# Patient Record
Sex: Female | Born: 1955 | Race: White | Hispanic: No | Smoking: Former smoker
Health system: Southern US, Community
[De-identification: ages and names within clinical notes are randomized; demographics above are authoritative.]

## PROBLEM LIST (undated history)

## (undated) ENCOUNTER — Emergency Department: Disposition: A | Discharge status: Home / Self Care | Payer: Self-pay

## (undated) DIAGNOSIS — I351 Nonrheumatic aortic (valve) insufficiency: Secondary | ICD-10-CM

## (undated) DIAGNOSIS — M1990 Unspecified osteoarthritis, unspecified site: Secondary | ICD-10-CM

## (undated) DIAGNOSIS — I517 Cardiomegaly: Secondary | ICD-10-CM

## (undated) DIAGNOSIS — I10 Essential (primary) hypertension: Secondary | ICD-10-CM

## (undated) DIAGNOSIS — M545 Low back pain, unspecified: Secondary | ICD-10-CM

## (undated) DIAGNOSIS — M48061 Spinal stenosis, lumbar region without neurogenic claudication: Secondary | ICD-10-CM

## (undated) DIAGNOSIS — Z720 Tobacco use: Secondary | ICD-10-CM

## (undated) DIAGNOSIS — M797 Fibromyalgia: Secondary | ICD-10-CM

## (undated) DIAGNOSIS — E785 Hyperlipidemia, unspecified: Secondary | ICD-10-CM

## (undated) DIAGNOSIS — I340 Nonrheumatic mitral (valve) insufficiency: Secondary | ICD-10-CM

## (undated) DIAGNOSIS — F419 Anxiety disorder, unspecified: Secondary | ICD-10-CM

## (undated) DIAGNOSIS — J439 Emphysema, unspecified: Secondary | ICD-10-CM

## (undated) DIAGNOSIS — E559 Vitamin D deficiency, unspecified: Secondary | ICD-10-CM

## (undated) DIAGNOSIS — F329 Major depressive disorder, single episode, unspecified: Secondary | ICD-10-CM

## (undated) DIAGNOSIS — K589 Irritable bowel syndrome without diarrhea: Secondary | ICD-10-CM

## (undated) DIAGNOSIS — I519 Heart disease, unspecified: Secondary | ICD-10-CM

## (undated) DIAGNOSIS — F32A Depression, unspecified: Secondary | ICD-10-CM

## (undated) HISTORY — DX: Spinal stenosis, lumbar region without neurogenic claudication: M48.061

## (undated) HISTORY — DX: Nonrheumatic mitral (valve) insufficiency: I34.0

## (undated) HISTORY — DX: Tobacco use: Z72.0

## (undated) HISTORY — DX: Nonrheumatic aortic (valve) insufficiency: I35.1

## (undated) HISTORY — DX: Depression, unspecified: F32.A

## (undated) HISTORY — DX: Low back pain: M54.5

## (undated) HISTORY — DX: Emphysema, unspecified: J43.9

## (undated) HISTORY — DX: Anxiety disorder, unspecified: F41.9

## (undated) HISTORY — DX: Unspecified osteoarthritis, unspecified site: M19.90

## (undated) HISTORY — DX: Vitamin D deficiency, unspecified: E55.9

## (undated) HISTORY — DX: Cardiomegaly: I51.7

## (undated) HISTORY — DX: Major depressive disorder, single episode, unspecified: F32.9

## (undated) HISTORY — DX: Low back pain, unspecified: M54.50

## (undated) HISTORY — DX: Fibromyalgia: M79.7

## (undated) HISTORY — DX: Heart disease, unspecified: I51.9

## (undated) HISTORY — DX: Hyperlipidemia, unspecified: E78.5

---
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
## 1997-08-21 HISTORY — PX: LAPAROSCOPIC ABDOMINAL EXPLORATION: SHX6249

## 1998-08-21 HISTORY — PX: ABDOMINAL HYSTERECTOMY: SHX81

## 2007-10-01 ENCOUNTER — Emergency Department: Payer: Self-pay | Admitting: Emergency Medicine

## 2012-10-09 DIAGNOSIS — F419 Anxiety disorder, unspecified: Secondary | ICD-10-CM

## 2012-10-09 HISTORY — DX: Anxiety disorder, unspecified: F41.9

## 2013-10-20 ENCOUNTER — Emergency Department: Payer: Self-pay | Admitting: Emergency Medicine

## 2013-10-20 LAB — CBC
HCT: 42.5 % (ref 35.0–47.0)
HGB: 14 g/dL (ref 12.0–16.0)
MCH: 30.4 pg (ref 26.0–34.0)
MCHC: 32.9 g/dL (ref 32.0–36.0)
MCV: 92 fL (ref 80–100)
PLATELETS: 346 10*3/uL (ref 150–440)
RBC: 4.6 10*6/uL (ref 3.80–5.20)
RDW: 13.5 % (ref 11.5–14.5)
WBC: 10.9 10*3/uL (ref 3.6–11.0)

## 2013-10-20 LAB — BASIC METABOLIC PANEL
Anion Gap: 8 (ref 7–16)
BUN: 18 mg/dL (ref 7–18)
CALCIUM: 8.7 mg/dL (ref 8.5–10.1)
CREATININE: 0.63 mg/dL (ref 0.60–1.30)
Chloride: 109 mmol/L — ABNORMAL HIGH (ref 98–107)
Co2: 21 mmol/L (ref 21–32)
EGFR (Non-African Amer.): >60
Glucose: 86 mg/dL (ref 65–99)
Osmolality: 277 (ref 275–301)
Potassium: 4.7 mmol/L (ref 3.5–5.1)
Sodium: 138 mmol/L (ref 136–145)

## 2013-10-24 ENCOUNTER — Emergency Department: Payer: Self-pay | Admitting: Emergency Medicine

## 2013-10-24 LAB — BASIC METABOLIC PANEL
ANION GAP: 6 — AB (ref 7–16)
BUN: 11 mg/dL (ref 7–18)
CO2: 23 mmol/L (ref 21–32)
Calcium, Total: 8.1 mg/dL — ABNORMAL LOW (ref 8.5–10.1)
Chloride: 108 mmol/L — ABNORMAL HIGH (ref 98–107)
Creatinine: 0.89 mg/dL (ref 0.60–1.30)
EGFR (African American): >60
GLUCOSE: 87 mg/dL (ref 65–99)
Osmolality: 273 (ref 275–301)
POTASSIUM: 4 mmol/L (ref 3.5–5.1)
SODIUM: 137 mmol/L (ref 136–145)

## 2013-10-24 LAB — CBC WITH DIFFERENTIAL/PLATELET
Basophil #: 0.1 10*3/uL (ref 0.0–0.1)
Basophil %: 0.8 %
Eosinophil #: 0 10*3/uL (ref 0.0–0.7)
Eosinophil %: 0.4 %
HCT: 41 % (ref 35.0–47.0)
HGB: 14 g/dL (ref 12.0–16.0)
LYMPHS ABS: 0.4 10*3/uL — AB (ref 1.0–3.6)
LYMPHS PCT: 5.8 %
MCH: 31.9 pg (ref 26.0–34.0)
MCHC: 34.3 g/dL (ref 32.0–36.0)
MCV: 93 fL (ref 80–100)
MONO ABS: 0.6 x10 3/mm (ref 0.2–0.9)
Monocyte %: 8.3 %
Neutrophil #: 6 10*3/uL (ref 1.4–6.5)
Neutrophil %: 84.7 %
Platelet: 266 10*3/uL (ref 150–440)
RBC: 4.41 10*6/uL (ref 3.80–5.20)
RDW: 13.4 % (ref 11.5–14.5)
WBC: 7.1 10*3/uL (ref 3.6–11.0)

## 2013-10-24 LAB — TROPONIN I

## 2014-02-26 ENCOUNTER — Ambulatory Visit: Payer: Self-pay | Admitting: Family Medicine

## 2014-04-17 ENCOUNTER — Emergency Department: Payer: Self-pay | Admitting: Student

## 2014-04-17 LAB — COMPREHENSIVE METABOLIC PANEL
ALT: 24 U/L
ANION GAP: 5 — AB (ref 7–16)
Albumin: 3.7 g/dL (ref 3.4–5.0)
Alkaline Phosphatase: 84 U/L
BUN: 19 mg/dL — AB (ref 7–18)
Bilirubin,Total: 0.4 mg/dL (ref 0.2–1.0)
CHLORIDE: 110 mmol/L — AB (ref 98–107)
CO2: 26 mmol/L (ref 21–32)
CREATININE: 1.01 mg/dL (ref 0.60–1.30)
Calcium, Total: 9.1 mg/dL (ref 8.5–10.1)
EGFR (African American): >60
Glucose: 114 mg/dL — ABNORMAL HIGH (ref 65–99)
Osmolality: 284 (ref 275–301)
POTASSIUM: 4.2 mmol/L (ref 3.5–5.1)
SGOT(AST): 25 U/L (ref 15–37)
SODIUM: 141 mmol/L (ref 136–145)
Total Protein: 7.4 g/dL (ref 6.4–8.2)

## 2014-04-17 LAB — CBC
HCT: 40 % (ref 35.0–47.0)
HGB: 13.1 g/dL (ref 12.0–16.0)
MCH: 30.3 pg (ref 26.0–34.0)
MCHC: 32.8 g/dL (ref 32.0–36.0)
MCV: 93 fL (ref 80–100)
Platelet: 319 10*3/uL (ref 150–440)
RBC: 4.33 10*6/uL (ref 3.80–5.20)
RDW: 13 % (ref 11.5–14.5)
WBC: 7.9 10*3/uL (ref 3.6–11.0)

## 2014-04-17 LAB — SEDIMENTATION RATE: Erythrocyte Sed Rate: 21 mm/hr (ref 0–30)

## 2014-05-15 ENCOUNTER — Ambulatory Visit: Payer: Self-pay | Admitting: Neurology

## 2015-01-01 ENCOUNTER — Other Ambulatory Visit: Payer: Self-pay | Admitting: Physician Assistant

## 2015-01-01 DIAGNOSIS — M545 Low back pain, unspecified: Secondary | ICD-10-CM

## 2015-01-11 ENCOUNTER — Ambulatory Visit
Admission: RE | Admit: 2015-01-11 | Discharge: 2015-01-11 | Disposition: A | Discharge status: Home / Self Care | Payer: Medicare Other | Source: Ambulatory Visit | Attending: Physician Assistant | Admitting: Physician Assistant

## 2015-02-26 DIAGNOSIS — E785 Hyperlipidemia, unspecified: Secondary | ICD-10-CM | POA: Insufficient documentation

## 2015-02-26 DIAGNOSIS — E559 Vitamin D deficiency, unspecified: Secondary | ICD-10-CM | POA: Insufficient documentation

## 2015-02-26 DIAGNOSIS — M545 Low back pain, unspecified: Secondary | ICD-10-CM | POA: Insufficient documentation

## 2015-02-26 DIAGNOSIS — M797 Fibromyalgia: Secondary | ICD-10-CM | POA: Insufficient documentation

## 2015-03-03 ENCOUNTER — Encounter: Payer: Self-pay | Admitting: Family Medicine

## 2015-03-03 ENCOUNTER — Ambulatory Visit (INDEPENDENT_AMBULATORY_CARE_PROVIDER_SITE_OTHER): Payer: Medicare Other | Admitting: Family Medicine

## 2015-03-03 VITALS — BP 121/83 | HR 87 | Temp 98.3°F | Ht 65.0 in | Wt 205.0 lb

## 2015-03-03 DIAGNOSIS — M545 Low back pain, unspecified: Secondary | ICD-10-CM

## 2015-03-03 DIAGNOSIS — Z5181 Encounter for therapeutic drug level monitoring: Secondary | ICD-10-CM | POA: Diagnosis not present

## 2015-03-03 DIAGNOSIS — R946 Abnormal results of thyroid function studies: Secondary | ICD-10-CM | POA: Diagnosis not present

## 2015-03-03 DIAGNOSIS — E785 Hyperlipidemia, unspecified: Secondary | ICD-10-CM

## 2015-03-03 DIAGNOSIS — E669 Obesity, unspecified: Secondary | ICD-10-CM | POA: Diagnosis not present

## 2015-03-03 DIAGNOSIS — Z87891 Personal history of nicotine dependence: Secondary | ICD-10-CM | POA: Insufficient documentation

## 2015-03-03 DIAGNOSIS — M797 Fibromyalgia: Secondary | ICD-10-CM | POA: Diagnosis not present

## 2015-03-03 DIAGNOSIS — R7989 Other specified abnormal findings of blood chemistry: Secondary | ICD-10-CM | POA: Insufficient documentation

## 2015-03-03 DIAGNOSIS — Z720 Tobacco use: Secondary | ICD-10-CM

## 2015-03-03 DIAGNOSIS — E559 Vitamin D deficiency, unspecified: Secondary | ICD-10-CM

## 2015-03-03 NOTE — Assessment & Plan Note (Signed)
Check lipids today; continue modest weight loss; increase activity as tolerated;

## 2015-03-03 NOTE — Progress Notes (Signed)
BP 121/83 mmHg  Pulse 87  Temp(Src) 98.3 F (36.8 C)  Ht 5' 5"  (1.651 m)  Wt 205 lb (92.987 kg)  BMI 34.11 kg/m2  SpO2 95%   Subjective:    Patient ID: Jenna Meyers, female    DOB: 1956/06/21, 59 y.o.   MRN: [id number]  HPI: Jenna Meyers is a 59 y.o. female  Chief Complaint  Patient presents with  . Hyperlipidemia  . Hypertension  . OTHER    Check Vitamin D level   High cholesterol Cut out fast food Dietary intake: Do you eat more than 3 eggs per week  no Do you try to limit saturated fats in your diet?  NO; tries to stick with [place] sandwich; occasional hot dogs, [place] dogs, not often Exercise: Exercise/activity level:  sedentary (essential NO activity); does walk someWeight:  If overweight or obese, are you trying to lose weight?  yes  Hypertension Just had it for a year, well-controlled otherwise Checking blood pressure away from here?  no; used to, but not recently; they check it at pain doctor, stable Sudafed (decongestants): Do you use any OTC decongestant products like Allegra-D, Claritin-D, Zyrtec-D, Tylenol Cold and Sinus, etc.?  no  Chronic back pain; goes to pain clinic; getting mri soon; on medicine; might be able to have a procedure that deaden nerves; not able to exercise  Relevant past medical, surgical, family and social history reviewed and updated as indicated. Interim medical history since our last visit reviewed. Allergies and medications reviewed and updated.  Review of Systems Still sweating, did not go to endocrinologist Per HPI unless specifically indicated above     Objective:    BP 121/83 mmHg  Pulse 87  Temp(Src) 98.3 F (36.8 C)  Ht 5' 5"  (1.651 m)  Wt 205 lb (92.987 kg)  BMI 34.11 kg/m2  SpO2 95%  Wt Readings from Last 3 Encounters:  03/03/15 205 lb (92.987 kg)  05/05/14 214 lb (97.07 kg)    Physical Exam  Constitutional: She appears well-developed and well-nourished. No distress.  HENT:  Head: Normocephalic and  atraumatic.  Eyes: EOM are normal. No scleral icterus.  Neck: No thyromegaly present.  Cardiovascular: Normal rate, regular rhythm and normal heart sounds.   No murmur heard. Pulmonary/Chest: Effort normal and breath sounds normal. No respiratory distress. She has no wheezes.  Abdominal: Soft. Bowel sounds are normal. She exhibits no distension.  Musculoskeletal: Normal range of motion. She exhibits no edema.  Neurological: She is alert. She exhibits normal muscle tone.  Skin: Skin is warm and dry. She is not diaphoretic. No pallor.  Psychiatric: She has a normal mood and affect. Her behavior is normal. Judgment and thought content normal.    Results for orders placed or performed in visit on 04/17/14  CBC  Result Value Ref Range   WBC 7.9 3.6-11.0 x10 3/mm 3   RBC 4.33 3.80-5.20 X10 6/mm 3   HGB 13.1 12.0-16.0 g/dL   HCT 40.0 35.0-47.0 %   MCV 93 80-100 fL   MCH 30.3 26.0-34.0 pg   MCHC 32.8 32.0-36.0 g/dL   RDW 13.0 11.5-14.5 %   Platelet 319 150-440 x10 3/mm 3  Comprehensive metabolic panel  Result Value Ref Range   Glucose 114 (H) 65-99 mg/dL   BUN 19 (H) 7-18 mg/dL   Creatinine 1.01 0.60-1.30 mg/dL   Sodium 141 136-145 mmol/L   Potassium 4.2 3.5-5.1 mmol/L   Chloride 110 (H) 98-107 mmol/L   Co2 26 21-32 mmol/L   Calcium, Total  9.1 8.5-10.1 mg/dL   SGOT(AST) 25 15-37 Unit/L   SGPT (ALT) 24 U/L   Alkaline Phosphatase 84 Unit/L   Albumin 3.7 3.4-5.0 g/dL   Total Protein 7.4 6.4-8.2 g/dL   Bilirubin,Total 0.4 0.2-1.0 mg/dL   Osmolality 284 275-301   Anion Gap 5 (L) 7-16   EGFR (African American) >60    EGFR (Non-African Amer.) >60   Sedimentation rate  Result Value Ref Range   Erythrocyte Sed Rate 21 0-30 mm/hr      Assessment & Plan:   Problem List Items Addressed This Visit      Musculoskeletal and Integument   Fibromyalgia    Sees pain clinic; on cymbalta        Other   Hyperlipidemia - Primary    Check lipids today; continue modest weight loss;  increase activity as tolerated;       Relevant Orders   Lipid Panel w/o Chol/HDL Ratio   Lumbago    Sees pain clinic      Vitamin D deficiency disease    Check level today and supplement if needed      Relevant Orders   Vit D  25 hydroxy (rtn osteoporosis monitoring)   Obesity    Drink 64 ounces of caffeine-free beverages daily; check TSH      Abnormal thyroid blood test    Check TSH today      Relevant Orders   TSH   Tobacco abuse    Encouraged patient to quit smoking; number to smoking cessation classes given, QUIT-NOW number given, and tips provided       Other Visit Diagnoses    Medication monitoring encounter        Relevant Orders    Comprehensive metabolic panel       An After Visit Summary was printed and given to the patient.  Orders Placed This Encounter  Procedures  . TSH  . Vit D  25 hydroxy (rtn osteoporosis monitoring)  . Comprehensive metabolic panel    Order Specific Question:  Has the patient fasted?    Answer:  Yes  . Lipid Panel w/o Chol/HDL Ratio    Order Specific Question:  Has the patient fasted?    Answer:  Yes    Follow up plan: Return in about 6 months (around 09/03/2015) for follow-up and physical when due.

## 2015-03-03 NOTE — Assessment & Plan Note (Signed)
Drink 64 ounces of caffeine-free beverages daily; check TSH

## 2015-03-03 NOTE — Assessment & Plan Note (Signed)
Encouraged patient to quit smoking; number to smoking cessation classes given, QUIT-NOW number given, and tips provided

## 2015-03-03 NOTE — Patient Instructions (Addendum)
Check out the information at familydoctor.org entitled "What It Takes to Lose Weight" Try to lose between 1-2 pounds per week by taking in fewer calories and burning off more calories You can succeed by limiting portions, limiting foods dense in calories and fat, becoming more active, and drinking 8 glasses of water a day Don't skip meals, especially breakfast, as skipping meals may alter your metabolism Do not use over-the-counter weight loss pills or gimmicks that claim rapid weight loss A healthy BMI (or body mass index) is between 18.5 and 24.9 You can calculate your ideal BMI at the NIH website [employer] Try to limit saturated fats (bacon, sausage, hot dogs, cheese, etc.) Return in 6 months for next follow-up Return for next physical when due I do encourage you to quit smoking Call [phone] to sign up for smoking cessation classes You can call 1-800-QUIT-NOW to talk with a smoking cessation coach   Smoking Cessation, Tips for Success If you are ready to quit smoking, congratulations! You have chosen to help yourself be healthier. Cigarettes bring nicotine, tar, carbon monoxide, and other irritants into your body. Your lungs, heart, and blood vessels will be able to work better without these poisons. There are many different ways to quit smoking. Nicotine gum, nicotine patches, a nicotine inhaler, or nicotine nasal spray can help with physical craving. Hypnosis, support groups, and medicines help break the habit of smoking. WHAT THINGS CAN I DO TO MAKE QUITTING EASIER?  Here are some tips to help you quit for good:  Pick a date when you will quit smoking completely. Tell all of your friends and family about your plan to quit on that date.  Do not try to slowly cut down on the number of cigarettes you are smoking. Pick a quit date and quit smoking completely starting on that day.  Throw away all cigarettes.   Clean and remove  all ashtrays from your home, work, and car.  On a card, write down your reasons for quitting. Carry the card with you and read it when you get the urge to smoke.  Cleanse your body of nicotine. Drink enough water and fluids to keep your urine clear or pale yellow. Do this after quitting to flush the nicotine from your body.  Learn to predict your moods. Do not let a bad situation be your excuse to have a cigarette. Some situations in your life might tempt you into wanting a cigarette.  Never have "just one" cigarette. It leads to wanting another and another. Remind yourself of your decision to quit.  Change habits associated with smoking. If you smoked while driving or when feeling stressed, try other activities to replace smoking. Stand up when drinking your coffee. Brush your teeth after eating. Sit in a different chair when you read the paper. Avoid alcohol while trying to quit, and try to drink fewer caffeinated beverages. Alcohol and caffeine may urge you to smoke.  Avoid foods and drinks that can trigger a desire to smoke, such as sugary or spicy foods and alcohol.  Ask people who smoke not to smoke around you.  Have something planned to do right after eating or having a cup of coffee. For example, plan to take a walk or exercise.  Try a relaxation exercise to calm you down and decrease your stress. Remember, you may be tense and nervous for the first 2 weeks after you quit, but this will pass.  Find new activities to keep your hands busy. Play with a pen,  coin, or rubber band. Doodle or draw things on paper.  Brush your teeth right after eating. This will help cut down on the craving for the taste of tobacco after meals. You can also try mouthwash.   Use oral substitutes in place of cigarettes. Try using lemon drops, carrots, cinnamon sticks, or chewing gum. Keep them handy so they are available when you have the urge to smoke.  When you have the urge to smoke, try deep  breathing.  Designate your home as a nonsmoking area.  If you are a heavy smoker, ask your health care provider about a prescription for nicotine chewing gum. It can ease your withdrawal from nicotine.  Reward yourself. Set aside the cigarette money you save and buy yourself something nice.  Look for support from others. Join a support group or smoking cessation program. Ask someone at home or at work to help you with your plan to quit smoking.  Always ask yourself, "Do I need this cigarette or is this just a reflex?" Tell yourself, "Today, I choose not to smoke," or "I do not want to smoke." You are reminding yourself of your decision to quit.  Do not replace cigarette smoking with electronic cigarettes (commonly called e-cigarettes). The safety of e-cigarettes is unknown, and some may contain harmful chemicals.  If you relapse, do not give up! Plan ahead and think about what you will do the next time you get the urge to smoke. HOW WILL I FEEL WHEN I QUIT SMOKING? You may have symptoms of withdrawal because your body is used to nicotine (the addictive substance in cigarettes). You may crave cigarettes, be irritable, feel very hungry, cough often, get headaches, or have difficulty concentrating. The withdrawal symptoms are only temporary. They are strongest when you first quit but will go away within 10-14 days. When withdrawal symptoms occur, stay in control. Think about your reasons for quitting. Remind yourself that these are signs that your body is healing and getting used to being without cigarettes. Remember that withdrawal symptoms are easier to treat than the major diseases that smoking can cause.  Even after the withdrawal is over, expect periodic urges to smoke. However, these cravings are generally short lived and will go away whether you smoke or not. Do not smoke! WHAT RESOURCES ARE AVAILABLE TO HELP ME QUIT SMOKING? Your health care provider can direct you to community resources or  hospitals for support, which may include:  Group support.  Education.  Hypnosis.  Therapy. Document Released: 05/05/2004 Document Revised: 12/22/2013 Document Reviewed: 01/23/2013 [facility] Patient Information 2015 [place], [place]. This information is not intended to replace advice given to you by your health care provider. Make sure you discuss any questions you have with your health care provider.

## 2015-03-03 NOTE — Assessment & Plan Note (Signed)
Check level today and supplement if needed 

## 2015-03-03 NOTE — Assessment & Plan Note (Signed)
Sees pain clinic.  

## 2015-03-03 NOTE — Assessment & Plan Note (Signed)
Sees pain clinic; on cymbalta

## 2015-03-03 NOTE — Assessment & Plan Note (Signed)
Check TSH today

## 2015-03-31 ENCOUNTER — Encounter: Payer: Medicare Other | Admitting: Family Medicine

## 2015-05-14 ENCOUNTER — Emergency Department
Admission: EM | Admit: 2015-05-14 | Discharge: 2015-05-14 | Disposition: A | Discharge status: Home / Self Care | Payer: Medicare Other | Attending: Emergency Medicine | Admitting: Emergency Medicine

## 2015-05-14 DIAGNOSIS — Z7952 Long term (current) use of systemic steroids: Secondary | ICD-10-CM | POA: Insufficient documentation

## 2015-05-14 DIAGNOSIS — Z79899 Other long term (current) drug therapy: Secondary | ICD-10-CM | POA: Insufficient documentation

## 2015-05-14 DIAGNOSIS — M549 Dorsalgia, unspecified: Secondary | ICD-10-CM | POA: Insufficient documentation

## 2015-05-14 DIAGNOSIS — Y9289 Other specified places as the place of occurrence of the external cause: Secondary | ICD-10-CM | POA: Diagnosis not present

## 2015-05-14 DIAGNOSIS — G8929 Other chronic pain: Secondary | ICD-10-CM | POA: Insufficient documentation

## 2015-05-14 DIAGNOSIS — T7840XA Allergy, unspecified, initial encounter: Secondary | ICD-10-CM

## 2015-05-14 DIAGNOSIS — X58XXXA Exposure to other specified factors, initial encounter: Secondary | ICD-10-CM | POA: Insufficient documentation

## 2015-05-14 DIAGNOSIS — Y9389 Activity, other specified: Secondary | ICD-10-CM | POA: Diagnosis not present

## 2015-05-14 DIAGNOSIS — Z720 Tobacco use: Secondary | ICD-10-CM | POA: Insufficient documentation

## 2015-05-14 DIAGNOSIS — Y998 Other external cause status: Secondary | ICD-10-CM | POA: Diagnosis not present

## 2015-05-14 MED ORDER — DEXAMETHASONE SODIUM PHOSPHATE 10 MG/ML IJ SOLN
10.0000 mg | Freq: Once | INTRAMUSCULAR | Status: AC
  Administered 2015-05-14: 10 mg via INTRAVENOUS
  Filled 2015-05-14: qty 1

## 2015-05-14 MED ORDER — SODIUM CHLORIDE 0.9 % IV BOLUS (SEPSIS)
1000.0000 mL | Freq: Once | INTRAVENOUS | Status: AC
  Administered 2015-05-14: 1000 mL via INTRAVENOUS

## 2015-05-14 MED ORDER — PREDNISONE 10 MG (21) PO TBPK: ORAL_TABLET | ORAL | Status: DC

## 2015-05-14 MED ORDER — DIPHENHYDRAMINE HCL 12.5 MG/5ML PO ELIX
12.5000 mg | ORAL_SOLUTION | Freq: Once | ORAL | Status: AC
  Administered 2015-05-14: 12.5 mg via ORAL
  Filled 2015-05-14: qty 5

## 2015-05-14 MED ORDER — FAMOTIDINE IN NACL 20-0.9 MG/50ML-% IV SOLN
INTRAVENOUS | Status: AC
  Administered 2015-05-14: 20 mg via INTRAVENOUS
  Filled 2015-05-14: qty 50

## 2015-05-14 MED ORDER — FAMOTIDINE IN NACL 20-0.9 MG/50ML-% IV SOLN
20.0000 mg | Freq: Once | INTRAVENOUS | Status: AC
  Administered 2015-05-14: 20 mg via INTRAVENOUS

## 2015-05-14 MED ORDER — DIPHENHYDRAMINE HCL 25 MG PO CAPS: 25.0000 mg | ORAL_CAPSULE | ORAL | Status: DC | PRN

## 2015-05-14 MED ORDER — FAMOTIDINE 20 MG PO TABS: 20.0000 mg | ORAL_TABLET | Freq: Every day | ORAL | Status: DC

## 2015-05-14 NOTE — Discharge Instructions (Signed)
Allergies Allergies may happen from anything your body is sensitive to. This may be food, medicines, pollens, chemicals, and nearly anything around you in everyday life that produces allergens. An allergen is anything that causes an allergy producing substance. Heredity is often a factor in causing these problems. This means you may have some of the same allergies as your parents. Food allergies happen in all age groups. Food allergies are some of the most severe and life threatening. Some common food allergies are cow's milk, seafood, eggs, nuts, wheat, and soybeans. SYMPTOMS   Swelling around the mouth.  An itchy red rash or hives.  Vomiting or diarrhea.  Difficulty breathing. SEVERE ALLERGIC REACTIONS ARE LIFE-THREATENING. This reaction is called anaphylaxis. It can cause the mouth and throat to swell and cause difficulty with breathing and swallowing. In severe reactions only a trace amount of food (for example, peanut oil in a salad) may cause death within seconds. Seasonal allergies occur in all age groups. These are seasonal because they usually occur during the same season every year. They may be a reaction to molds, grass pollens, or tree pollens. Other causes of problems are house dust mite allergens, pet dander, and mold spores. The symptoms often consist of nasal congestion, a runny itchy nose associated with sneezing, and tearing itchy eyes. There is often an associated itching of the mouth and ears. The problems happen when you come in contact with pollens and other allergens. Allergens are the particles in the air that the body reacts to with an allergic reaction. This causes you to release allergic antibodies. Through a chain of events, these eventually cause you to release histamine into the blood stream. Although it is meant to be protective to the body, it is this release that causes your discomfort. This is why you were given anti-histamines to feel better. If you are unable to  pinpoint the offending allergen, it may be determined by skin or blood testing. Allergies cannot be cured but can be controlled with medicine. Hay fever is a collection of all or some of the seasonal allergy problems. It may often be treated with simple over-the-counter medicine such as diphenhydramine. Take medicine as directed. Do not drink alcohol or drive while taking this medicine. Check with your caregiver or package insert for child dosages. If these medicines are not effective, there are many new medicines your caregiver can prescribe. Stronger medicine such as nasal spray, eye drops, and corticosteroids may be used if the first things you try do not work well. Other treatments such as immunotherapy or desensitizing injections can be used if all else fails. Follow up with your caregiver if problems continue. These seasonal allergies are usually not life threatening. They are generally more of a nuisance that can often be handled using medicine. HOME CARE INSTRUCTIONS   If unsure what causes a reaction, keep a diary of foods eaten and symptoms that follow. Avoid foods that cause reactions.  If hives or rash are present:  Take medicine as directed.  You may use an over-the-counter antihistamine (diphenhydramine) for hives and itching as needed.  Apply cold compresses (cloths) to the skin or take baths in cool water. Avoid hot baths or showers. Heat will make a rash and itching worse.  If you are severely allergic:  Following a treatment for a severe reaction, hospitalization is often required for closer follow-up.  Wear a medic-alert bracelet or necklace stating the allergy.  You and your family must learn how to give adrenaline or use  an anaphylaxis kit. °¨ If you have had a severe reaction, always carry your anaphylaxis kit or EpiPen® with you. Use this medicine as directed by your caregiver if a severe reaction is occurring. Failure to do so could have a fatal outcome. °SEEK MEDICAL  CARE IF: °· You suspect a food allergy. Symptoms generally happen within 30 minutes of eating a food. °· Your symptoms have not gone away within 2 days or are getting worse. °· You develop new symptoms. °· You want to retest yourself or your child with a food or drink you think causes an allergic reaction. Never do this if an anaphylactic reaction to that food or drink has happened before. Only do this under the care of a caregiver. °SEEK IMMEDIATE MEDICAL CARE IF:  °· You have difficulty breathing, are wheezing, or have a tight feeling in your chest or throat. °· You have a swollen mouth, or you have hives, swelling, or itching all over your body. °· You have had a severe reaction that has responded to your anaphylaxis kit or an EpiPen®. These reactions may return when the medicine has worn off. These reactions should be considered life threatening. °MAKE SURE YOU:  °· Understand these instructions. °· Will watch your condition. °· Will get help right away if you are not doing well or get worse. °Document Released: 10/31/2002 Document Revised: 12/02/2012 Document Reviewed: 04/06/2008 °Exit[employer]® Patient Information ©2015 [employer]. This information is not intended to replace advice given to you by your health care provider. Make sure you discuss any questions you have with your health care provider. ° °

## 2015-05-14 NOTE — ED Notes (Signed)
Pt states that she woke up at noon feeling a burning sensation to her arms and noticed they were red, then her face and abd and now her legs feel this way with hives, pt denies hx of allergic reaction, pt states that she is not having diff breathing or talking and doesn't feel like her throat or tongue is affected, pt denies taking any meds prior to arrival

## 2015-05-14 NOTE — ED Provider Notes (Signed)
[facility] Emergency Department Provider Note  ____________________________________________  Time seen: Approximately 4:00 PM  I have reviewed the triage vital signs and the nursing notes.   HISTORY  Chief Complaint Allergic Reaction    HPI Jenna Meyers is a 59 y.o. female presenting with itching to her whole body since she woke up at noon today. Patient first noticed a burning sensation to her bilateral elbows that felt warm to the touch and appeared erythematous. She then noticed that the sensation switched from a burning sensation to itching. The redness and itching spread from her elbows to her entire body including her face. She denies SOB or tongue/throat swelling. Patient did not take anything to relieve the itching, but came straight to the ED. Patient states her head felt "a little fuzzy" earlier but that she does not feel that way anymore. Patient denies fever, chills, nausea, vomiting, diarrhea, and constipation. Patient has no known allergies and no history of allergic reactions. Patient denies any new products, medications, insect bites, or trauma. Patient has not travelled recently.     Past Medical History  Diagnosis Date  . Hyperlipidemia   . Arthritis     back  . Lumbago   . Fibromyalgia   . Vitamin D deficiency disease   . Depression   . Anxiety   . History of tobacco abuse     Patient Active Problem List   Diagnosis Date Noted  . Obesity 03/03/2015  . Abnormal thyroid blood test 03/03/2015  . Tobacco abuse 03/03/2015  . Hyperlipidemia   . Lumbago   . Fibromyalgia   . Vitamin D deficiency disease     Past Surgical History  Procedure Laterality Date  . Tonsillectomy and adenoidectomy  1962  . Cesarean section  1996  . Laparoscopic abdominal exploration  1999  . Abdominal hysterectomy  2000    partial, still has cervix    Current Outpatient Rx  Name  Route  Sig  Dispense  Refill  . atorvastatin (LIPITOR) 80 MG tablet    Oral   Take 80 mg by mouth at bedtime.         . cholecalciferol (VITAMIN D) 1000 UNITS tablet   Oral   Take 1,000 Units by mouth daily.         . diazepam (VALIUM) 5 MG tablet   Oral   Take 5 mg by mouth.      2   . diphenhydrAMINE (BENADRYL) 25 mg capsule   Oral   Take 1 capsule (25 mg total) by mouth every 4 (four) hours as needed.   30 capsule   0   . DULoxetine (CYMBALTA) 60 MG capsule   Oral   Take 120 mg by mouth daily.         . famotidine (PEPCID) 20 MG tablet   Oral   Take 1 tablet (20 mg total) by mouth daily.   7 tablet   0   . predniSONE (STERAPRED UNI-PAK 21 TAB) 10 MG (21) TBPK tablet      Starting tomorrow, 05/15/15. Take 6 tablets on day 1 Take 5 tablets on day 2 Take 4 tablets on day 3 Take 3 tablets on day 4 Take 2 tablets on day 5 Take 1 tablet on day 6   21 tablet   0   . Tapentadol HCl (NUCYNTA ER) 100 MG TB12   Oral   Take 100 mg by mouth 2 (two) times daily.         Marland Kitchen  traZODone (DESYREL) 50 MG tablet   Oral   Take 50 mg by mouth at bedtime as needed.           Allergies Review of patient's allergies indicates no known allergies.  Family History  Problem Relation Age of Onset  . Cancer Mother     colon  . Osteoporosis Mother   . Hyperlipidemia Mother   . Alzheimer's disease Father   . Thyroid disease Son   . Heart disease Brother     Social History Social History  Substance Use Topics  . Smoking status: Current Every Day Smoker -- 1.00 packs/day    Types: Cigarettes  . Smokeless tobacco: Never Used  . Alcohol Use: No    Review of Systems Constitutional: No fever/chills Eyes: No visual changes. ENT: No sore throat. Cardiovascular: Denies chest pain. Respiratory: Denies shortness of breath. Gastrointestinal: No abdominal pain.  No nausea, no vomiting.  No diarrhea.  No constipation. Genitourinary: Negative for dysuria. Musculoskeletal: Positive for chronic back pain. Skin: Positive for rash and  itching. Neurological: Negative for headaches, focal weakness or numbness. Hematological/Lymphatic:no easy bruising/bleeding Allergic/Immunilogical: no known allergies 10-point ROS otherwise negative.  ____________________________________________   PHYSICAL EXAM:  VITAL SIGNS: ED Triage Vitals  Enc Vitals Group     BP 05/14/15 1351 124/83 mmHg     Pulse Rate 05/14/15 1351 92     Resp 05/14/15 1351 18     Temp 05/14/15 1351 97.7 F (36.5 C)     Temp Source 05/14/15 1351 Oral     SpO2 05/14/15 1351 100 %     Weight 05/14/15 1351 200 lb (90.719 kg)     Height 05/14/15 1351  (1.626 m)     Head Cir --      Peak Flow --      Pain Score --      Pain Loc --      Pain Edu? --      Excl. in GC? --     Constitutional: Alert and oriented. Well appearing and in no acute distress. Patient is scratching legs and arms upon entry to the room.  Eyes: Conjunctivae are normal. PERRL. EOMI. Head: Atraumatic. Nose: No congestion/rhinnorhea. Mouth/Throat: Mucous membranes are moist.  Oropharynx non-erythematous. Neck: No stridor.   Hematological/Lymphatic/Immunilogical: No cervical lymphadenopathy. Cardiovascular: Normal rate, regular rhythm. Grossly normal heart sounds.  Good peripheral circulation. Respiratory: Normal respiratory effort.  No retractions. Lungs CTAB. Gastrointestinal: Soft and nontender. No distention. No abdominal bruits. No CVA tenderness. Musculoskeletal: No lower extremity tenderness nor edema.  No joint effusions. Neurologic:  Normal speech and language. No gross focal neurologic deficits are appreciated. No gait instability. Skin:  Skin is dry and intact. Skin is warm to the touch, especially over bilateral arm.  Fine maculopapular rash with erythema noted to arms, legs, chest, and abdomen. No swelling noted. Psychiatric: Mood and affect are normal. Speech and behavior are normal.  ____________________________________________   LABS (all labs ordered are  listed, but only abnormal results are displayed)  Labs Reviewed - No data to display ____________________________________________  EKG  None ____________________________________________  RADIOLOGY  None ____________________________________________   PROCEDURES  Procedure(s) performed: None  Critical Care performed: No  ____________________________________________   INITIAL IMPRESSION / ASSESSMENT AND PLAN / ED COURSE  Pertinent labs & imaging results that were available during my care of the patient were reviewed by me and considered in my medical decision making (see chart for details).  Patient was given IV Decadron, Benadryl, and Pepcid  as well as normal saline while in the emergency department. She expressed relief of symptoms. She was monitored for approximately an hour without any worsening. She requested discharge. She was given strict return precautions. She will start prednisone taper tomorrow and continue the Pepcid and Benadryl. ____________________________________________   FINAL CLINICAL IMPRESSION(S) / ED DIAGNOSES  Final diagnoses:  Allergic reaction, initial encounter      Chinita Pester, FNP 05/14/15 1819  Myrna Blazer, MD 05/26/15 [phone]

## 2015-05-17 ENCOUNTER — Ambulatory Visit (INDEPENDENT_AMBULATORY_CARE_PROVIDER_SITE_OTHER): Payer: Medicare Other | Admitting: Family Medicine

## 2015-05-17 ENCOUNTER — Encounter: Payer: Self-pay | Admitting: Family Medicine

## 2015-05-17 VITALS — BP 166/90 | HR 81 | Temp 97.3°F | Wt 202.0 lb

## 2015-05-17 DIAGNOSIS — T7840XA Allergy, unspecified, initial encounter: Secondary | ICD-10-CM | POA: Diagnosis not present

## 2015-05-17 DIAGNOSIS — Z658 Other specified problems related to psychosocial circumstances: Secondary | ICD-10-CM | POA: Diagnosis not present

## 2015-05-17 DIAGNOSIS — M797 Fibromyalgia: Secondary | ICD-10-CM | POA: Diagnosis not present

## 2015-05-17 DIAGNOSIS — M545 Low back pain, unspecified: Secondary | ICD-10-CM

## 2015-05-17 DIAGNOSIS — E669 Obesity, unspecified: Secondary | ICD-10-CM

## 2015-05-17 DIAGNOSIS — I10 Essential (primary) hypertension: Secondary | ICD-10-CM | POA: Insufficient documentation

## 2015-05-17 DIAGNOSIS — F439 Reaction to severe stress, unspecified: Secondary | ICD-10-CM

## 2015-05-17 NOTE — Assessment & Plan Note (Signed)
See AVS; work on weight loss of 1 pound per week, 15 pounds or so before Jan visit

## 2015-05-17 NOTE — Assessment & Plan Note (Signed)
Taking cymbalta for that; continue current treatment; I do encourage gradually increasing activity program

## 2015-05-17 NOTE — Patient Instructions (Addendum)
Please do contact your social worker to see if you qualify for other services at this time Please do see a pain clinic specialist for your medicines Please do start working with a therapist for stress and coping mechanisms, and start cognitive behavioral therapy Do practice stress reduction Try the DASH guidelines Monitor blood pressure at pharmacy and let me know if top number over 140 or bottom number is over 90 Finish out the prednisone taper as directed  Limit saturated fats and try to eat healthy diet Work on weight loss Check out the information at [employer] entitled "What It Takes to Lose Weight" Try to lose between 1-2 pounds per week by taking in fewer calories and burning off more calories You can succeed by limiting portions, limiting foods dense in calories and fat, becoming more active, and drinking 8 glasses of water a day Don't skip meals, especially breakfast, as skipping meals may alter your metabolism Do not use over-the-counter weight loss pills or gimmicks that claim rapid weight loss A healthy BMI (or body mass index) is between 18.5 and 24.9 You can calculate your ideal BMI at the NIH website [employer]   Steps to Elicit the Relaxation Response The following is the technique reprinted with permission from Dr. Billy Fischer book The Relaxation Response pages 162-163 1. Sit quietly in a comfortable position. 2. Close your eyes. 3. Deeply relax all your muscles,  beginning at your feet and progressing up to your face.  Keep them relaxed. 4. Breathe through your nose.  Become aware of your breathing.  As you breathe out, say the word, "one"*,  silently to yourself. For example,  breathe in ... out, "one",- in .. out, "one", etc.  Breathe easily and naturally. 5. Continue for 10 to 20 minutes.  You may open your eyes to check the time, but do not use an alarm.  When you finish, sit quietly for  several minutes,  at first with your eyes closed and later with your eyes opened.  Do not stand up for a few minutes. 6. Do not worry about whether you are successful  in achieving a deep level of relaxation.  Maintain a passive attitude and permit relaxation to occur at its own pace.  When distracting thoughts occur,  try to ignore them by not dwelling upon them  and return to repeating "one."  With practice, the response should come with little effort.  Practice the technique once or twice daily,  but not within two hours after any meal,  since the digestive processes seem to interfere with  the elicitation of the Relaxation Response. * It is better to use a soothing, mellifluous sound, preferably with no meaning. or association, to avoid stimulation of unnecessary thoughts - a mantra.   DASH Eating Plan DASH stands for "Dietary Approaches to Stop Hypertension." The DASH eating plan is a healthy eating plan that has been shown to reduce high blood pressure (hypertension). Additional health benefits may include reducing the risk of type 2 diabetes mellitus, heart disease, and stroke. The DASH eating plan may also help with weight loss. WHAT DO I NEED TO KNOW ABOUT THE DASH EATING PLAN? For the DASH eating plan, you will follow these general guidelines:  Choose foods with a percent daily value for sodium of less than 5% (as listed on the food label).  Use salt-free seasonings or herbs instead of table salt or sea salt.  Check with your health care provider or pharmacist before using salt substitutes.  Eat lower-sodium products, often labeled as "lower sodium" or "no salt added."  Eat fresh foods.  Eat more vegetables, fruits, and low-fat dairy products.  Choose whole grains. Look for the word "whole" as the first word in the ingredient list.  Choose fish and skinless chicken or [place] more often than red meat. Limit fish, poultry, and meat to 6 oz (170 g) each day.  Limit  sweets, desserts, sugars, and sugary drinks.  Choose heart-healthy fats.  Limit cheese to 1 oz (28 g) per day.  Eat more home-cooked food and less restaurant, buffet, and fast food.  Limit fried foods.  Cook foods using methods other than frying.  Limit canned vegetables. If you do use them, rinse them well to decrease the sodium.  When eating at a restaurant, ask that your food be prepared with less salt, or no salt if possible. WHAT FOODS CAN I EAT? Seek help from a dietitian for individual calorie needs. Grains Whole grain or whole wheat bread. Brown rice. Whole grain or whole wheat pasta. Quinoa, bulgur, and whole grain cereals. Low-sodium cereals. Corn or whole wheat flour tortillas. Whole grain cornbread. Whole grain crackers. Low-sodium crackers. Vegetables Fresh or frozen vegetables (raw, steamed, roasted, or grilled). Low-sodium or reduced-sodium tomato and vegetable juices. Low-sodium or reduced-sodium tomato sauce and paste. Low-sodium or reduced-sodium canned vegetables.  Fruits All fresh, canned (in natural juice), or frozen fruits. Meat and Other Protein Products Ground beef (85% or leaner), grass-fed beef, or beef trimmed of fat. Skinless chicken or [place]. Ground chicken or [place]. Pork trimmed of fat. All fish and seafood. Eggs. Dried beans, peas, or lentils. Unsalted nuts and seeds. Unsalted canned beans. Dairy Low-fat dairy products, such as skim or 1% milk, 2% or reduced-fat cheeses, low-fat ricotta or cottage cheese, or plain low-fat yogurt. Low-sodium or reduced-sodium cheeses. Fats and Oils Tub margarines without trans fats. Light or reduced-fat mayonnaise and salad dressings (reduced sodium). Avocado. Safflower, olive, or canola oils. Natural peanut or almond butter. Other Unsalted popcorn and pretzels. The items listed above may not be a complete list of recommended foods or beverages. Contact your dietitian for more options. WHAT FOODS ARE NOT  RECOMMENDED? Grains White bread. White pasta. White rice. Refined cornbread. Bagels and croissants. Crackers that contain trans fat. Vegetables Creamed or fried vegetables. Vegetables in a cheese sauce. Regular canned vegetables. Regular canned tomato sauce and paste. Regular tomato and vegetable juices. Fruits Dried fruits. Canned fruit in light or heavy syrup. Fruit juice. Meat and Other Protein Products Fatty cuts of meat. Ribs, chicken wings, bacon, sausage, bologna, salami, chitterlings, fatback, hot dogs, bratwurst, and packaged luncheon meats. Salted nuts and seeds. Canned beans with salt. Dairy Whole or 2% milk, cream, half-and-half, and cream cheese. Whole-fat or sweetened yogurt. Full-fat cheeses or blue cheese. Nondairy creamers and whipped toppings. Processed cheese, cheese spreads, or cheese curds. Condiments Onion and garlic salt, seasoned salt, table salt, and sea salt. Canned and packaged gravies. Worcestershire sauce. Tartar sauce. Barbecue sauce. Teriyaki sauce. Soy sauce, including reduced sodium. Steak sauce. Fish sauce. Oyster sauce. Cocktail sauce. Horseradish. Ketchup and mustard. Meat flavorings and tenderizers. Bouillon cubes. Hot sauce. Tabasco sauce. Marinades. Taco seasonings. Relishes. Fats and Oils Butter, stick margarine, lard, shortening, ghee, and bacon fat. Coconut, palm kernel, or palm oils. Regular salad dressings. Other Pickles and olives. Salted popcorn and pretzels. The items listed above may not be a complete list of foods and beverages to avoid. Contact your dietitian for more information. WHERE CAN I  FIND MORE INFORMATION? National Heart, Lung, and Blood Institute: [employer] Document Released: 07/27/2011 Document Revised: 12/22/2013 Document Reviewed: 06/11/2013 [facility] Patient Information 2015 [place], [place]. This information is not intended to replace advice given to you by your health care provider. Make  sure you discuss any questions you have with your health care provider.

## 2015-05-17 NOTE — Progress Notes (Signed)
BP 166/90 mmHg  Pulse 81  Temp(Src) 97.3 F (36.3 C)  Wt 202 lb (91.627 kg)  SpO2 98%   Subjective:    Patient ID: Jenna Meyers, female    DOB: November 15, 1955, 59 y.o.   MRN: [id number]  HPI: Jenna Meyers is a 59 y.o. female  Chief Complaint  Patient presents with  . Follow-up    was seen in the ED for an allergic reaction on Friday  . Pain    Her pain doctor has left and she wants to know if you'd be willing to take over prescribing her pain meds.   She woke up Friday and her elbows were burning; she has splotchy red rash on her arms; kept progressing, both elbows, very strange; she called the nurse line and it was all the way down her hand and her son was sitting and watching her; she felt like it had gotten to her face and ears; she called our office and was on hold for a long time, felt it spreading everywhere; went to the walk-in clinic, and she went to the ER at that point; she could not think of anything to cause this; no new medicines, no new detergents, etc.; thought about everything; the heat went away and then the itching started; they gave her benadryl and decadron and pepcid; they did not figure out what it was; no trouble breathing, lips did not swell; she would have called EMTs; she is taking prednisone taper and pepcid and PRN benadryl  BP was 128/78 at [facility]; she says her BP is up today because of stress; she is not working with a counselor right now; she says that her pain management doctor was also a psychiatrist; she has been with her for 12 years but she left the practice to go to sports medicine; she had her and she knew her and helped her with things; her son has mental health issues; she is not seeing a therapist; I asked what kind of therapy the psychiatrist did, and she said no, that the PA did not do any real therapy; she was on psychiatric meds, but the stress was getting better; the cymbalta for the fibromyalgia was helping with the depression; she was having  awful spasms and the PA started her on Valium for that; the Valium helps her when she gets stressed out; she gave her "large amount" but she cut it down, 5 mg and she took 1-2 at a time; she gave her a list of different pain clinics to call and she did not go anywhere; she has not had time; she did not realize that I didn't prescribe Nucynta and that was totally on her own; the PA did not leave patient high and dry; she says this is just my procrastination  Relevant past medical, surgical, family and social history reviewed and updated as indicated. Interim medical history since our last visit reviewed. Allergies and medications reviewed and updated.  Review of Systems  Respiratory: Negative for wheezing.   Cardiovascular: Negative for chest pain.  Skin: Negative for rash.   Per HPI unless specifically indicated above     Objective:    BP 166/90 mmHg  Pulse 81  Temp(Src) 97.3 F (36.3 C)  Wt 202 lb (91.627 kg)  SpO2 98%  Wt Readings from Last 3 Encounters:  05/17/15 202 lb (91.627 kg)  05/14/15 200 lb (90.719 kg)  03/03/15 205 lb (92.987 kg)    Physical Exam  Constitutional: She appears well-developed and well-nourished. No  distress.  obese  HENT:  No lip swelling and no tongue swelling  Cardiovascular: Normal rate and regular rhythm.   Pulmonary/Chest: Effort normal and breath sounds normal. No accessory muscle usage. No respiratory distress. She has no decreased breath sounds. She has no wheezes.  Neurological: She displays no tremor. Gait normal.  Skin: No rash noted.  Forearms have no erythema and no swelling  Psychiatric: Her speech is normal and behavior is normal. Judgment and thought content normal. Her mood appears anxious. Her affect is not angry, not blunt, not labile and not inappropriate. Cognition and memory are normal.      Assessment & Plan:   Problem List Items Addressed This Visit      Cardiovascular and Mediastinum   Essential hypertension, benign     Patient really believes her BP is up today because of stress; she does not want BP medicine; DASH guidelines; monitor BP away from here and contact me if over 140/90; discussed risk of stroke with elevated pressures, does not matter the cause of the BP elevation (stress, etc.)        Musculoskeletal and Integument   Fibromyalgia    Taking cymbalta for that; continue current treatment; I do encourage gradually increasing activity program        Other   Lumbago    Chronic, has been treated by pain clinic specialist, Judithann Sauger, PA at [facility]; patient requests to see someone closer here in [place] after [place] left that practice      Relevant Medications   NUCYNTA 50 MG TABS tablet   Obesity    See AVS; work on weight loss of 1 pound per week, 15 pounds or so before Jan visit      Acute allergic reaction - Primary    Continue taking the prednisone and pepcid; use anti-histamine PRN; patient to watch for recurrence, pay attention to potential triggers; notify us of any repeat episodes; she knows to call 911 if any lip swelling, trouble breathing, etc.      Situational stress    I would like for patient to see a counselor/therapist; list of names provided and she will call for an appointment; I do NOT like using Valium or other benzos for stress relief and discouraged her from that; she does not have any anyway; encouraged her to call her case worker and see if other programs she might qualify for with her current situation; Relaxation Response discussed, see AVS         Follow up plan: No Follow-up on file. Keep Jan appointment  An after-visit summary was printed and given to the patient at check-out.  Please see the patient instructions which may contain other information and recommendations beyond what is mentioned above in the assessment and plan.  Face-to-face time with patient was more than 25 minutes, >50% time spent counseling and coordination of  care Orders Placed This Encounter  Procedures  . Ambulatory referral to Pain Clinic  . Ambulatory referral to Psychiatry

## 2015-05-17 NOTE — Assessment & Plan Note (Signed)
I would like for patient to see a counselor/therapist; list of names provided and she will call for an appointment; I do NOT like using Valium or other benzos for stress relief and discouraged her from that; she does not have any anyway; encouraged her to call her case worker and see if other programs she might qualify for with her current situation; Relaxation Response discussed, see AVS

## 2015-05-17 NOTE — Assessment & Plan Note (Signed)
Continue taking the prednisone and pepcid; use anti-histamine PRN; patient to watch for recurrence, pay attention to potential triggers; notify us of any repeat episodes; she knows to call 911 if any lip swelling, trouble breathing, etc.

## 2015-05-17 NOTE — Assessment & Plan Note (Addendum)
Patient really believes her BP is up today because of stress; she does not want BP medicine; DASH guidelines; monitor BP away from here and contact me if over 140/90; discussed risk of stroke with elevated pressures, does not matter the cause of the BP elevation (stress, etc.)

## 2015-05-17 NOTE — Assessment & Plan Note (Signed)
Chronic, has been treated by pain clinic specialist, Judithann Sauger, PA at [facility]; patient requests to see someone closer here in [place] after [place] left that practice

## 2015-05-24 ENCOUNTER — Telehealth: Payer: Self-pay | Admitting: Family Medicine

## 2015-05-24 NOTE — Telephone Encounter (Signed)
Pt called stated her file is still in review at the pain clinic and they also will not be able to get her pain meds prescribed on her first appointment once scheduled at the pain clinic. Pt stated she is almost out of her pain medication as well as her break through pain medication. Pt needs refill on Nucynta. Please call and advise pt as to next steps. Pt's blood pressure is great. Thanks.

## 2015-05-24 NOTE — Telephone Encounter (Signed)
I am so sorry, but I do not prescribe Nucynta; I would suggest she contact the last pain clinic to see if they can work something out

## 2015-05-24 NOTE — Telephone Encounter (Signed)
Forward to provider

## 2015-05-26 NOTE — Telephone Encounter (Signed)
Patient called back and stated that she had already gotten the message from our office.

## 2015-05-26 NOTE — Telephone Encounter (Signed)
Left message to call.

## 2015-06-09 ENCOUNTER — Other Ambulatory Visit: Payer: Self-pay | Admitting: Family Medicine

## 2015-06-09 NOTE — Telephone Encounter (Signed)
Routing to provider  

## 2015-06-11 MED ORDER — ATORVASTATIN CALCIUM 40 MG PO TABS: 40.0000 mg | ORAL_TABLET | Freq: Every day | ORAL | Status: DC

## 2015-06-11 NOTE — Telephone Encounter (Signed)
Please contact pharmacy and get a fill history of this; I don't think she's taking this correctly Thanks.

## 2015-06-11 NOTE — Telephone Encounter (Signed)
If she just filled it on October 10th, she should not be out I found her labs done in July It appears based on fill hx that she is only taking half of a pill daily if 6 months Rx lasted for 12 months I'll send in Rx for 40 mg strength to take daily

## 2015-06-11 NOTE — Telephone Encounter (Signed)
Patient filled rx on June 8th and Oct. 10th.

## 2015-06-24 ENCOUNTER — Emergency Department
Admission: EM | Admit: 2015-06-24 | Discharge: 2015-06-24 | Disposition: A | Discharge status: Home / Self Care | Payer: Medicare Other | Attending: Emergency Medicine | Admitting: Emergency Medicine

## 2015-06-24 ENCOUNTER — Emergency Department: Payer: Medicare Other

## 2015-06-24 ENCOUNTER — Encounter: Payer: Self-pay | Admitting: Emergency Medicine

## 2015-06-24 DIAGNOSIS — R519 Headache, unspecified: Secondary | ICD-10-CM

## 2015-06-24 DIAGNOSIS — Z79899 Other long term (current) drug therapy: Secondary | ICD-10-CM | POA: Diagnosis not present

## 2015-06-24 DIAGNOSIS — Z79891 Long term (current) use of opiate analgesic: Secondary | ICD-10-CM | POA: Diagnosis not present

## 2015-06-24 DIAGNOSIS — Z720 Tobacco use: Secondary | ICD-10-CM | POA: Insufficient documentation

## 2015-06-24 DIAGNOSIS — R51 Headache: Secondary | ICD-10-CM | POA: Insufficient documentation

## 2015-06-24 DIAGNOSIS — I10 Essential (primary) hypertension: Secondary | ICD-10-CM | POA: Diagnosis not present

## 2015-06-24 LAB — COMPREHENSIVE METABOLIC PANEL
ALK PHOS: 73 U/L (ref 38–126)
ALT: 18 U/L (ref 14–54)
ANION GAP: 3 — AB (ref 5–15)
AST: 18 U/L (ref 15–41)
Albumin: 4.2 g/dL (ref 3.5–5.0)
BILIRUBIN TOTAL: 0.3 mg/dL (ref 0.3–1.2)
BUN: 14 mg/dL (ref 6–20)
CALCIUM: 9.1 mg/dL (ref 8.9–10.3)
CO2: 26 mmol/L (ref 22–32)
Chloride: 111 mmol/L (ref 101–111)
Creatinine, Ser: 0.92 mg/dL (ref 0.44–1.00)
GFR calc Af Amer: >60 mL/min (ref >60)
Glucose, Bld: 86 mg/dL (ref 65–99)
POTASSIUM: 3.9 mmol/L (ref 3.5–5.1)
Sodium: 140 mmol/L (ref 135–145)
TOTAL PROTEIN: 7.1 g/dL (ref 6.5–8.1)

## 2015-06-24 LAB — CBC
HEMATOCRIT: 41.9 % (ref 35.0–47.0)
HEMOGLOBIN: 14.4 g/dL (ref 12.0–16.0)
MCH: 31.6 pg (ref 26.0–34.0)
MCHC: 34.3 g/dL (ref 32.0–36.0)
MCV: 92.1 fL (ref 80.0–100.0)
Platelets: 267 10*3/uL (ref 150–440)
RBC: 4.55 MIL/uL (ref 3.80–5.20)
RDW: 13 % (ref 11.5–14.5)
WBC: 9.8 10*3/uL (ref 3.6–11.0)

## 2015-06-24 MED ORDER — BUTALBITAL-APAP-CAFFEINE 50-325-40 MG PO TABS
2.0000 | ORAL_TABLET | Freq: Once | ORAL | Status: AC
  Administered 2015-06-24: 2 via ORAL

## 2015-06-24 MED ORDER — BUTALBITAL-APAP-CAFFEINE 50-325-40 MG PO TABS: 1.0000 | ORAL_TABLET | Freq: Four times a day (QID) | ORAL | Status: DC | PRN

## 2015-06-24 MED ORDER — BUTALBITAL-APAP-CAFFEINE 50-325-40 MG PO TABS
ORAL_TABLET | ORAL | Status: AC
  Administered 2015-06-24: 2 via ORAL
  Filled 2015-06-24: qty 2

## 2015-06-24 NOTE — ED Provider Notes (Signed)
[facility] Emergency Department Provider Note  Time seen: 10:38 PM  I have reviewed the triage vital signs and the nursing notes.   HISTORY  Chief Complaint Headache    HPI Jenna PennerKatherine Meyers is a 59 y.o. female with a past medical history of hyperlipidemia, arthritis, fibromyalgia, anxiety, depression, headaches, who presents to the emergency department with a left-sided headache. According to the patient for the past 3 weeks she has had an intermittent headache on the left side. States she also felt somewhat dizzy today so she came to the emergency department for evaluation. Denies any weakness or numbness, confusion or slurred speech. States a history of migraines, but this is an intermittent headache which would be very atypical for her migraines. Denies nausea or vomiting. Describes the headache as a pressure feeling behind the left forehead. Denies any visual disturbances or changes. Currently states the headache is a 2 or 3/10.     Past Medical History  Diagnosis Date  . Hyperlipidemia   . Arthritis     back  . Lumbago   . Fibromyalgia   . Vitamin D deficiency disease   . Depression   . Anxiety   . History of tobacco abuse     Patient Active Problem List   Diagnosis Date Noted  . Essential hypertension, benign 05/17/2015  . Acute allergic reaction 05/17/2015  . Situational stress 05/17/2015  . Obesity 03/03/2015  . Abnormal thyroid blood test 03/03/2015  . Tobacco abuse 03/03/2015  . Hyperlipidemia   . Lumbago   . Fibromyalgia   . Vitamin D deficiency disease     Past Surgical History  Procedure Laterality Date  . Tonsillectomy and adenoidectomy  1962  . Cesarean section  1996  . Laparoscopic abdominal exploration  1999  . Abdominal hysterectomy  2000    partial, still has cervix    Current Outpatient Rx  Name  Route  Sig  Dispense  Refill  . atorvastatin (LIPITOR) 40 MG tablet   Oral   Take 1 tablet (40 mg total) by mouth at  bedtime.   30 tablet   2     Pharmacist -- she's apparently taking 40 mg daily  ...   . cholecalciferol (VITAMIN D) 1000 UNITS tablet   Oral   Take 1,000 Units by mouth daily.         . diazepam (VALIUM) 5 MG tablet   Oral   Take 5 mg by mouth.      2   . diphenhydrAMINE (BENADRYL) 25 mg capsule   Oral   Take 1 capsule (25 mg total) by mouth every 4 (four) hours as needed.   30 capsule   0   . DULoxetine (CYMBALTA) 60 MG capsule   Oral   Take 120 mg by mouth daily.         . famotidine (PEPCID) 20 MG tablet   Oral   Take 1 tablet (20 mg total) by mouth daily.   7 tablet   0   . NUCYNTA 50 MG TABS tablet   Oral   Take 50 mg by mouth.      0     Dispense as written.   . predniSONE (STERAPRED UNI-PAK 21 TAB) 10 MG (21) TBPK tablet      Starting tomorrow, 05/15/15. Take 6 tablets on day 1 Take 5 tablets on day 2 Take 4 tablets on day 3 Take 3 tablets on day 4 Take 2 tablets on day 5 Take 1 tablet  on day 6   21 tablet   0   . Tapentadol HCl (NUCYNTA ER) 100 MG TB12   Oral   Take 100 mg by mouth 2 (two) times daily.         . traZODone (DESYREL) 50 MG tablet   Oral   Take 50 mg by mouth at bedtime as needed.           Allergies Review of patient's allergies indicates no known allergies.  Family History  Problem Relation Age of Onset  . Cancer Mother     colon  . Osteoporosis Mother   . Hyperlipidemia Mother   . Alzheimer's disease Father   . Thyroid disease Son   . Heart disease Brother     Social History Social History  Substance Use Topics  . Smoking status: Current Every Day Smoker -- 1.00 packs/day for 15 years    Types: Cigarettes  . Smokeless tobacco: Never Used  . Alcohol Use: No    Review of Systems Constitutional: Negative for fever. Eyes: Negative for visual changes. Cardiovascular: Negative for chest pain. Respiratory: Negative for shortness of breath. Gastrointestinal: Negative for abdominal pain Neurological:  Mild to moderate intermittent left-sided headache. Denies focal weakness or numbness. 10-point ROS otherwise negative.  ____________________________________________   PHYSICAL EXAM:  VITAL SIGNS: ED Triage Vitals  Enc Vitals Group     BP 06/24/15 2021 142/98 mmHg     Pulse Rate 06/24/15 2021 108     Resp 06/24/15 2021 16     Temp 06/24/15 2021 98.9 F (37.2 C)     Temp Source 06/24/15 2021 Oral     SpO2 06/24/15 2021 93 %     Weight 06/24/15 2021 203 lb (92.08 kg)     Height 06/24/15 2021  (1.626 m)     Head Cir --      Peak Flow --      Pain Score 06/24/15 2022 3     Pain Loc --      Pain Edu? --      Excl. in GC? --     Constitutional: Alert and oriented. Well appearing and in no distress. Eyes: Normal exam, PERRL, EOMI, no photophobia. ENT   Head: Normocephalic and atraumatic.   Mouth/Throat: Mucous membranes are moist. Cardiovascular: Normal rate, regular rhythm. No murmur Respiratory: Normal respiratory effort without tachypnea nor retractions. Breath sounds are clear  Gastrointestinal: Soft and nontender. No distention.  Musculoskeletal: Nontender with normal range of motion in all extremities. Neurologic:  Normal speech and language. No gross focal neurologic deficits are appreciated. Speech is normal. No pronator drift. PERRL, cranial nerves intact, no photophobia. Skin:  Skin is warm, dry and intact.  Psychiatric: Mood and affect are normal. Speech and behavior are normal.   ____________________________________________     RADIOLOGY  Normal brain on CT.  ____________________________________________   INITIAL IMPRESSION / ASSESSMENT AND PLAN / ED COURSE  Pertinent labs & imaging results that were available during my care of the patient were reviewed by me and considered in my medical decision making (see chart for details).  CT shows normal results. Labs are largely within normal limits. Patient states her headache is very mild at this time.  We'll prescribe Fioricet and have the patient follow up with her primary care physician. Patient is agreeable to plan. I discussed with the patient my normal headache return precautions to which she is agreeable.   ____________________________________________   FINAL CLINICAL IMPRESSION(S) / ED DIAGNOSES  Headache  Minna Antis, MD 06/24/15 2241

## 2015-06-24 NOTE — Discharge Instructions (Signed)

## 2015-06-24 NOTE — ED Notes (Signed)
Headache x 3 weeks.  "pressure" to left forehead.  Tonight also started to feel shooting pains to right side of head radiating toward right ear.

## 2015-06-24 NOTE — ED Notes (Signed)
PO fluids and crackers with Peanut butter provided per patient request. Awaiting results from CT scan.

## 2015-07-01 ENCOUNTER — Telehealth: Payer: Self-pay | Admitting: Family Medicine

## 2015-07-01 NOTE — Telephone Encounter (Signed)
Pt has been given a referral to the Pain Clinic, her appt is not until December 1st. [facility] will not give her any more meds. Pt stated she has been on pain meds for over 17 years. Please call pt with any options you can give. Thanks.

## 2015-07-02 NOTE — Telephone Encounter (Signed)
Routing to provider  

## 2015-07-06 NOTE — Telephone Encounter (Signed)
Plain tylenol (acetaminophen) up to a maximum of 3,000 mg per day Turmeric capsules per package instructions, a natural anti-inflammatory OTC anti-inflammatories are also often used, as long as she's not taking Rx anti-inflammatories; my first recommendation is naproxen 220 mg and she can take one or two pills twice a day; that's max; take with food Counseling recommended too as a means of bio-feedback, self-regulation of pain symptoms

## 2015-07-07 NOTE — Telephone Encounter (Signed)
Patient notified

## 2015-07-07 NOTE — Telephone Encounter (Signed)
Left message to call.

## 2015-07-22 ENCOUNTER — Ambulatory Visit (HOSPITAL_BASED_OUTPATIENT_CLINIC_OR_DEPARTMENT_OTHER): Payer: Medicare Other | Admitting: Anesthesiology

## 2015-07-22 ENCOUNTER — Encounter: Payer: Self-pay | Admitting: Anesthesiology

## 2015-07-22 VITALS — BP 141/85 | HR 81 | Temp 97.6°F | Resp 16 | Ht 65.0 in | Wt 203.0 lb

## 2015-07-22 DIAGNOSIS — M5136 Other intervertebral disc degeneration, lumbar region: Secondary | ICD-10-CM

## 2015-07-22 DIAGNOSIS — M545 Low back pain: Secondary | ICD-10-CM

## 2015-07-22 DIAGNOSIS — M47819 Spondylosis without myelopathy or radiculopathy, site unspecified: Secondary | ICD-10-CM

## 2015-07-22 NOTE — Progress Notes (Signed)
Safety precautions to be maintained throughout the outpatient stay will include: orient to surroundings, keep bed in low position, maintain call bell within reach at all times, provide assistance with transfer out of bed and ambulation.   New patient evaluation

## 2015-07-23 ENCOUNTER — Ambulatory Visit: Payer: Medicare Other | Admitting: Anesthesiology

## 2015-07-23 NOTE — Progress Notes (Signed)
CC:  Midthoracic  and low back pain  Procedure:  None  HPI:  Jenna Meyers is a 59 year old white female with long-standing history of low back pain. She is referred for evaluation of her thoracic and low back pain. This pain has been present for over 17 years and her primary pain complaint is lumbar pain with radiation into the bilateral buttocks and posterior legs. His pain has gradually intensified over the past few months the point where her maximum VAS score is a 7 and she is requiring her medications for management of this. She was seen at the [facility] pain management [facility] and followed by Lauris Poag however this practitioner recently left the practice and she has been left without a provider. She presents today for initial patient evaluation. She states that she has had no success with previous injections performed by [facility] pain clinic. She has had a thorough workup and the only thing that has helped her pain is Nucynta at 100 mg twice a day with Nucynta 50 mg tablets up to 400 mg per day for breakthrough pain. This is to treat her low back pain and fibromyalgia. She furthermore states that she takes Valium 5 mg up to 20 mg in a day in addition to Cymbalta. She reports that she's had an MRI in the past which shows no evidence of anything surgically correctable but this is unavailable to me at this time and furthermore she states that it showed no evidence of any significant degenerative disease.  BP 141/85 mmHg  Pulse 81  Temp(Src) 97.6 F (36.4 C) (Oral)  Resp 16  Ht  (1.651 m)  Wt 203 lb (92.08 kg)  BMI 33.78 kg/m2  SpO2 99%   Current outpatient prescriptions:  .  atorvastatin (LIPITOR) 40 MG tablet, Take 1 tablet (40 mg total) by mouth at bedtime., Disp: 30 tablet, Rfl: 2 .  cholecalciferol (VITAMIN D) 1000 UNITS tablet, Take 1,000 Units by mouth daily., Disp: , Rfl:  .  diazepam (VALIUM) 5 MG tablet, Take 5 mg by mouth., Disp: , Rfl: 2 .  DULoxetine (CYMBALTA) 60 MG  capsule, Take 120 mg by mouth daily., Disp: , Rfl:  .  NUCYNTA 50 MG TABS tablet, Take 50 mg by mouth., Disp: , Rfl: 0 .  Tapentadol HCl (NUCYNTA ER) 100 MG TB12, Take 100 mg by mouth 2 (two) times daily., Disp: , Rfl:  .  butalbital-acetaminophen-caffeine (FIORICET) 50-325-40 MG tablet, Take 1-2 tablets by mouth every 6 (six) hours as needed for headache. (Patient not taking: Reported on 07/22/2015), Disp: 20 tablet, Rfl: 0 .  diphenhydrAMINE (BENADRYL) 25 mg capsule, Take 1 capsule (25 mg total) by mouth every 4 (four) hours as needed. (Patient not taking: Reported on 07/22/2015), Disp: 30 capsule, Rfl: 0 .  famotidine (PEPCID) 20 MG tablet, Take 1 tablet (20 mg total) by mouth daily. (Patient not taking: Reported on 07/22/2015), Disp: 7 tablet, Rfl: 0 .  predniSONE (STERAPRED UNI-PAK 21 TAB) 10 MG (21) TBPK tablet, Starting tomorrow, 05/15/15. Take 6 tablets on day 1 Take 5 tablets on day 2 Take 4 tablets on day 3 Take 3 tablets on day 4 Take 2 tablets on day 5 Take 1 tablet on day 6 (Patient not taking: Reported on 07/22/2015), Disp: 21 tablet, Rfl: 0 .  traZODone (DESYREL) 50 MG tablet, Take 50 mg by mouth at bedtime as needed., Disp: , Rfl:   Patient Active Problem List   Diagnosis Date Noted  . Essential hypertension, benign 05/17/2015  .  Acute allergic reaction 05/17/2015  . Situational stress 05/17/2015  . Obesity 03/03/2015  . Abnormal thyroid blood test 03/03/2015  . Tobacco abuse 03/03/2015  . Hyperlipidemia   . Lumbago   . Fibromyalgia   . Vitamin D deficiency disease     No Known Allergies  Physical exam:    PERRL, EOMI  Alert and Ox3  H:  RRR   Lungs:  CTA  Evaluation of musculoskeletal exam was deferred  Assessment:  1.  Chronic low back pain with degenerative disc disease and facet arthropathy  2. Chronic opioid management complex  3. Multiple psychosocial issues with anxiety, fibromyalgia, and myofascial low back pain  Plan:    1.  I had a long discussion  with the patient and feel that she would not be a candidate for our services here at the pain control Center. Furthermore I do not feel that I would feel comfortable prescribing the medications she desires based on her current condition. I have talked to her about a more conservative dosing however she feels that that would be inadequate to treat her pain.  2. I have offered her some opportunities for injection therapy to see if we could ameliorate some of the low back pain and posterior leg pain that she is experiencing however she refuses this. Based on these discussions I have volunteered to see if any other practitioners within the clinic would be willing to accept her as a patient.    Dr. Gwenyth BenderJames Omari Mcmanaway MD 5:45 PM

## 2015-08-04 ENCOUNTER — Telehealth: Payer: Self-pay | Admitting: Family Medicine

## 2015-08-04 NOTE — Telephone Encounter (Signed)
Routing to provider for advice.

## 2015-08-04 NOTE — Telephone Encounter (Signed)
Pt came off of chronic pain medicine and is now on the 3rd day of her feet tingling. She says that she came off of her meds about 2 weeks ago.

## 2015-08-05 ENCOUNTER — Telehealth: Payer: Self-pay | Admitting: Family Medicine

## 2015-08-05 NOTE — Telephone Encounter (Signed)
She is welcome to come in for an appointment if she chooses but I will not be prescribing pain medicine

## 2015-08-05 NOTE — Telephone Encounter (Signed)
Pt.notified

## 2015-08-05 NOTE — Telephone Encounter (Signed)
Pt would like to let you know that she is not seeking medication she only wanted to let you know that she was no longer taking them and now that they are out of her system she now has tingling in her feet. She wanted to know if it may be something that was always present but covered up by medication. She has an appt on the 13th of January and wanted to know if you felt it would be ok for her to wait or if you suggested she come in sooner. She wanted me to be sure that i told you that she WAS NOT by any means asking for pain meds she just wants advice.  Thanks

## 2015-08-05 NOTE — Telephone Encounter (Signed)
Thank you for clarifying Please invite her in, because it might be medicine withdrawal, but it could also be that something has been masked (vitamin B12 deficiency, thyroid disorder, high blood sugar, etc.) I think it would be okay to wait a month, but do move appt up if getting worse so we can evaluate and do bloodwork Thank you

## 2015-09-03 ENCOUNTER — Ambulatory Visit (INDEPENDENT_AMBULATORY_CARE_PROVIDER_SITE_OTHER): Payer: Medicare Other | Admitting: Family Medicine

## 2015-09-03 ENCOUNTER — Encounter: Payer: Self-pay | Admitting: Family Medicine

## 2015-09-03 VITALS — BP 121/87 | HR 93 | Temp 98.9°F | Wt 195.0 lb

## 2015-09-03 DIAGNOSIS — J069 Acute upper respiratory infection, unspecified: Secondary | ICD-10-CM | POA: Diagnosis not present

## 2015-09-03 DIAGNOSIS — E559 Vitamin D deficiency, unspecified: Secondary | ICD-10-CM

## 2015-09-03 DIAGNOSIS — E785 Hyperlipidemia, unspecified: Secondary | ICD-10-CM

## 2015-09-03 DIAGNOSIS — M545 Low back pain, unspecified: Secondary | ICD-10-CM

## 2015-09-03 DIAGNOSIS — Z5181 Encounter for therapeutic drug level monitoring: Secondary | ICD-10-CM | POA: Diagnosis not present

## 2015-09-03 DIAGNOSIS — Z1159 Encounter for screening for other viral diseases: Secondary | ICD-10-CM

## 2015-09-03 DIAGNOSIS — R946 Abnormal results of thyroid function studies: Secondary | ICD-10-CM | POA: Diagnosis not present

## 2015-09-03 DIAGNOSIS — M797 Fibromyalgia: Secondary | ICD-10-CM | POA: Diagnosis not present

## 2015-09-03 DIAGNOSIS — E669 Obesity, unspecified: Secondary | ICD-10-CM | POA: Diagnosis not present

## 2015-09-03 DIAGNOSIS — G8929 Other chronic pain: Secondary | ICD-10-CM

## 2015-09-03 DIAGNOSIS — R7989 Other specified abnormal findings of blood chemistry: Secondary | ICD-10-CM

## 2015-09-03 DIAGNOSIS — I10 Essential (primary) hypertension: Secondary | ICD-10-CM

## 2015-09-03 DIAGNOSIS — Z23 Encounter for immunization: Secondary | ICD-10-CM | POA: Diagnosis not present

## 2015-09-03 MED ORDER — ALBUTEROL SULFATE HFA 108 (90 BASE) MCG/ACT IN AERS: 2.0000 | INHALATION_SPRAY | RESPIRATORY_TRACT | Status: DC | PRN

## 2015-09-03 MED ORDER — DULOXETINE HCL 60 MG PO CPEP: 120.0000 mg | ORAL_CAPSULE | Freq: Every day | ORAL | Status: DC

## 2015-09-03 NOTE — Assessment & Plan Note (Signed)
Recheck TSH today.  

## 2015-09-03 NOTE — Assessment & Plan Note (Signed)
Controlled today; see AVS; try DASH guidelines; continue weight loss

## 2015-09-03 NOTE — Assessment & Plan Note (Signed)
Praise given for amazing 8 pounds of weight loss with better eating
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
## 2015-09-03 NOTE — Patient Instructions (Signed)
Try vitamin C (orange juice if not diabetic or vitamin C tablets) and drink green tea to help your immune system during your illness Get plenty of rest and hydration Your goal blood pressure is less than 140 mmHg on top. Try to follow the DASH guidelines (DASH stands for Dietary Approaches to Stop Hypertension) Try to limit the sodium in your diet.  Ideally, consume less than 1.5 grams (less than 1,500mg ) per day. Do not add salt when cooking or at the table.  Check the sodium amount on labels when shopping, and choose items lower in sodium when given a choice. Avoid or limit foods that already contain a lot of sodium. Eat a diet rich in fruits and vegetables and whole grains. Return in 3 months for f/u

## 2015-09-03 NOTE — Assessment & Plan Note (Signed)
Check vit D today and supplement if needed 

## 2015-09-03 NOTE — Assessment & Plan Note (Signed)
Check lipids and sgpt today; decrease sat fats

## 2015-09-04 LAB — COMPREHENSIVE METABOLIC PANEL
A/G RATIO: 1.8 (ref 1.1–2.5)
ALBUMIN: 4.4 g/dL (ref 3.5–5.5)
ALT: 18 IU/L (ref 0–32)
AST: 15 IU/L (ref 0–40)
Alkaline Phosphatase: 87 IU/L (ref 39–117)
BILIRUBIN TOTAL: 0.2 mg/dL (ref 0.0–1.2)
BUN / CREAT RATIO: 13 (ref 9–23)
BUN: 11 mg/dL (ref 6–24)
CALCIUM: 9.2 mg/dL (ref 8.7–10.2)
CHLORIDE: 106 mmol/L (ref 96–106)
CO2: 20 mmol/L (ref 18–29)
Creatinine, Ser: 0.87 mg/dL (ref 0.57–1.00)
GFR, EST AFRICAN AMERICAN: 85 mL/min/{1.73_m2} (ref >59)
GFR, EST NON AFRICAN AMERICAN: 74 mL/min/{1.73_m2} (ref >59)
GLUCOSE: 90 mg/dL (ref 65–99)
Globulin, Total: 2.4 g/dL (ref 1.5–4.5)
Potassium: 4.4 mmol/L (ref 3.5–5.2)
Sodium: 144 mmol/L (ref 134–144)
TOTAL PROTEIN: 6.8 g/dL (ref 6.0–8.5)

## 2015-09-04 LAB — LIPID PANEL W/O CHOL/HDL RATIO
Cholesterol, Total: 234 mg/dL — ABNORMAL HIGH (ref 100–199)
HDL: 46 mg/dL (ref >39)
LDL Calculated: 164 mg/dL — ABNORMAL HIGH (ref 0–99)
TRIGLYCERIDES: 122 mg/dL (ref 0–149)
VLDL Cholesterol Cal: 24 mg/dL (ref 5–40)

## 2015-09-04 LAB — TSH: TSH: 2.22 u[IU]/mL (ref 0.450–4.500)

## 2015-09-04 LAB — VITAMIN D 25 HYDROXY (VIT D DEFICIENCY, FRACTURES): VIT D 25 HYDROXY: 27 ng/mL — AB (ref 30.0–100.0)

## 2015-09-04 LAB — HEPATITIS C ANTIBODY

## 2015-09-07 ENCOUNTER — Telehealth: Payer: Self-pay | Admitting: Family Medicine

## 2015-09-07 NOTE — Telephone Encounter (Signed)
I tried to call to discuss labs; reached recorded msg that said at the subscriber's request, this phone does not receive incoming calls I could not leave msg Will send letter

## 2015-09-09 ENCOUNTER — Telehealth: Payer: Self-pay | Admitting: Family Medicine

## 2015-09-09 NOTE — Telephone Encounter (Signed)
Pt called would like to know lab results from her last results. Please call her at your earliest convenience. Thanks.

## 2015-09-10 MED ORDER — ATORVASTATIN CALCIUM 40 MG PO TABS: 40.0000 mg | ORAL_TABLET | Freq: Every day | ORAL | Status: DC

## 2015-09-10 NOTE — Telephone Encounter (Signed)
Routing to provider  

## 2015-09-10 NOTE — Telephone Encounter (Signed)
Pt would like call back on [phone]

## 2015-09-10 NOTE — Telephone Encounter (Signed)
I spoke with patient Sputum and nasal drainage are green; turned green I explained no antibiotics indicated just based on color change She is starting to feel a little bit better today than yesterday Offered prednisone, she declined Discussed lab results She'll start back on vit D 1000 to 2000 iu daily She had been out of cholesterol medicine for about ten days before having labs drawn; will get her back on atorvastatin, Rx sent to [facility] while we were talking, but she wanted [facility] to go [facility] called [facility] and told them it should have gone to [facility]; he'll delete it out there; new Rx sent to [facility]

## 2015-09-20 NOTE — Assessment & Plan Note (Signed)
Under care of pain management; I am not going to prescribe narcotics for this patient

## 2015-09-20 NOTE — Assessment & Plan Note (Signed)
Under care of pain management; I am not going to prescribe narcotics for this patient 

## 2015-12-26 ENCOUNTER — Encounter: Payer: Self-pay | Admitting: *Deleted

## 2015-12-26 ENCOUNTER — Emergency Department
Admission: EM | Admit: 2015-12-26 | Discharge: 2015-12-26 | Disposition: A | Discharge status: Home / Self Care | Payer: Medicare Other | Attending: Emergency Medicine | Admitting: Emergency Medicine

## 2015-12-26 ENCOUNTER — Emergency Department: Payer: Medicare Other

## 2015-12-26 DIAGNOSIS — J40 Bronchitis, not specified as acute or chronic: Secondary | ICD-10-CM | POA: Insufficient documentation

## 2015-12-26 DIAGNOSIS — Z79899 Other long term (current) drug therapy: Secondary | ICD-10-CM | POA: Diagnosis not present

## 2015-12-26 DIAGNOSIS — F17210 Nicotine dependence, cigarettes, uncomplicated: Secondary | ICD-10-CM | POA: Insufficient documentation

## 2015-12-26 DIAGNOSIS — E785 Hyperlipidemia, unspecified: Secondary | ICD-10-CM | POA: Insufficient documentation

## 2015-12-26 DIAGNOSIS — I10 Essential (primary) hypertension: Secondary | ICD-10-CM | POA: Diagnosis not present

## 2015-12-26 DIAGNOSIS — F329 Major depressive disorder, single episode, unspecified: Secondary | ICD-10-CM | POA: Diagnosis not present

## 2015-12-26 DIAGNOSIS — Z791 Long term (current) use of non-steroidal anti-inflammatories (NSAID): Secondary | ICD-10-CM | POA: Insufficient documentation

## 2015-12-26 DIAGNOSIS — R0602 Shortness of breath: Secondary | ICD-10-CM | POA: Diagnosis present

## 2015-12-26 DIAGNOSIS — E669 Obesity, unspecified: Secondary | ICD-10-CM | POA: Diagnosis not present

## 2015-12-26 HISTORY — DX: Emphysema, unspecified: J43.9

## 2015-12-26 MED ORDER — HYDROCODONE-ACETAMINOPHEN 5-325 MG PO TABS: 1.0000 | ORAL_TABLET | Freq: Four times a day (QID) | ORAL | Status: DC | PRN

## 2015-12-26 MED ORDER — PREDNISONE 20 MG PO TABS
60.0000 mg | ORAL_TABLET | Freq: Once | ORAL | Status: AC
  Administered 2015-12-26: 60 mg via ORAL
  Filled 2015-12-26: qty 3

## 2015-12-26 MED ORDER — PREDNISONE 20 MG PO TABS: 40.0000 mg | ORAL_TABLET | Freq: Every day | ORAL | Status: AC

## 2015-12-26 MED ORDER — IPRATROPIUM-ALBUTEROL 0.5-2.5 (3) MG/3ML IN SOLN
3.0000 mL | Freq: Once | RESPIRATORY_TRACT | Status: AC
  Administered 2015-12-26: 3 mL via RESPIRATORY_TRACT
  Filled 2015-12-26: qty 3

## 2015-12-26 MED ORDER — HYDROCODONE-ACETAMINOPHEN 5-325 MG PO TABS
1.0000 | ORAL_TABLET | Freq: Once | ORAL | Status: AC
  Administered 2015-12-26: 1 via ORAL
  Filled 2015-12-26: qty 1

## 2015-12-26 MED ORDER — ALBUTEROL SULFATE (2.5 MG/3ML) 0.083% IN NEBU
5.0000 mg | INHALATION_SOLUTION | Freq: Once | RESPIRATORY_TRACT | Status: AC
  Administered 2015-12-26: 5 mg via RESPIRATORY_TRACT

## 2015-12-26 MED ORDER — ALBUTEROL SULFATE (2.5 MG/3ML) 0.083% IN NEBU
INHALATION_SOLUTION | RESPIRATORY_TRACT | Status: AC
  Administered 2015-12-26: 5 mg via RESPIRATORY_TRACT
  Filled 2015-12-26: qty 3

## 2015-12-26 NOTE — ED Notes (Signed)
Pt reports onset of SOB and dry cough at 2000 last night. Pt reports she has been unable to catch her breath. Pt reports inhaler treatments have decreased the wheezing but have not decreased the coughing. Pt has hx of emphysema.

## 2015-12-26 NOTE — ED Provider Notes (Signed)
Time Seen: Approximately 1530 I have reviewed the triage notes  Chief Complaint: Shortness of Breath   History of Present Illness: Jenna Meyers is a 60 y.o. female who presents with a acute presentation of shortness of breath and a dry nonproductive cough. She states it was really the cough that was persistent and she had difficulty catching her breath. She denies any vomiting associated with the cough. She denies any fever or sore throat. She states that she has a history of COPD or mild emphysema and states that she gets bronchitis and/or allergies this time of year. She states that she feels improved after she was given a breathing treatment in the triage area. She states her breathing is easier but she still has a cough. Eyes any chest pain, nausea, vomiting, arm or jaw pain. Past Medical History  Diagnosis Date  . Hyperlipidemia   . Arthritis     back  . Lumbago   . Fibromyalgia   . Vitamin D deficiency disease   . Depression   . History of tobacco abuse   . Emphysema lung [facility])     Patient Active Problem List   Diagnosis Date Noted  . Need for hepatitis C screening test 09/03/2015  . Essential hypertension, benign 05/17/2015  . Acute allergic reaction 05/17/2015  . Situational stress 05/17/2015  . Obesity 03/03/2015  . Abnormal thyroid blood test 03/03/2015  . Tobacco abuse 03/03/2015  . Hyperlipidemia   . Lumbago   . Fibromyalgia   . Vitamin D deficiency disease     Past Surgical History  Procedure Laterality Date  . Tonsillectomy and adenoidectomy  1962  . Cesarean section  1996  . Laparoscopic abdominal exploration  1999  . Abdominal hysterectomy  2000    partial, still has cervix    Past Surgical History  Procedure Laterality Date  . Tonsillectomy and adenoidectomy  1962  . Cesarean section  1996  . Laparoscopic abdominal exploration  1999  . Abdominal hysterectomy  2000    partial, still has cervix    Current Outpatient Rx  Name  Route  Sig   Dispense  Refill  . acetaminophen (TYLENOL) 650 MG CR tablet   Oral   Take 650 mg by mouth every 8 (eight) hours as needed for pain.         Marland Kitchen albuterol (PROVENTIL HFA) 108 (90 Base) MCG/ACT inhaler   Inhalation   Inhale 2 puffs into the lungs every 4 (four) hours as needed for wheezing or shortness of breath.   1 Inhaler   1   . atorvastatin (LIPITOR) 40 MG tablet   Oral   Take 1 tablet (40 mg total) by mouth at bedtime.   30 tablet   5   . cholecalciferol (VITAMIN D) 1000 UNITS tablet   Oral   Take 1,000 Units by mouth daily.         . DULoxetine (CYMBALTA) 60 MG capsule   Oral   Take 2 capsules (120 mg total) by mouth daily.   60 capsule   3   . HYDROcodone-acetaminophen (NORCO) 5-325 MG tablet   Oral   Take 1 tablet by mouth every 6 (six) hours as needed (cough).   12 tablet   0   . naproxen (NAPROSYN) 250 MG tablet   Oral   Take 220 mg by mouth 2 (two) times daily with a meal.         . predniSONE (DELTASONE) 20 MG tablet   Oral  Take 2 tablets (40 mg total) by mouth daily.   10 tablet   0   . TURMERIC PO   Oral   Take by mouth 2 (two) times daily.           Allergies:  Review of patient's allergies indicates no known allergies.  Family History: Family History  Problem Relation Age of Onset  . Cancer Mother     colon  . Osteoporosis Mother   . Hyperlipidemia Mother   . Alzheimer's disease Father   . Thyroid disease Son   . Heart disease Brother     Social History: Social History  Substance Use Topics  . Smoking status: Current Every Day Smoker -- 1.00 packs/day for 15 years    Types: Cigarettes  . Smokeless tobacco: Never Used  . Alcohol Use: No     Review of Systems:   10 point review of systems was performed and was otherwise negative:  Constitutional: No fever Eyes: No visual disturbances ENT: No sore throat, ear pain Cardiac: No chest pain Respiratory: Shortness of breath with some wheezing at home. She denies any  stridor Abdomen: No abdominal pain, no vomiting, No diarrhea Endocrine: No weight loss, No night sweats Extremities: No peripheral edema, cyanosis Skin: No rashes, easy bruising Neurologic: No focal weakness, trouble with speech or swollowing Urologic: No dysuria, Hematuria, or urinary frequency   Physical Exam:  ED Triage Vitals  Enc Vitals Group     BP 12/26/15 1501 141/84 mmHg     Pulse Rate 12/26/15 1501 88     Resp 12/26/15 1501 18     Temp 12/26/15 1501 98.4 F (36.9 C)     Temp Source 12/26/15 1501 Oral     SpO2 12/26/15 1501 98 %     Weight 12/26/15 1501 200 lb (90.719 kg)     Height 12/26/15 1501 5\' 5"  (1.651 m)     Head Cir --      Peak Flow --      Pain Score --      Pain Loc --      Pain Edu? --      Excl. in GC? --     General: Awake , Alert , and Oriented times 3; GCS 15 Head: Normal cephalic , atraumatic Eyes: Pupils equal , round, reactive to light Nose/Throat: No nasal drainage, patent upper airway without erythema or exudate.  Neck: Supple, Full range of motion, No anterior adenopathy or palpable thyroid masses Lungs: Clear to ascultation without wheezes , rhonchi, or rales Heart: Regular rate, regular rhythm without murmurs , gallops , or rubs Abdomen: Soft, non tender without rebound, guarding , or rigidity; bowel sounds positive and symmetric in all 4 quadrants. No organomegaly .        Extremities: 2 plus symmetric pulses. No edema, clubbing or cyanosis Neurologic: normal ambulation, Motor symmetric without deficits, sensory intact Skin: warm, dry, no rashes   EKG:  ED ECG REPORT I, Jennye MoccasinBrian S Quigley, the attending physician, personally viewed and interpreted this ECG.  Date: 12/26/2015 EKG Time: 1614 Rate: 91 Rhythm: normal sinus rhythm QRS Axis: normal Intervals: normal ST/T Wave abnormalities: normal Conduction Disturbances: none Narrative Interpretation: unremarkable Normal   Radiology: EXAM: CHEST 2 VIEW  COMPARISON: Re-  01/2014  FINDINGS: Normal heart size, mediastinal contours, and pulmonary vascularity.  Central peribronchial thickening.  Lungs clear.  No pleural effusion or pneumothorax.  Bones appear demineralized.  IMPRESSION: Bronchitic changes without infiltrate.      I personally  reviewed the radiologic studies     ED Course: Patient's stay here showed symptomatic improvement and today clear to wheezing with the first treatment and had a normal lung exam. She states she feels symptomatically improved after oral steroids here in emergency department along with a Vicodin for her cough. Patient's been able tolerate oral fluids and does not appear to be dehydrated or any signs of respiratory distress. Patient denies any risk factors for pulmonary embolism, DVT etc. She does not appear to have any focal infiltrate on exam or on x-ray evaluation I felt antibiotics were not necessary. Patient be discharged with prescription for oral prednisone along with continuing with her inhaler and Vicodin for cough.  Assessment: Acute bronchitis with bronchospasm   Final Clinical Impression:   Final diagnoses:  Bronchitis     Plan:  Outpatient management Patient was advised to return immediately if condition worsens. Patient was advised to follow up with their primary care physician or other specialized physicians involved in their outpatient care. The patient and/or family member/power of attorney had laboratory results reviewed at the bedside. All questions and concerns were addressed and appropriate discharge instructions were distributed by the nursing staff.             Jennye Moccasin, MD 12/26/15 1754

## 2015-12-27 ENCOUNTER — Telehealth: Payer: Self-pay | Admitting: Family Medicine

## 2015-12-27 MED ORDER — AZITHROMYCIN 250 MG PO TABS: ORAL_TABLET | ORAL | Status: AC

## 2015-12-27 NOTE — Telephone Encounter (Signed)
I'm sorry to hear she's not feeling well I just sent the Rx as requested Encourage her to get lots of rest and hydration Plain Mucinex may help her loosen up phlegm and help bring it up

## 2015-12-27 NOTE — Telephone Encounter (Signed)
Patient was seen in ER over the weekend and was diagnosed with bronchitis. Was told if she start seeing yellow sputum to give primary care a call to get antibotics. Patient currently do not transportation to come in to be seen. Please send prescription to [facility].

## 2016-01-21 ENCOUNTER — Encounter: Payer: Medicare Other | Admitting: Family Medicine

## 2016-02-07 ENCOUNTER — Other Ambulatory Visit: Payer: Self-pay

## 2016-02-08 MED ORDER — DULOXETINE HCL 60 MG PO CPEP: 120.0000 mg | ORAL_CAPSULE | Freq: Every day | ORAL | Status: DC

## 2016-02-09 ENCOUNTER — Ambulatory Visit (INDEPENDENT_AMBULATORY_CARE_PROVIDER_SITE_OTHER): Payer: Medicare Other | Admitting: Family Medicine

## 2016-02-09 ENCOUNTER — Encounter: Payer: Self-pay | Admitting: Family Medicine

## 2016-02-09 ENCOUNTER — Other Ambulatory Visit: Payer: Self-pay

## 2016-02-09 VITALS — BP 120/78 | HR 94 | Temp 98.4°F | Resp 16 | Wt 199.0 lb

## 2016-02-09 DIAGNOSIS — Z720 Tobacco use: Secondary | ICD-10-CM

## 2016-02-09 DIAGNOSIS — Z1211 Encounter for screening for malignant neoplasm of colon: Secondary | ICD-10-CM

## 2016-02-09 DIAGNOSIS — R946 Abnormal results of thyroid function studies: Secondary | ICD-10-CM

## 2016-02-09 DIAGNOSIS — M545 Low back pain: Secondary | ICD-10-CM

## 2016-02-09 DIAGNOSIS — M797 Fibromyalgia: Secondary | ICD-10-CM

## 2016-02-09 DIAGNOSIS — E669 Obesity, unspecified: Secondary | ICD-10-CM

## 2016-02-09 DIAGNOSIS — G8929 Other chronic pain: Secondary | ICD-10-CM | POA: Diagnosis not present

## 2016-02-09 DIAGNOSIS — R7989 Other specified abnormal findings of blood chemistry: Secondary | ICD-10-CM

## 2016-02-09 DIAGNOSIS — Z5181 Encounter for therapeutic drug level monitoring: Secondary | ICD-10-CM | POA: Insufficient documentation

## 2016-02-09 DIAGNOSIS — I10 Essential (primary) hypertension: Secondary | ICD-10-CM

## 2016-02-09 DIAGNOSIS — Z0000 Encounter for general adult medical examination without abnormal findings: Secondary | ICD-10-CM | POA: Diagnosis not present

## 2016-02-09 DIAGNOSIS — E785 Hyperlipidemia, unspecified: Secondary | ICD-10-CM | POA: Diagnosis not present

## 2016-02-09 DIAGNOSIS — Z1239 Encounter for other screening for malignant neoplasm of breast: Secondary | ICD-10-CM

## 2016-02-09 LAB — COMPLETE METABOLIC PANEL WITH GFR
ALT: 14 U/L (ref 6–29)
AST: 15 U/L (ref 10–35)
Albumin: 4.2 g/dL (ref 3.6–5.1)
Alkaline Phosphatase: 63 U/L (ref 33–130)
BUN: 21 mg/dL (ref 7–25)
CALCIUM: 9.6 mg/dL (ref 8.6–10.4)
CHLORIDE: 110 mmol/L (ref 98–110)
CO2: 25 mmol/L (ref 20–31)
Creat: 0.94 mg/dL (ref 0.50–1.05)
GFR, EST AFRICAN AMERICAN: 77 mL/min (ref >=60)
GFR, Est Non African American: 67 mL/min (ref >=60)
Glucose, Bld: 95 mg/dL (ref 65–99)
POTASSIUM: 4.9 mmol/L (ref 3.5–5.3)
Sodium: 143 mmol/L (ref 135–146)
Total Bilirubin: 0.2 mg/dL (ref 0.2–1.2)
Total Protein: 6.7 g/dL (ref 6.1–8.1)

## 2016-02-09 LAB — LIPID PANEL
CHOL/HDL RATIO: 3.6 ratio (ref <=5.0)
CHOLESTEROL: 182 mg/dL (ref 125–200)
HDL: 50 mg/dL (ref >=46)
LDL Cholesterol: 109 mg/dL (ref <130)
TRIGLYCERIDES: 113 mg/dL (ref <150)
VLDL: 23 mg/dL (ref <30)

## 2016-02-09 LAB — TSH: TSH: 3.4 m[IU]/L

## 2016-02-09 NOTE — Assessment & Plan Note (Signed)
USPSTF grade A and B recommendations reviewed with patient; age-appropriate recommendations, preventive care, screening tests, etc discussed and encouraged; healthy living encouraged; see AVS for patient education given to patient  

## 2016-02-09 NOTE — Assessment & Plan Note (Signed)
She declined colonoscopy, agrees to cologuard

## 2016-02-09 NOTE — Progress Notes (Signed)
Patient ID: Jenna Meyers, female   DOB: Jan 18, 1956, 60 y.o.   MRN: [id number]   Subjective:   Jenna Meyers is a 60 y.o. female here for a complete physical exam  Interim issues since last visit: went to pain clinic  USPSTF grade A and B recommendations Alcohol: four a year Depression:  Depression screen [facility] 2/9 02/09/2016  Decreased Interest 0  Down, Depressed, Hopeless 0  PHQ - 2 Score 0   Hypertension: controlled Obesity: cannot do exercise Tobacco use: current user, but trying to quit; using e-cig to try to stop; not using flavored HIV, hep B, hep C: politely declined HIV, hep B; already had the Hep C STD testing and prevention (chl/gon/syphilis): politely declined Lipids: check today Glucose: check today Colorectal cancer: declined colonoscopy, open to cologuard Breast cancer: due, no lumps BRCA gene screening: no ovarian, no breast Intimate partner violence: no abuse Cervical cancer screening: s/p hyst Lung cancer: smoked 1 ppd x 30+ years Osteoporosis: wait until 69 Fall prevention/vitamin D: fall prevention; not taking vit D, sunshine; three servings of calcium a day AAA: n/a Aspirin: start Diet: doesn't drink soda but does drink sweet tea; drinks water too; not much snacking, goes back and forth; sometimes hot dogs, no other processed meats; eats lots of cheese Exercise: not able to exercise b/c of pain; went to pain clinic, they were not familiar with [place] Skin cancer: moles on the back, runs in the family  Shingles vaccine already, 2014-ish Flu shots regularly  Past Medical History  Diagnosis Date  . Hyperlipidemia   . Arthritis     back  . Lumbago   . Fibromyalgia   . Vitamin D deficiency disease   . Depression   . Emphysema lung ([place])   . Tobacco abuse    Past Surgical History  Procedure Laterality Date  . Tonsillectomy and adenoidectomy  1962  . Cesarean section  1996  . Laparoscopic abdominal exploration  1999  . Abdominal hysterectomy   2000    partial, still has cervix   Family History  Problem Relation Age of Onset  . Cancer Mother     colon  . Osteoporosis Mother   . Hyperlipidemia Mother   . Alzheimer's disease Father   . Thyroid disease Son   . Heart disease Brother    Social History  Substance Use Topics  . Smoking status: Current Every Day Smoker -- 1.00 packs/day for 15 years    Types: Cigarettes  . Smokeless tobacco: Never Used  . Alcohol Use: No   Review of Systems  Constitutional: Negative for fever and unexpected weight change.  HENT: Negative for ear discharge and ear pain.   Eyes: Negative for visual disturbance (gets auras, has seen eye doctor, come up randomly; he's not worried about it).  Respiratory: Negative for cough and wheezing.   Cardiovascular: Negative for chest pain and leg swelling.  Gastrointestinal: Positive for diarrhea (maybe IBS, when overwhelming, gets loose stools, tried to stress). Negative for constipation and blood in stool.  Endocrine: Negative for polydipsia and polyuria.  Genitourinary: Negative for hematuria and pelvic pain.  Musculoskeletal: Positive for back pain and arthralgias (both hips, coccyx (broken twice), left shoulder (bursitis)). Negative for joint swelling.  Allergic/Immunologic: Negative for food allergies.  Neurological: Negative for tremors.  Hematological: Negative for adenopathy. Does not bruise/bleed easily.  Psychiatric/Behavioral: Negative for dysphoric mood.    Objective:   Filed Vitals:   02/09/16 0813  BP: 120/78  Pulse: 94  Temp: 98.4 F (36.9  C)  TempSrc: Oral  Resp: 16  Weight: 199 lb (90.266 kg)  SpO2: 97%   Body mass index is 33.12 kg/(m^2). Wt Readings from Last 3 Encounters:  02/09/16 199 lb (90.266 kg)  12/26/15 200 lb (90.719 kg)  09/03/15 195 lb (88.451 kg)   Physical Exam  Constitutional: She appears well-developed and well-nourished.  HENT:  Head: Normocephalic and atraumatic.  Right Ear: Hearing, tympanic  membrane, external ear and ear canal normal.  Left Ear: Hearing, tympanic membrane, external ear and ear canal normal.  Eyes: Conjunctivae and EOM are normal. Right eye exhibits no hordeolum. Left eye exhibits no hordeolum. No scleral icterus.  Neck: Carotid bruit is not present. No thyromegaly present.  Cardiovascular: Normal rate, regular rhythm, S1 normal, S2 normal and normal heart sounds.   No extrasystoles are present.  Pulmonary/Chest: Effort normal and breath sounds normal. No respiratory distress. Right breast exhibits no inverted nipple, no mass, no nipple discharge, no skin change and no tenderness. Left breast exhibits no inverted nipple, no mass, no nipple discharge, no skin change and no tenderness. Breasts are symmetrical.  Abdominal: Soft. Normal appearance and bowel sounds are normal. She exhibits no distension, no abdominal bruit, no pulsatile midline mass and no mass. There is no hepatosplenomegaly. There is no tenderness. No hernia.  Musculoskeletal: Normal range of motion. She exhibits no edema.  Lymphadenopathy:       Head (right side): No submandibular adenopathy present.       Head (left side): No submandibular adenopathy present.    She has no cervical adenopathy.    She has no axillary adenopathy.  Neurological: She is alert. She displays no tremor. No cranial nerve deficit. She exhibits normal muscle tone. Gait normal.  Reflex Scores:      Patellar reflexes are 2+ on the right side and 2+ on the left side. Skin: Skin is warm and dry. No bruising and no ecchymosis noted. No cyanosis. No pallor.  Psychiatric: Her speech is normal and behavior is normal. Thought content normal. Her mood appears not anxious. She does not exhibit a depressed mood.    Assessment/Plan:   Problem List Items Addressed This Visit      Cardiovascular and Mediastinum   Essential hypertension, benign    Controlled today        Musculoskeletal and Integument   Fibromyalgia    Refer to  pain clinic      Relevant Orders   Ambulatory referral to Pain Clinic     Other   Abnormal thyroid blood test    Check lab      Relevant Orders   TSH   Colon cancer screening    She declined colonoscopy, agrees to cologuard      Relevant Orders   Cologuard   Hyperlipidemia    Check lipid panel      Relevant Orders   Lipid panel   Lumbago - Primary    Refer to ortho and pain clinic      Relevant Orders   Ambulatory referral to Orthopedic Surgery   Ambulatory referral to Pain Clinic   Medication monitoring encounter   Relevant Orders   COMPLETE METABOLIC PANEL WITH GFR   Obesity    See AVS; refer to nutritionist      Relevant Orders   Amb ref to Medical Nutrition Therapy-MNT   Preventative health care    USPSTF grade A and B recommendations reviewed with patient; age-appropriate recommendations, preventive care, screening tests, etc discussed and encouraged; healthy  living encouraged; see AVS for patient education given to patient      Tobacco abuse    Schedule low dose chest CT      Relevant Orders   CT CHEST LUNG CA SCREEN LOW DOSE W/O CM    Other Visit Diagnoses    Breast cancer screening        Relevant Orders    MM DIGITAL SCREENING BILATERAL        No orders of the defined types were placed in this encounter.   Orders Placed This Encounter  Procedures  . MM DIGITAL SCREENING BILATERAL    Order Specific Question:  Reason for Exam (SYMPTOM  OR DIAGNOSIS REQUIRED)    Answer:  screening    Order Specific Question:  Is the patient pregnant?    Answer:  No    Order Specific Question:  Preferred imaging location?    Answer:  [facility]  . CT CHEST LUNG CA SCREEN LOW DOSE W/O CM    Order Specific Question:  Reason for Exam (SYMPTOM  OR DIAGNOSIS REQUIRED)    Answer:  tobacco abuse    Order Specific Question:  Is the patient pregnant?    Answer:  No    Order Specific Question:  Preferred Imaging Location?    Answer:  [facility]  . Cologuard  . Lipid panel    Standing Status: Future     Number of Occurrences:      Standing Expiration Date: 02/16/2016    Order Specific Question:  Has the patient fasted?    Answer:  Yes  . COMPLETE METABOLIC PANEL WITH GFR    Standing Status: Future     Number of Occurrences:      Standing Expiration Date: 02/16/2016  . TSH    Standing Status: Future     Number of Occurrences:      Standing Expiration Date: 02/16/2016  . Ambulatory referral to Orthopedic Surgery    Referral Priority:  Routine    Referral Type:  Surgical    Referral Reason:  Specialty Services Required    Requested Specialty:  Orthopedic Surgery    Number of Visits Requested:  1  . Ambulatory referral to Pain Clinic    Referral Priority:  Routine    Referral Type:  Consultation    Referral Reason:  Specialty Services Required    Requested Specialty:  Pain Medicine    Number of Visits Requested:  1  . Amb ref to Medical Nutrition Therapy-MNT    Referral Priority:  Routine    Referral Type:  Consultation    Referral Reason:  Specialty Services Required    Requested Specialty:  Nutrition    Number of Visits Requested:  1    Follow up plan: Return in about 1 year (around 02/08/2017) for complete physical, and problem based visit if desired.  An After Visit Summary was printed and given to the patient.

## 2016-02-09 NOTE — Patient Instructions (Addendum)
I do encourage you to quit smoking Call [phone] to sign up for smoking cessation classes You can call 1-800-QUIT-NOW to talk with a smoking cessation coach  Check out the information at familydoctor.org entitled "Nutrition for Weight Loss: What You Need to Know about Fad Diets" Try to lose between 1-2 pounds per week by taking in fewer calories and burning off more calories You can succeed by limiting portions, limiting foods dense in calories and fat, becoming more active, and drinking 8 glasses of water a day (64 ounces) Don't skip meals, especially breakfast, as skipping meals may alter your metabolism Do not use over-the-counter weight loss pills or gimmicks that claim rapid weight loss A healthy BMI (or body mass index) is between 18.5 and 24.9 You can calculate your ideal BMI at the [place] website [employer]fr  Start a baby coated 81 mg aspirin daily for stroke prevention  Start vitamin D3, take 2,000 iu daily for one month, then 1,000 iu daily  Return for a problem-based if desired  Return in one year for next preventive care  Health Maintenance, Female Adopting a healthy lifestyle and getting preventive care can go a long way to promote health and wellness. Talk with your health care provider about what schedule of regular examinations is right for you. This is a good chance for you to check in with your provider about disease prevention and staying healthy. In between checkups, there are plenty of things you can do on your own. Experts have done a lot of research about which lifestyle changes and preventive measures are most likely to keep you healthy. Ask your health care provider for more information. WEIGHT AND DIET  Eat a healthy diet  Be sure to include plenty of vegetables, fruits, low-fat dairy products, and lean protein.  Do not eat a lot of foods high in solid fats, added sugars, or salt.  Get regular exercise.  This is one of the most important things you can do for your health.  Most adults should exercise for at least 150 minutes each week. The exercise should increase your heart rate and make you sweat (moderate-intensity exercise).  Most adults should also do strengthening exercises at least twice a week. This is in addition to the moderate-intensity exercise.  Maintain a healthy weight  Body mass index (BMI) is a measurement that can be used to identify possible weight problems. It estimates body fat based on height and weight. Your health care provider can help determine your BMI and help you achieve or maintain a healthy weight.  For females 46 years of age and older:   A BMI below 18.5 is considered underweight.  A BMI of 18.5 to 24.9 is normal.  A BMI of 25 to 29.9 is considered overweight.  A BMI of 30 and above is considered obese.  Watch levels of cholesterol and blood lipids  You should start having your blood tested for lipids and cholesterol at 59 years of age, then have this test every 5 years.  You may need to have your cholesterol levels checked more often if:  Your lipid or cholesterol levels are high.  You are older than 59 years of age.  You are at high risk for heart disease.  CANCER SCREENING   Lung Cancer  Lung cancer screening is recommended for adults 39-63 years old who are at high risk for lung cancer because of a history of smoking.  A yearly low-dose CT scan of the lungs is recommended for people who:  Currently smoke.  Have quit within the past 15 years.  Have at least a 30-pack-year history of smoking. A pack year is smoking an average of one pack of cigarettes a day for 1 year.  Yearly screening should continue until it has been 15 years since you quit.  Yearly screening should stop if you develop a health problem that would prevent you from having lung cancer treatment.  Breast Cancer  Practice breast self-awareness. This means  understanding how your breasts normally appear and feel.  It also means doing regular breast self-exams. Let your health care provider know about any changes, no matter how small.  If you are in your 20s or 30s, you should have a clinical breast exam (CBE) by a health care provider every 1-3 years as part of a regular health exam.  If you are 32 or older, have a CBE every year. Also consider having a breast X-ray (mammogram) every year.  If you have a family history of breast cancer, talk to your health care provider about genetic screening.  If you are at high risk for breast cancer, talk to your health care provider about having an MRI and a mammogram every year.  Breast cancer gene (BRCA) assessment is recommended for women who have family members with BRCA-related cancers. BRCA-related cancers include:  Breast.  Ovarian.  Tubal.  Peritoneal cancers.  Results of the assessment will determine the need for genetic counseling and BRCA1 and BRCA2 testing. Cervical Cancer Your health care provider may recommend that you be screened regularly for cancer of the pelvic organs (ovaries, uterus, and vagina). This screening involves a pelvic examination, including checking for microscopic changes to the surface of your cervix (Pap test). You may be encouraged to have this screening done every 3 years, beginning at age 68.  For women ages 30-65, health care providers may recommend pelvic exams and Pap testing every 3 years, or they may recommend the Pap and pelvic exam, combined with testing for human papilloma virus (HPV), every 5 years. Some types of HPV increase your risk of cervical cancer. Testing for HPV may also be done on women of any age with unclear Pap test results.  Other health care providers may not recommend any screening for nonpregnant women who are considered low risk for pelvic cancer and who do not have symptoms. Ask your health care provider if a screening pelvic exam is right  for you.  If you have had past treatment for cervical cancer or a condition that could lead to cancer, you need Pap tests and screening for cancer for at least 20 years after your treatment. If Pap tests have been discontinued, your risk factors (such as having a new sexual partner) need to be reassessed to determine if screening should resume. Some women have medical problems that increase the chance of getting cervical cancer. In these cases, your health care provider may recommend more frequent screening and Pap tests. Colorectal Cancer  This type of cancer can be detected and often prevented.  Routine colorectal cancer screening usually begins at 59 years of age and continues through 59 years of age.  Your health care provider may recommend screening at an earlier age if you have risk factors for colon cancer.  Your health care provider may also recommend using home test kits to check for hidden blood in the stool.  A small camera at the end of a tube can be used to examine your colon directly (sigmoidoscopy or colonoscopy). This is  done to check for the earliest forms of colorectal cancer.  Routine screening usually begins at age 61.  Direct examination of the colon should be repeated every 5-10 years through 59 years of age. However, you may need to be screened more often if early forms of precancerous polyps or small growths are found. Skin Cancer  Check your skin from head to toe regularly.  Tell your health care provider about any new moles or changes in moles, especially if there is a change in a mole's shape or color.  Also tell your health care provider if you have a mole that is larger than the size of a pencil eraser.  Always use sunscreen. Apply sunscreen liberally and repeatedly throughout the day.  Protect yourself by wearing long sleeves, pants, a wide-brimmed hat, and sunglasses whenever you are outside. HEART DISEASE, DIABETES, AND HIGH BLOOD PRESSURE   High blood  pressure causes heart disease and increases the risk of stroke. High blood pressure is more likely to develop in:  People who have blood pressure in the high end of the normal range (130-139/85-89 mm Hg).  People who are overweight or obese.  People who are African American.  If you are 24-66 years of age, have your blood pressure checked every 3-5 years. If you are 14 years of age or older, have your blood pressure checked every year. You should have your blood pressure measured twice--once when you are at a hospital or clinic, and once when you are not at a hospital or clinic. Record the average of the two measurements. To check your blood pressure when you are not at a hospital or clinic, you can use:  An automated blood pressure machine at a pharmacy.  A home blood pressure monitor.  If you are between 67 years and 19 years old, ask your health care provider if you should take aspirin to prevent strokes.  Have regular diabetes screenings. This involves taking a blood sample to check your fasting blood sugar level.  If you are at a normal weight and have a low risk for diabetes, have this test once every three years after 59 years of age.  If you are overweight and have a high risk for diabetes, consider being tested at a younger age or more often. PREVENTING INFECTION  Hepatitis B  If you have a higher risk for hepatitis B, you should be screened for this virus. You are considered at high risk for hepatitis B if:  You were born in a country where hepatitis B is common. Ask your health care provider which countries are considered high risk.  Your parents were born in a high-risk country, and you have not been immunized against hepatitis B (hepatitis B vaccine).  You have HIV or AIDS.  You use needles to inject street drugs.  You live with someone who has hepatitis B.  You have had sex with someone who has hepatitis B.  You get hemodialysis treatment.  You take certain  medicines for conditions, including cancer, organ transplantation, and autoimmune conditions. Hepatitis C  Blood testing is recommended for:  Everyone born from [birth year] through June 07, 1963.  Anyone with known risk factors for hepatitis C. Sexually transmitted infections (STIs)  You should be screened for sexually transmitted infections (STIs) including gonorrhea and chlamydia if:  You are sexually active and are younger than 59 years of age.  You are older than 59 years of age and your health care provider tells you that you are at risk  for this type of infection.  Your sexual activity has changed since you were last screened and you are at an increased risk for chlamydia or gonorrhea. Ask your health care provider if you are at risk.  If you do not have HIV, but are at risk, it may be recommended that you take a prescription medicine daily to prevent HIV infection. This is called pre-exposure prophylaxis (PrEP). You are considered at risk if:  You are sexually active and do not regularly use condoms or know the HIV status of your partner(s).  You take drugs by injection.  You are sexually active with a partner who has HIV. Talk with your health care provider about whether you are at high risk of being infected with HIV. If you choose to begin PrEP, you should first be tested for HIV. You should then be tested every 3 months for as long as you are taking PrEP.  PREGNANCY   If you are premenopausal and you may become pregnant, ask your health care provider about preconception counseling.  If you may become pregnant, take 400 to 800 micrograms (mcg) of folic acid every day.  If you want to prevent pregnancy, talk to your health care provider about birth control (contraception). OSTEOPOROSIS AND MENOPAUSE   Osteoporosis is a disease in which the bones lose minerals and strength with aging. This can result in serious bone fractures. Your risk for osteoporosis can be identified using a bone  density scan.  If you are 44 years of age or older, or if you are at risk for osteoporosis and fractures, ask your health care provider if you should be screened.  Ask your health care provider whether you should take a calcium or vitamin D supplement to lower your risk for osteoporosis.  Menopause may have certain physical symptoms and risks.  Hormone replacement therapy may reduce some of these symptoms and risks. Talk to your health care provider about whether hormone replacement therapy is right for you.  HOME CARE INSTRUCTIONS   Schedule regular health, dental, and eye exams.  Stay current with your immunizations.   Do not use any tobacco products including cigarettes, chewing tobacco, or electronic cigarettes.  If you are pregnant, do not drink alcohol.  If you are breastfeeding, limit how much and how often you drink alcohol.  Limit alcohol intake to no more than 1 drink per day for nonpregnant women. One drink equals 12 ounces of beer, 5 ounces of wine, or 1 ounces of hard liquor.  Do not use street drugs.  Do not share needles.  Ask your health care provider for help if you need support or information about quitting drugs.  Tell your health care provider if you often feel depressed.  Tell your health care provider if you have ever been abused or do not feel safe at home.   This information is not intended to replace advice given to you by your health care provider. Make sure you discuss any questions you have with your health care provider.   Document Released: 02/20/2011 Document Revised: 08/28/2014 Document Reviewed: 07/09/2013 Elsevier Interactive Patient Education [employer].

## 2016-02-09 NOTE — Assessment & Plan Note (Signed)
Controlled today 

## 2016-02-09 NOTE — Assessment & Plan Note (Signed)
Check lipid panel  

## 2016-02-09 NOTE — Assessment & Plan Note (Signed)
Schedule low dose chest CT

## 2016-02-09 NOTE — Assessment & Plan Note (Signed)
Refer to pain clinic? 

## 2016-02-09 NOTE — Assessment & Plan Note (Signed)
Refer to ortho and pain clinic 

## 2016-02-09 NOTE — Assessment & Plan Note (Signed)
See AVS; refer to nutritionist

## 2016-02-09 NOTE — Assessment & Plan Note (Signed)
Check lab

## 2016-02-10 ENCOUNTER — Other Ambulatory Visit: Payer: Self-pay | Admitting: Family Medicine

## 2016-02-10 MED ORDER — ATORVASTATIN CALCIUM 40 MG PO TABS: 40.0000 mg | ORAL_TABLET | Freq: Every day | ORAL | Status: DC

## 2016-02-24 ENCOUNTER — Telehealth: Payer: Self-pay | Admitting: *Deleted

## 2016-02-24 NOTE — Telephone Encounter (Signed)
Received referral for low dose lung cancer screening CT scan. Attempted to leave voicemail at phone number listed in EMR for patient to call me back to facilitate scheduling scan. However, no voicemail is set up. Will try again at later date.

## 2016-02-25 ENCOUNTER — Telehealth: Payer: Self-pay | Admitting: *Deleted

## 2016-02-25 NOTE — Telephone Encounter (Signed)
Attempted to contact patient again to facilitate lung cancer screening scan. Patient's phone does not have voicemail set up.

## 2016-03-03 ENCOUNTER — Encounter: Payer: Medicare Other | Admitting: Family Medicine

## 2016-04-18 ENCOUNTER — Telehealth: Payer: Self-pay | Admitting: *Deleted

## 2016-04-18 NOTE — Telephone Encounter (Signed)
Received referral for low dose CT lung cancer screening. Despite multiple attempts at all contact numbers available, have not been able to arrange for shared decision making visit and CT scan. Letter mailed to patient in final attempt to contact patient. I will be happy to assist in the future if patient so desires. Will forward to referring provider.  

## 2016-04-18 NOTE — Telephone Encounter (Signed)
error 

## 2016-06-28 ENCOUNTER — Emergency Department: Payer: Medicare Other

## 2016-06-28 ENCOUNTER — Emergency Department
Admission: EM | Admit: 2016-06-28 | Discharge: 2016-06-28 | Disposition: A | Discharge status: Home / Self Care | Payer: Medicare Other | Attending: Emergency Medicine | Admitting: Emergency Medicine

## 2016-06-28 ENCOUNTER — Encounter: Payer: Self-pay | Admitting: Emergency Medicine

## 2016-06-28 DIAGNOSIS — Z79899 Other long term (current) drug therapy: Secondary | ICD-10-CM | POA: Diagnosis not present

## 2016-06-28 DIAGNOSIS — F17210 Nicotine dependence, cigarettes, uncomplicated: Secondary | ICD-10-CM | POA: Diagnosis not present

## 2016-06-28 DIAGNOSIS — R05 Cough: Secondary | ICD-10-CM

## 2016-06-28 DIAGNOSIS — R0602 Shortness of breath: Secondary | ICD-10-CM | POA: Diagnosis present

## 2016-06-28 DIAGNOSIS — I10 Essential (primary) hypertension: Secondary | ICD-10-CM | POA: Diagnosis not present

## 2016-06-28 DIAGNOSIS — Z791 Long term (current) use of non-steroidal anti-inflammatories (NSAID): Secondary | ICD-10-CM | POA: Insufficient documentation

## 2016-06-28 DIAGNOSIS — J441 Chronic obstructive pulmonary disease with (acute) exacerbation: Secondary | ICD-10-CM | POA: Diagnosis not present

## 2016-06-28 DIAGNOSIS — R059 Cough, unspecified: Secondary | ICD-10-CM

## 2016-06-28 LAB — COMPREHENSIVE METABOLIC PANEL
ALBUMIN: 4.1 g/dL (ref 3.5–5.0)
ALT: 16 U/L (ref 14–54)
ANION GAP: 9 (ref 5–15)
AST: 22 U/L (ref 15–41)
Alkaline Phosphatase: 56 U/L (ref 38–126)
BILIRUBIN TOTAL: 0.2 mg/dL — AB (ref 0.3–1.2)
BUN: 16 mg/dL (ref 6–20)
CHLORIDE: 111 mmol/L (ref 101–111)
CO2: 22 mmol/L (ref 22–32)
Calcium: 9.1 mg/dL (ref 8.9–10.3)
Creatinine, Ser: 1.03 mg/dL — ABNORMAL HIGH (ref 0.44–1.00)
GFR calc Af Amer: >60 mL/min (ref >60)
GFR, EST NON AFRICAN AMERICAN: 58 mL/min — AB (ref >60)
GLUCOSE: 116 mg/dL — AB (ref 65–99)
POTASSIUM: 3.7 mmol/L (ref 3.5–5.1)
Sodium: 142 mmol/L (ref 135–145)
TOTAL PROTEIN: 7 g/dL (ref 6.5–8.1)

## 2016-06-28 LAB — CBC
HEMATOCRIT: 38.8 % (ref 35.0–47.0)
Hemoglobin: 13.6 g/dL (ref 12.0–16.0)
MCH: 31.6 pg (ref 26.0–34.0)
MCHC: 35 g/dL (ref 32.0–36.0)
MCV: 90.4 fL (ref 80.0–100.0)
PLATELETS: 310 10*3/uL (ref 150–440)
RBC: 4.29 MIL/uL (ref 3.80–5.20)
RDW: 13.4 % (ref 11.5–14.5)
WBC: 10.7 10*3/uL (ref 3.6–11.0)

## 2016-06-28 LAB — TROPONIN I

## 2016-06-28 MED ORDER — METHYLPREDNISOLONE SODIUM SUCC 125 MG IJ SOLR
125.0000 mg | Freq: Once | INTRAMUSCULAR | Status: AC
  Administered 2016-06-28: 125 mg via INTRAVENOUS
  Filled 2016-06-28: qty 2

## 2016-06-28 MED ORDER — HYDROCOD POLST-CPM POLST ER 10-8 MG/5ML PO SUER
5.0000 mL | Freq: Once | ORAL | Status: AC
  Administered 2016-06-28: 5 mL via ORAL
  Filled 2016-06-28: qty 5

## 2016-06-28 MED ORDER — PREDNISONE 20 MG PO TABS: ORAL_TABLET | ORAL | 0 refills | Status: DC

## 2016-06-28 MED ORDER — HYDROCOD POLST-CPM POLST ER 10-8 MG/5ML PO SUER: 5.0000 mL | Freq: Two times a day (BID) | ORAL | 0 refills | Status: DC

## 2016-06-28 NOTE — ED Notes (Signed)
Pt given lemon swabs after being assisted to toilet in room. Pt ambulated with a steady gait.

## 2016-06-28 NOTE — ED Triage Notes (Addendum)
Pt arrived via ems from home with complaints of persistent cough that lead to shortness of breath. Pt estimates she took 7 breathing treatments within 2 hours with no relief. Pt is able to communicate and oxygen saturation is 99% on room air. Pt has a hx of emphysema and states she has had similar episodes in the past.

## 2016-06-28 NOTE — ED Provider Notes (Signed)
[facility] Emergency Department Provider Note   ____________________________________________   First MD Initiated Contact with Patient 06/28/16 [phone]     (approximate)  I have reviewed the triage vital signs and the nursing notes.   HISTORY  Chief Complaint Cough and Shortness of Breath    HPI Jenna PennerKatherine Meyers is a 60 y.o. female brought to the ED from home via EMS with a chief complaint of persistent nonproductive cough and shortness of breath. Patient has a history ofCOPD who states she took approximately 7 breathing treatments within a short amount of time. Denies associated fever, chills, chest pain, abdominal pain, nausea, vomiting, diarrhea. Denies recent travel or trauma. Nothing makes her symptoms better or worse.   Past Medical History:  Diagnosis Date  . Arthritis    back  . Depression   . Emphysema lung (HCC)   . Fibromyalgia   . Hyperlipidemia   . Lumbago   . Tobacco abuse   . Vitamin D deficiency disease     Patient Active Problem List   Diagnosis Date Noted  . Medication monitoring encounter 02/09/2016  . Preventative health care 02/09/2016  . Colon cancer screening 02/09/2016  . Need for hepatitis C screening test 09/03/2015  . Essential hypertension, benign 05/17/2015  . Acute allergic reaction 05/17/2015  . Situational stress 05/17/2015  . Obesity 03/03/2015  . Abnormal thyroid blood test 03/03/2015  . Tobacco abuse 03/03/2015  . Hyperlipidemia   . Lumbago   . Fibromyalgia   . Vitamin D deficiency disease     Past Surgical History:  Procedure Laterality Date  . ABDOMINAL HYSTERECTOMY  2000   partial, still has cervix  . CESAREAN SECTION  1996  . LAPAROSCOPIC ABDOMINAL EXPLORATION  1999  . TONSILLECTOMY AND ADENOIDECTOMY  1962    Prior to Admission medications   Medication Sig Start Date End Date Taking? Authorizing Provider  albuterol (PROVENTIL HFA) 108 (90 Base) MCG/ACT inhaler Inhale 2 puffs into the lungs  every 4 (four) hours as needed for wheezing or shortness of breath. 09/03/15   Kerman PasseyMelinda P Lada, MD  atorvastatin (LIPITOR) 40 MG tablet Take 1 tablet (40 mg total) by mouth at bedtime. 02/10/16   Kerman PasseyMelinda P Lada, MD  chlorpheniramine-HYDROcodone (TUSSIONEX PENNKINETIC ER) 10-8 MG/5ML SUER Take 5 mLs by mouth 2 (two) times daily. 06/28/16   Irean HongJade J Julyana Woolverton, MD  DULoxetine (CYMBALTA) 60 MG capsule Take 2 capsules (120 mg total) by mouth daily. 02/08/16   Kerman PasseyMelinda P Lada, MD  naproxen (NAPROSYN) 250 MG tablet Take 220 mg by mouth 2 (two) times daily with a meal.    Historical Provider, MD  predniSONE (DELTASONE) 20 MG tablet 3 tablets daily x 4 days 06/28/16   Irean HongJade J Letoya Stallone, MD    Allergies Patient has no known allergies.  Family History  Problem Relation Age of Onset  . Cancer Mother     colon  . Osteoporosis Mother   . Hyperlipidemia Mother   . Alzheimer's disease Father   . Thyroid disease Son   . Heart disease Brother     Social History Social History  Substance Use Topics  . Smoking status: Current Every Day Smoker    Packs/day: 1.00    Years: 15.00    Types: Cigarettes  . Smokeless tobacco: Never Used  . Alcohol use No    Review of Systems  Constitutional: No fever/chills. Eyes: No visual changes. ENT: No sore throat. Cardiovascular: Denies chest pain. Respiratory: Positive for cough and shortness of breath.  Gastrointestinal: No abdominal pain.  No nausea, no vomiting.  No diarrhea.  No constipation. Genitourinary: Negative for dysuria. Musculoskeletal: Negative for back pain. Skin: Negative for rash. Neurological: Negative for headaches, focal weakness or numbness.  10-point ROS otherwise negative.  ____________________________________________   PHYSICAL EXAM:  VITAL SIGNS: ED Triage Vitals  Enc Vitals Group     BP 06/28/16 0221 104/69     Pulse Rate 06/28/16 0221 78     Resp 06/28/16 0221 (!) 22     Temp 06/28/16 0221 98.7 F (37.1 C)     Temp Source 06/28/16 0221  Oral     SpO2 06/28/16 0221 99 %     Weight 06/28/16 0221 200 lb (90.7 kg)     Height 06/28/16 0221 5\' 4"  (1.626 m)     Head Circumference --      Peak Flow --      Pain Score 06/28/16 0222 3     Pain Loc --      Pain Edu? --      Excl. in GC? --     Constitutional: Alert and oriented. Well appearing and in no acute distress. Eyes: Conjunctivae are normal. PERRL. EOMI. Head: Atraumatic. Nose: No congestion/rhinnorhea. Mouth/Throat: Mucous membranes are moist.  Oropharynx non-erythematous. Neck: No stridor.   Cardiovascular: Normal rate, regular rhythm. Grossly normal heart sounds.  Good peripheral circulation. Respiratory: Normal respiratory effort.  No retractions. Lungs CTAB. Good aeration. Gastrointestinal: Soft and nontender. No distention. No abdominal bruits. No CVA tenderness. Musculoskeletal: No lower extremity tenderness nor edema.  No joint effusions. Neurologic:  Normal speech and language. No gross focal neurologic deficits are appreciated. No gait instability. Skin:  Skin is warm, dry and intact. No rash noted. Psychiatric: Mood and affect are normal. Speech and behavior are normal.  ____________________________________________   LABS (all labs ordered are listed, but only abnormal results are displayed)  Labs Reviewed  COMPREHENSIVE METABOLIC PANEL - Abnormal; Notable for the following:       Result Value   Glucose, Bld 116 (*)    Creatinine, Ser 1.03 (*)    Total Bilirubin 0.2 (*)    GFR calc non Af Amer 58 (*)    All other components within normal limits  CBC  TROPONIN I   ____________________________________________  EKG  ED ECG REPORT I, Lalana Wachter J, the attending physician, personally viewed and interpreted this ECG.   Date: 06/28/2016  EKG Time: 0225  Rate: 78  Rhythm: normal EKG, normal sinus rhythm  Axis: Normal  Intervals:none  ST&T Change: Nonspecific  ____________________________________________  RADIOLOGY  Chest x-ray (viewed by  me, interpreted per Dr. Sterling Big): No pneumonic consolidations however there is probable mild central  bronchitic change.     ____________________________________________   PROCEDURES  Procedure(s) performed: None  Procedures  Critical Care performed: No  ____________________________________________   INITIAL IMPRESSION / ASSESSMENT AND PLAN / ED COURSE  Pertinent labs & imaging results that were available during my care of the patient were reviewed by me and considered in my medical decision making (see chart for details).  60 year old female with COPD who presents with nonproductive cough. Patient reports her last 2 albuterol MDI have completely relieved her symptoms. Will initiate prednisone burst, Tussionex and patient will follow-up closely with her PCP. Strict return precautions given. Patient verbalizes understanding and agrees with plan of care.  Clinical Course      ____________________________________________   FINAL CLINICAL IMPRESSION(S) / ED DIAGNOSES  Final diagnoses:  COPD exacerbation (HCC)  Cough  NEW MEDICATIONS STARTED DURING THIS VISIT:  New Prescriptions   CHLORPHENIRAMINE-HYDROCODONE (TUSSIONEX PENNKINETIC ER) 10-8 MG/5ML SUER    Take 5 mLs by mouth 2 (two) times daily.   PREDNISONE (DELTASONE) 20 MG TABLET    3 tablets daily x 4 days     Note:  This document was prepared using Dragon voice recognition software and may include unintentional dictation errors.    Irean HongJade J Elizabeth Haff, MD 06/28/16 [phone]

## 2016-06-28 NOTE — Discharge Instructions (Signed)
1. Finish prednisone 60 mg daily 4 days. Start your next dose on Thursday. 2. You may take cough medicine as needed (Tussionex). 3. Return to the ER for worsening symptoms, persistent vomiting, difficulty breathing or other concerns.

## 2016-07-04 ENCOUNTER — Emergency Department: Payer: Medicare Other

## 2016-07-04 ENCOUNTER — Emergency Department
Admission: EM | Admit: 2016-07-04 | Discharge: 2016-07-04 | Disposition: A | Discharge status: Home / Self Care | Payer: Medicare Other | Attending: Emergency Medicine | Admitting: Emergency Medicine

## 2016-07-04 ENCOUNTER — Encounter: Payer: Self-pay | Admitting: *Deleted

## 2016-07-04 DIAGNOSIS — Z79899 Other long term (current) drug therapy: Secondary | ICD-10-CM | POA: Diagnosis not present

## 2016-07-04 DIAGNOSIS — I10 Essential (primary) hypertension: Secondary | ICD-10-CM | POA: Insufficient documentation

## 2016-07-04 DIAGNOSIS — M25511 Pain in right shoulder: Secondary | ICD-10-CM | POA: Diagnosis present

## 2016-07-04 DIAGNOSIS — F17210 Nicotine dependence, cigarettes, uncomplicated: Secondary | ICD-10-CM | POA: Diagnosis not present

## 2016-07-04 MED ORDER — OXYCODONE-ACETAMINOPHEN 5-325 MG PO TABS: 1.0000 | ORAL_TABLET | Freq: Three times a day (TID) | ORAL | 0 refills | Status: AC | PRN

## 2016-07-04 MED ORDER — MELOXICAM 7.5 MG PO TABS: 7.5000 mg | ORAL_TABLET | Freq: Every day | ORAL | 2 refills | Status: DC

## 2016-07-04 MED ORDER — PREDNISONE 10 MG (21) PO TBPK: 10.0000 mg | ORAL_TABLET | Freq: Every day | ORAL | 0 refills | Status: DC

## 2016-07-04 NOTE — ED Triage Notes (Signed)
Pt complains of right shoulder pain, pt reports a history of bursitis

## 2016-07-04 NOTE — ED Notes (Signed)
Gave patient some juice 

## 2016-07-04 NOTE — ED Provider Notes (Signed)
[facility] Emergency Department Provider Note  ____________________________________________   First MD Initiated Contact with Patient 07/04/16 [phone]     (approximate)  I have reviewed the triage vital signs and the nursing notes.   HISTORY  Chief Complaint Shoulder Pain    HPI Jenna Meyers is a 60 y.o. female presenting with right shoulder pain for the past 3 days. Patient rates pain at 7/10 and describes it as non-radiating, constant, sharp and stabbing. She denies radiculopathy and incidences of trauma. She has used heat, ice and tumeric, which has provided minimal relief. Patient's pain is intensified with flexion of the shoulder. She denies incidences of trauma to the shoulder or previous shoulder surgeries. Patient is right-handed.   Past Medical History:  Diagnosis Date  . Arthritis    back  . Depression   . Emphysema lung (HCC)   . Fibromyalgia   . Hyperlipidemia   . Lumbago   . Tobacco abuse   . Vitamin D deficiency disease     Patient Active Problem List   Diagnosis Date Noted  . Medication monitoring encounter 02/09/2016  . Preventative health care 02/09/2016  . Colon cancer screening 02/09/2016  . Need for hepatitis C screening test 09/03/2015  . Essential hypertension, benign 05/17/2015  . Acute allergic reaction 05/17/2015  . Situational stress 05/17/2015  . Obesity 03/03/2015  . Abnormal thyroid blood test 03/03/2015  . Tobacco abuse 03/03/2015  . Hyperlipidemia   . Lumbago   . Fibromyalgia   . Vitamin D deficiency disease     Past Surgical History:  Procedure Laterality Date  . ABDOMINAL HYSTERECTOMY  2000   partial, still has cervix  . CESAREAN SECTION  1996  . LAPAROSCOPIC ABDOMINAL EXPLORATION  1999  . TONSILLECTOMY AND ADENOIDECTOMY  1962    Prior to Admission medications   Medication Sig Start Date End Date Taking? Authorizing Provider  albuterol (PROVENTIL HFA) 108 (90 Base) MCG/ACT inhaler Inhale 2 puffs  into the lungs every 4 (four) hours as needed for wheezing or shortness of breath. 09/03/15   Kerman Passey, MD  atorvastatin (LIPITOR) 40 MG tablet Take 1 tablet (40 mg total) by mouth at bedtime. 02/10/16   Kerman Passey, MD  chlorpheniramine-HYDROcodone (TUSSIONEX PENNKINETIC ER) 10-8 MG/5ML SUER Take 5 mLs by mouth 2 (two) times daily. 06/28/16   Irean Hong, MD  DULoxetine (CYMBALTA) 60 MG capsule Take 2 capsules (120 mg total) by mouth daily. 02/08/16   Kerman Passey, MD  naproxen (NAPROSYN) 250 MG tablet Take 220 mg by mouth 2 (two) times daily with a meal.    Historical Provider, MD  predniSONE (DELTASONE) 20 MG tablet 3 tablets daily x 4 days 06/28/16   Irean Hong, MD    Allergies Patient has no known allergies.  Family History  Problem Relation Age of Onset  . Cancer Mother     colon  . Osteoporosis Mother   . Hyperlipidemia Mother   . Alzheimer's disease Father   . Thyroid disease Son   . Heart disease Brother     Social History Social History  Substance Use Topics  . Smoking status: Current Every Day Smoker    Packs/day: 1.00    Years: 15.00    Types: Cigarettes  . Smokeless tobacco: Never Used  . Alcohol use No    Review of Systems Constitutional: No fever/chills Eyes: No visual changes. ENT: No sore throat. Cardiovascular: Denies chest pain. Respiratory: Denies shortness of breath. Gastrointestinal: No abdominal  pain.  No nausea, no vomiting.  No diarrhea.  No constipation. Genitourinary: Negative for dysuria. Musculoskeletal: Patient has shoulder pain. Skin: Negative for rash. Neurological: Negative for headaches, focal weakness or numbness.  10-point ROS otherwise negative.  ____________________________________________   PHYSICAL EXAM:  VITAL SIGNS: ED Triage Vitals  Enc Vitals Group     BP 07/04/16 0837 (!) 157/85     Pulse Rate 07/04/16 0837 92     Resp 07/04/16 0837 20     Temp 07/04/16 0837 98.1 F (36.7 C)     Temp Source 07/04/16 0837  Oral     SpO2 07/04/16 0837 96 %     Weight 07/04/16 0835 200 lb (90.7 kg)     Height 07/04/16 0835 5\' 4"  (1.626 m)     Head Circumference --      Peak Flow --      Pain Score 07/04/16 0835 8     Pain Loc --      Pain Edu? --      Excl. in GC? --     Constitutional: Alert and oriented. Well appearing and in no acute distress. Eyes: Conjunctivae are normal. PERRL. EOMI. Neck: No stridor. Full range of motion Hematological/Lymphatic/Immunilogical: No cervical lymphadenopathy. Cardiovascular: Normal rate, regular rhythm. Grossly normal heart sounds.  Good peripheral circulation. Respiratory: Normal respiratory effort.  No retractions. Lungs CTAB. Gastrointestinal: Soft and nontender. No distention. No abdominal bruits. No CVA tenderness. Musculoskeletal: Patient has 2 out of 5 strength in the right upper extremity. 5 out of 5 strength in the left upper extremity. Patient is unable to exhibit full range of motion at the right shoulder due to pain. Patient is tender to palpation along the right deltoid. Patient exhibits weakness when assessing the supraspinatus. Neurologic:  Normal speech and language. No gross focal neurologic deficits are appreciated. No gait instability. Skin:  Skin is warm, dry and intact. No rash noted. Psychiatric: Mood and affect are normal. Speech and behavior are normal.  ____________________________________________   LABS (all labs ordered are listed, but only abnormal results are displayed)  Labs Reviewed - No data to display ____________________________________________   RADIOLOGY  I, Orvil FeilJaclyn M Deen Deguia, personally viewed and evaluated these images (plain radiographs) as part of my medical decision making, as well as reviewing the written report by the radiologist.  Shoulder x-rays: No fractures or bony lesions.   PROCEDURES   Procedures None:   ____________________________________________   INITIAL IMPRESSION / ASSESSMENT AND PLAN / ED  COURSE  Pertinent labs & imaging results that were available during my care of the patient were reviewed by me and considered in my medical decision making (see chart for details).    Clinical Course    Assessment: Right shoulder pain Differential diagnosis includes bursitis and degenerative partial/total tear of the right rotator cuff. Patient has experienced right shoulder pain for the past 3 days unprovoked by activity or trauma. Patient has not been actively using the right upper extremity due to pain. Patient has weakness when testing the supraspinatus. I suspect the supraspinatus is torn. Patient was started on an oral steroid. Patient was also prescribed a 3 day course of Percocet to be used as needed for pain. She was referred to the orthopedist on-call, Dr. Hyacinth MeekerMiller. I also prescribed patient Mobic to be used after the oral steroid course is finished. All patient questions were answered.  ____________________________________________   FINAL CLINICAL IMPRESSION(S) / ED DIAGNOSES  Final diagnoses:  None      NEW MEDICATIONS  STARTED DURING THIS VISIT:  New Prescriptions   No medications on file     Note:  This document was prepared using Dragon voice recognition software and may include unintentional dictation errors.    Orvil FeilJaclyn M Suhana Wilner, PA-C 07/04/16 1020    Sharyn CreamerMark Quale, MD 07/07/16 1021

## 2016-07-24 ENCOUNTER — Other Ambulatory Visit: Payer: Self-pay | Admitting: Family Medicine

## 2016-09-23 ENCOUNTER — Telehealth: Payer: Self-pay | Admitting: Family Medicine

## 2016-09-23 NOTE — Telephone Encounter (Signed)
Patient has some health maintenance care gaps Please contact her about her mammogram, colon cancer screening, low dose chest CT for lung cancer screening, HIV screening, and flu shot Thank you

## 2016-09-25 NOTE — Telephone Encounter (Signed)
Left a message with pt spouse to have her give me a call back

## 2016-10-12 NOTE — Telephone Encounter (Signed)
Try to reach patient regarding some health maintencance gaps in her chart. I was not successful. This will be my second time calling therefore I will go ahead and address the health maintenance concerns via letter. I will also send out [place] breast card and cologurad pamphlets alone with the letter to give pt some info.

## 2016-10-12 NOTE — Telephone Encounter (Signed)
Please follow up on this again

## 2017-01-01 DIAGNOSIS — F17210 Nicotine dependence, cigarettes, uncomplicated: Secondary | ICD-10-CM | POA: Insufficient documentation

## 2017-01-01 DIAGNOSIS — Z5321 Procedure and treatment not carried out due to patient leaving prior to being seen by health care provider: Secondary | ICD-10-CM | POA: Diagnosis not present

## 2017-01-01 DIAGNOSIS — M545 Low back pain: Secondary | ICD-10-CM | POA: Diagnosis not present

## 2017-01-01 NOTE — ED Triage Notes (Signed)
Pt presents to ED via POV in a w/c with c/o lower back pain x1 week. Pt reports chronic h/x of same and is currently on disability. Pt denies radiation of pain into lower extremities, no loss of bowel or bladder control. Pt prescribed Cymbalta and Meloxicam, but not prescibed any narcotics "for like a year and a half".

## 2017-01-02 ENCOUNTER — Emergency Department
Admission: EM | Admit: 2017-01-02 | Discharge: 2017-01-02 | Discharge status: Against Medical Advice | Payer: Medicare Other | Attending: Emergency Medicine | Admitting: Emergency Medicine

## 2017-02-12 ENCOUNTER — Encounter: Payer: Medicare Other | Admitting: Family Medicine

## 2017-02-15 ENCOUNTER — Emergency Department
Admission: EM | Admit: 2017-02-15 | Discharge: 2017-02-15 | Disposition: A | Discharge status: Home / Self Care | Payer: Medicare Other | Attending: Emergency Medicine | Admitting: Emergency Medicine

## 2017-02-15 ENCOUNTER — Encounter: Payer: Self-pay | Admitting: Emergency Medicine

## 2017-02-15 DIAGNOSIS — F17210 Nicotine dependence, cigarettes, uncomplicated: Secondary | ICD-10-CM | POA: Diagnosis not present

## 2017-02-15 DIAGNOSIS — M5440 Lumbago with sciatica, unspecified side: Secondary | ICD-10-CM | POA: Insufficient documentation

## 2017-02-15 DIAGNOSIS — M25551 Pain in right hip: Secondary | ICD-10-CM | POA: Diagnosis present

## 2017-02-15 DIAGNOSIS — M5431 Sciatica, right side: Secondary | ICD-10-CM

## 2017-02-15 DIAGNOSIS — I10 Essential (primary) hypertension: Secondary | ICD-10-CM | POA: Insufficient documentation

## 2017-02-15 DIAGNOSIS — M5432 Sciatica, left side: Secondary | ICD-10-CM

## 2017-02-15 DIAGNOSIS — Z79899 Other long term (current) drug therapy: Secondary | ICD-10-CM | POA: Diagnosis not present

## 2017-02-15 MED ORDER — PREDNISONE 10 MG (21) PO TBPK: ORAL_TABLET | ORAL | 0 refills | Status: DC

## 2017-02-15 MED ORDER — METHYLPREDNISOLONE SODIUM SUCC 125 MG IJ SOLR
125.0000 mg | Freq: Once | INTRAMUSCULAR | Status: AC
  Administered 2017-02-15: 125 mg via INTRAMUSCULAR
  Filled 2017-02-15: qty 2

## 2017-02-15 MED ORDER — CYCLOBENZAPRINE HCL 5 MG PO TABS: 5.0000 mg | ORAL_TABLET | Freq: Three times a day (TID) | ORAL | 0 refills | Status: AC | PRN

## 2017-02-15 MED ORDER — OXYCODONE-ACETAMINOPHEN 5-325 MG PO TABS
1.0000 | ORAL_TABLET | Freq: Once | ORAL | Status: AC
Start: 2017-02-15 — End: 2017-02-15
  Administered 2017-02-15: 1 via ORAL
  Filled 2017-02-15: qty 1

## 2017-02-15 MED ORDER — LIDOCAINE 5 % EX PTCH
1.0000 | MEDICATED_PATCH | CUTANEOUS | Status: DC
  Administered 2017-02-15: 1 via TRANSDERMAL
  Filled 2017-02-15: qty 1

## 2017-02-15 MED ORDER — KETOROLAC TROMETHAMINE 30 MG/ML IJ SOLN
30.0000 mg | Freq: Once | INTRAMUSCULAR | Status: AC
  Administered 2017-02-15: 30 mg via INTRAVENOUS
  Filled 2017-02-15: qty 1

## 2017-02-15 NOTE — ED Triage Notes (Signed)
Patient presents to the ED with severe right hip pain that has been progressing over the past 2 months but with much more intensity and pain over the past 24 hours and lower back pain for the past 5 days.  Patient has chronic pain, fibromyalgia, and arthritis.  Patient states if she moves at all the pain is "excrutiating."  Patient states she tried to go to [place] [facility] and patient states, "they wouldn't help me."  Patient is tearful in triage.

## 2017-02-15 NOTE — ED Notes (Addendum)
Increasing Right hip pain x 2 months. Increasing Back pain x 1 week. Tingling down right leg. +2 pulses. Denies injury. Pain 10/10. Pt with chronic pain.

## 2017-02-15 NOTE — ED Provider Notes (Signed)
[facility] Emergency Department Provider Note  ____________________________________________  Time seen: Approximately 1:13 PM  I have reviewed the triage vital signs and the nursing notes.   HISTORY  Chief Complaint Pain and Hip Pain    HPI Jenna Meyers is a 61 y.o. female that presents to the emergency department with hip pain for 2 months and low back pain for 2 days. Pain begins in her back and radiates down her right and left buttocks. It more frequently happens down her right leg. Certain movements trigger the pain. She has some tingling in the back of her right leg. She has a history of sciatica. She has chronic pain, fibromyalgia, and arthritis. She has been taking meloxicam for pain. Patient has not taken any narcotics for 1.5 years. She does not have any diabetes. She denies fever, SOB, CP, nausea, vomiting, abdominal pain, numbness, bowel or bladder dysfunction, dysuria, urgency frequency.   Past Medical History:  Diagnosis Date  . Arthritis    back  . Depression   . Emphysema lung (HCC)   . Fibromyalgia   . Hyperlipidemia   . Lumbago   . Tobacco abuse   . Vitamin D deficiency disease     Patient Active Problem List   Diagnosis Date Noted  . Medication monitoring encounter 02/09/2016  . Preventative health care 02/09/2016  . Colon cancer screening 02/09/2016  . Need for hepatitis C screening test 09/03/2015  . Essential hypertension, benign 05/17/2015  . Acute allergic reaction 05/17/2015  . Situational stress 05/17/2015  . Obesity 03/03/2015  . Abnormal thyroid blood test 03/03/2015  . Tobacco abuse 03/03/2015  . Hyperlipidemia   . Lumbago   . Fibromyalgia   . Vitamin D deficiency disease     Past Surgical History:  Procedure Laterality Date  . ABDOMINAL HYSTERECTOMY  2000   partial, still has cervix  . CESAREAN SECTION  1996  . LAPAROSCOPIC ABDOMINAL EXPLORATION  1999  . TONSILLECTOMY AND ADENOIDECTOMY  1962    Prior to  Admission medications   Medication Sig Start Date End Date Taking? Authorizing Provider  albuterol (PROVENTIL HFA) 108 (90 Base) MCG/ACT inhaler Inhale 2 puffs into the lungs every 4 (four) hours as needed for wheezing or shortness of breath. 09/03/15   Kerman Passey, MD  atorvastatin (LIPITOR) 40 MG tablet Take 1 tablet (40 mg total) by mouth at bedtime. 02/10/16   Kerman Passey, MD  chlorpheniramine-HYDROcodone (TUSSIONEX PENNKINETIC ER) 10-8 MG/5ML SUER Take 5 mLs by mouth 2 (two) times daily. 06/28/16   Irean Hong, MD  cyclobenzaprine (FLEXERIL) 5 MG tablet Take 1 tablet (5 mg total) by mouth 3 (three) times daily as needed for muscle spasms. 02/15/17 02/22/17  Enid Derry, PA-C  DULoxetine (CYMBALTA) 60 MG capsule TAKE 2 CAPSULES (120 MG TOTAL) BY MOUTH DAILY. 07/24/16   Kerman Passey, MD  meloxicam (MOBIC) 7.5 MG tablet Take 1 tablet (7.5 mg total) by mouth daily. 07/04/16 07/04/17  Orvil Feil, PA-C  naproxen (NAPROSYN) 250 MG tablet Take 220 mg by mouth 2 (two) times daily with a meal.    [provider]  predniSONE (STERAPRED UNI-PAK 21 TAB) 10 MG (21) TBPK tablet Take 6 tablets on day 1, take 5 tablets on day 2, take 4 tablets on day 3, take 3 tablets on day 4, take 2 tablets on day 5, take 1 tablet on day 6 02/15/17   Enid Derry, PA-C    Allergies Patient has no known allergies.  Family  History  Problem Relation Age of Onset  . Cancer Mother        colon  . Osteoporosis Mother   . Hyperlipidemia Mother   . Alzheimer's disease Father   . Thyroid disease Son   . Heart disease Brother     Social History Social History  Substance Use Topics  . Smoking status: Current Every Day Smoker    Packs/day: 1.00    Years: 15.00    Types: Cigarettes  . Smokeless tobacco: Never Used  . Alcohol use No     Review of Systems  Constitutional: No fever/chills Cardiovascular: No chest pain. Respiratory: No SOB. Gastrointestinal: No abdominal pain.  No nausea, no  vomiting.  Musculoskeletal:Positive for back pain Skin: Negative for rash, abrasions, lacerations, ecchymosis. Neurological: Negative for headaches, numbness   ____________________________________________   PHYSICAL EXAM:  VITAL SIGNS: ED Triage Vitals [02/15/17 1222]  Enc Vitals Group     BP (!) 160/100     Pulse Rate 90     Resp 18     Temp      Temp src      SpO2 98 %     Weight 200 lb (90.7 kg)     Height 5\' 4"  (1.626 m)     Head Circumference      Peak Flow      Pain Score 10     Pain Loc      Pain Edu?      Excl. in GC?      Constitutional: Alert and oriented. Well appearing and in no acute distress. Eyes: Conjunctivae are normal. PERRL. EOMI. Head: Atraumatic. ENT:      Ears:      Nose: No congestion/rhinnorhea.      Mouth/Throat: Mucous membranes are moist.  Neck: No stridor.   Cardiovascular: Normal rate, regular rhythm.  Good peripheral circulation. Respiratory: Normal respiratory effort without tachypnea or retractions. Lungs CTAB. Good air entry to the bases with no decreased or absent breath sounds. Gastrointestinal: Bowel sounds 4 quadrants. Soft and nontender to palpation. No guarding or rigidity. No palpable masses. No distention. No CVA tenderness. Musculoskeletal: Full range of motion to all extremities. No gross deformities appreciated. No tenderness to palpation over lumbar spine. No tenderness palpation over hips. Positive straight leg raise. Neurologic:  Normal speech and language. No gross focal neurologic deficits are appreciated.  Skin:  Skin is warm, dry and intact. No rash noted. Psychiatric: Mood and affect are normal. Speech and behavior are normal. Patient exhibits appropriate insight and judgement.   ____________________________________________   LABS (all labs ordered are listed, but only abnormal results are displayed)  Labs Reviewed - No data to  display ____________________________________________  EKG   ____________________________________________  RADIOLOGY  No results found.  ____________________________________________    PROCEDURES  Procedure(s) performed:    Procedures    Medications  oxyCODONE-acetaminophen (PERCOCET/ROXICET) 5-325 MG per tablet 1 tablet (1 tablet Oral Given 02/15/17 1427)  methylPREDNISolone sodium succinate (SOLU-MEDROL) 125 mg/2 mL injection 125 mg (125 mg Intramuscular Given 02/15/17 1526)  ketorolac (TORADOL) 30 MG/ML injection 30 mg (30 mg Intravenous Given 02/15/17 1526)     ____________________________________________   INITIAL IMPRESSION / ASSESSMENT AND PLAN / ED COURSE  Pertinent labs & imaging results that were available during my care of the patient were reviewed by me and considered in my medical decision making (see chart for details).  Review of the [place] CSRS was performed in accordance of the [facility] prior to dispensing any controlled  drugs.     Patient's diagnosis is consistent with sciatica. Vital signs and exam are reassuring. Symptoms have been present for 2 days and patient is agreeable to hold off on imaging at this time. She denies any bowel or bladder dysfunction or saddle paresthesias. She was given a Solu-Medrol injection, Toradol injection and Percocet in ED. Patient will be discharged home with prescriptions for prednisone and Flexeril. Patient is to follow up with PCP as directed. Patient is given ED precautions to return to the ED for any worsening or new symptoms.     ____________________________________________  FINAL CLINICAL IMPRESSION(S) / ED DIAGNOSES  Final diagnoses:  Bilateral sciatica      NEW MEDICATIONS STARTED DURING THIS VISIT:  Discharge Medication List as of 02/15/2017  3:23 PM    START taking these medications   Details  cyclobenzaprine (FLEXERIL) 5 MG tablet Take 1 tablet (5 mg total) by mouth 3 (three) times daily as needed for  muscle spasms., Starting Thu 02/15/2017, Until Thu 02/22/2017, Print            This chart was dictated using voice recognition software/Dragon. Despite best efforts to proofread, errors can occur which can change the meaning. Any change was purely unintentional.    Enid DerryWagner, Yuki Brunsman, PA-C 02/16/17 0727    Nita SickleVeronese, [place], MD 02/16/17 [phone]

## 2017-04-04 ENCOUNTER — Telehealth: Payer: Self-pay | Admitting: Family Medicine

## 2017-04-04 NOTE — Telephone Encounter (Signed)
PT HAS CALLED AND SAID THAT SHE IS HAVING SCIATIC ISSUE AND IS IN PAIN BUT DOES NOT HAVE ANY INSURANCE. I TOLD HER THAT WE COULD GIVE HER AN APPT AND SHE JUST PAY 50.00 UPFRONT THEN BE BILLED FOR THE REST AND SHE SAID THAT SHE HAS NO MONEY AT ALL. I GAVE HER THE [facility] PHONE NUMBER TO CALL TO SEE IF THEY COULD HELP HER AND IF NOT CALL US BACK.

## 2017-04-19 ENCOUNTER — Encounter: Payer: Self-pay | Admitting: Emergency Medicine

## 2017-04-19 ENCOUNTER — Emergency Department
Admission: EM | Admit: 2017-04-19 | Discharge: 2017-04-19 | Disposition: A | Discharge status: Home / Self Care | Payer: Medicare Other | Attending: Emergency Medicine | Admitting: Emergency Medicine

## 2017-04-19 ENCOUNTER — Telehealth: Payer: Self-pay

## 2017-04-19 DIAGNOSIS — G8929 Other chronic pain: Secondary | ICD-10-CM | POA: Insufficient documentation

## 2017-04-19 DIAGNOSIS — J439 Emphysema, unspecified: Secondary | ICD-10-CM | POA: Insufficient documentation

## 2017-04-19 DIAGNOSIS — Z79899 Other long term (current) drug therapy: Secondary | ICD-10-CM | POA: Insufficient documentation

## 2017-04-19 DIAGNOSIS — F17210 Nicotine dependence, cigarettes, uncomplicated: Secondary | ICD-10-CM | POA: Insufficient documentation

## 2017-04-19 DIAGNOSIS — I10 Essential (primary) hypertension: Secondary | ICD-10-CM | POA: Insufficient documentation

## 2017-04-19 DIAGNOSIS — M5441 Lumbago with sciatica, right side: Secondary | ICD-10-CM | POA: Insufficient documentation

## 2017-04-19 MED ORDER — DEXAMETHASONE SODIUM PHOSPHATE 10 MG/ML IJ SOLN
10.0000 mg | Freq: Once | INTRAMUSCULAR | Status: AC
  Administered 2017-04-19: 10 mg via INTRAMUSCULAR
  Filled 2017-04-19: qty 1

## 2017-04-19 MED ORDER — CYCLOBENZAPRINE HCL 5 MG PO TABS: 5.0000 mg | ORAL_TABLET | Freq: Three times a day (TID) | ORAL | 0 refills | Status: DC | PRN

## 2017-04-19 MED ORDER — PREDNISONE 10 MG (48) PO TBPK: ORAL_TABLET | ORAL | 0 refills | Status: DC

## 2017-04-19 MED ORDER — LIDOCAINE 5 % EX PTCH: 1.0000 | MEDICATED_PATCH | Freq: Two times a day (BID) | CUTANEOUS | 0 refills | Status: DC

## 2017-04-19 NOTE — Discharge Instructions (Signed)
Begin taking medication as directed. The steroid pack is for 12 days. Take as directed. Lidocaine patch as directed. And Flexeril one every 8 hours as needed for muscle spasms. Urine needed to call and make an appointment with your primary care doctor for any additional treatment or possible referral for your sciatica.

## 2017-04-19 NOTE — ED Provider Notes (Signed)
[facility] Emergency Department Provider Note   ____________________________________________   First MD Initiated Contact with Patient 04/19/17 1310     (approximate)  I have reviewed the triage vital signs and the nursing notes.   HISTORY  Chief Complaint Sciatica   HPI Jenna Meyers is a 61 y.o. female is here with complaint of low back pain with radiation down her right leg. Patient states that she has had problems with sciatica for over 4 months. She was seen in the emergency room and June and has some improvement of her symptoms. She states that the last 3 days her symptoms have worsened. She has not followed up with her PCP said she was seen in the emergency room in June. In the past she was seen at a pain clinic in which she states they did not agree on her treatment and she was dismissed. Patient denies any saddle anesthesias or incontinence of bowel or bowel.  She rates her pain as an 8 out of 10.   Past Medical History:  Diagnosis Date  . Arthritis    back  . Depression   . Emphysema lung (HCC)   . Fibromyalgia   . Hyperlipidemia   . Lumbago   . Tobacco abuse   . Vitamin D deficiency disease     Patient Active Problem List   Diagnosis Date Noted  . Medication monitoring encounter 02/09/2016  . Preventative health care 02/09/2016  . Colon cancer screening 02/09/2016  . Need for hepatitis C screening test 09/03/2015  . Essential hypertension, benign 05/17/2015  . Acute allergic reaction 05/17/2015  . Situational stress 05/17/2015  . Obesity 03/03/2015  . Abnormal thyroid blood test 03/03/2015  . Tobacco abuse 03/03/2015  . Hyperlipidemia   . Lumbago   . Fibromyalgia   . Vitamin D deficiency disease     Past Surgical History:  Procedure Laterality Date  . ABDOMINAL HYSTERECTOMY  2000   partial, still has cervix  . CESAREAN SECTION  1996  . LAPAROSCOPIC ABDOMINAL EXPLORATION  1999  . TONSILLECTOMY AND ADENOIDECTOMY  1962     Prior to Admission medications   Medication Sig Start Date End Date Taking? Authorizing Provider  albuterol (PROVENTIL HFA) 108 (90 Base) MCG/ACT inhaler Inhale 2 puffs into the lungs every 4 (four) hours as needed for wheezing or shortness of breath. 09/03/15   Kerman Passey, MD  atorvastatin (LIPITOR) 40 MG tablet Take 1 tablet (40 mg total) by mouth at bedtime. 02/10/16   Kerman Passey, MD  cyclobenzaprine (FLEXERIL) 5 MG tablet Take 1 tablet (5 mg total) by mouth 3 (three) times daily as needed for muscle spasms. 04/19/17   Tommi Rumps, PA-C  DULoxetine (CYMBALTA) 60 MG capsule TAKE 2 CAPSULES (120 MG TOTAL) BY MOUTH DAILY. 07/24/16   Lada, Janit Bern, MD  lidocaine (LIDODERM) 5 % Place 1 patch onto the skin every 12 (twelve) hours. Remove & Discard patch within 12 hours or as directed by MD 04/19/17 04/19/18  Bridget Hartshorn L, PA-C  naproxen (NAPROSYN) 250 MG tablet Take 220 mg by mouth 2 (two) times daily with a meal.    [provider]  predniSONE (STERAPRED UNI-PAK 48 TAB) 10 MG (48) TBPK tablet Take 6 tablets for 2 days, take 5 tablets for 2 days, take 4 tablets for 2 days, take 3 tablets for 2 days, take 1 tablet for 2 days. 04/19/17   Tommi Rumps, PA-C    Allergies Patient has no known allergies.  Family History  Problem Relation Age of Onset  . Cancer Mother        colon  . Osteoporosis Mother   . Hyperlipidemia Mother   . Alzheimer's disease Father   . Thyroid disease Son   . Heart disease Brother     Social History Social History  Substance Use Topics  . Smoking status: Current Every Day Smoker    Packs/day: 1.00    Years: 15.00    Types: Cigarettes  . Smokeless tobacco: Never Used  . Alcohol use No    Review of Systems Constitutional: No fever/chills Cardiovascular: Denies chest pain. Respiratory: Denies shortness of breath. Musculoskeletal: Positive chronic back pain. Positive chronic sciatica. Neurological: Negative for headaches,  focal weakness. ____________________________________________   PHYSICAL EXAM:  VITAL SIGNS: ED Triage Vitals [04/19/17 1149]  Enc Vitals Group     BP 136/84     Pulse Rate (!) 104     Resp 20     Temp 98.3 F (36.8 C)     Temp Source Oral     SpO2 98 %     Weight 210 lb (95.3 kg)     Height 5\' 4"  (1.626 m)     Head Circumference      Peak Flow      Pain Score 8     Pain Loc      Pain Edu?      Excl. in GC?     Constitutional: Alert and oriented. Well appearing and in no acute distress. Eyes: Conjunctivae are normal. PERRL. EOMI. Head: Atraumatic. Neck: No stridor.   Cardiovascular: Normal rate, regular rhythm. Grossly normal heart sounds.  Good peripheral circulation. Respiratory: Normal respiratory effort.  No retractions. Lungs CTAB. Musculoskeletal: There is no point tenderness on palpation of the vertebral bodies lumbar spine. There is some minimal tenderness on palpation of the left SI joint area however there is marked tenderness on palpation of the right SI joint area. No gross deformity is noted. Patient has slow range of motion but is able to move both extremities without assistance. Good muscle strength bilaterally. Straight leg raises were 90 with minimal discomfort on the right leg.  Neurologic:  Normal speech and language. No gross focal neurologic deficits are appreciated.  Skin:  Skin is warm, dry and intact. No rash noted. Psychiatric: Mood and affect are normal. Speech and behavior are normal.  ____________________________________________   LABS (all labs ordered are listed, but only abnormal results are displayed)  Labs Reviewed - No data to display  RADIOLOGY  Deferred ____________________________________________   PROCEDURES  Procedure(s) performed: None  Procedures  Critical Care performed: No  ____________________________________________   INITIAL IMPRESSION / ASSESSMENT AND PLAN / ED COURSE  Pertinent labs & imaging results that  were available during my care of the patient were reviewed by me and considered in my medical decision making (see chart for details).  Patient was chronic back pain with right-sided sciatica for greater than 4 months. Patient was given Decadron 10 mg IM in the ED. Patient was discharged on Flexeril 1 3 times a day as needed for muscle spasms. Lidoderm patch 1 every 12 hours #6. Prednisone 12 day pack. Patient is encouraged to follow up with her PCP Dr. Sherie DonLada.  She is to call tomorrow and make an appointment for  to be seen for her back and further imaging such as an MRI outpatient for referral to a neurosurgeon if indicated.   ___________________________________________   FINAL CLINICAL IMPRESSION(S) / ED  DIAGNOSES  Final diagnoses:  Chronic right-sided low back pain with right-sided sciatica      NEW MEDICATIONS STARTED DURING THIS VISIT:  Discharge Medication List as of 04/19/2017  3:27 PM    START taking these medications   Details  cyclobenzaprine (FLEXERIL) 5 MG tablet Take 1 tablet (5 mg total) by mouth 3 (three) times daily as needed for muscle spasms., Starting Thu 04/19/2017, Print    lidocaine (LIDODERM) 5 % Place 1 patch onto the skin every 12 (twelve) hours. Remove & Discard patch within 12 hours or as directed by MD, Starting Thu 04/19/2017, Until Fri 04/19/2018, Print         Note:  This document was prepared using Dragon voice recognition software and may include unintentional dictation errors.    Tommi Rumps, PA-C 04/19/17 1659    Arnaldo Natal, MD 04/20/17 [phone]

## 2017-04-19 NOTE — Telephone Encounter (Signed)
Patient called back about having severe pain in back shooting down leg.  Has went to the ER back in June and was dx with sciatica.  She is still not feeling any better.  Does not know what to do has [facility], but cant get in until 2 weeks.  She has no ins. And has not been seen her in over a year.  I told her if she was having that much pain still and could barely walk and did not have the money to come see us, she would need to be evaluated again at the ER.

## 2017-04-19 NOTE — ED Notes (Signed)
See triage note  States she was seen for sciatica couple of months ago  States pain is better on the left but conts on the right side

## 2017-04-19 NOTE — ED Triage Notes (Signed)
Pt reports history of right side sciatica since June. Pt states the pain has increased. Pt reports pain from right hip down leg and a sensation of tingling to leg. Pt appears uncomfortable in triage.

## 2017-05-18 ENCOUNTER — Ambulatory Visit: Payer: Medicare Other | Admitting: Family Medicine

## 2017-05-30 ENCOUNTER — Telehealth: Payer: Self-pay | Admitting: Family Medicine

## 2017-05-30 ENCOUNTER — Encounter: Payer: Self-pay | Admitting: Intensive Care

## 2017-05-30 ENCOUNTER — Ambulatory Visit (INDEPENDENT_AMBULATORY_CARE_PROVIDER_SITE_OTHER): Payer: BC Managed Care – PPO | Admitting: Family Medicine

## 2017-05-30 ENCOUNTER — Emergency Department
Admission: EM | Admit: 2017-05-30 | Discharge: 2017-05-30 | Disposition: A | Discharge status: Home / Self Care | Payer: Medicare Other | Attending: Emergency Medicine | Admitting: Emergency Medicine

## 2017-05-30 ENCOUNTER — Emergency Department: Payer: Medicare Other

## 2017-05-30 ENCOUNTER — Encounter: Payer: Self-pay | Admitting: Family Medicine

## 2017-05-30 VITALS — BP 146/88 | HR 88 | Temp 98.4°F | Resp 14 | Ht 64.0 in | Wt 216.7 lb

## 2017-05-30 DIAGNOSIS — M797 Fibromyalgia: Secondary | ICD-10-CM

## 2017-05-30 DIAGNOSIS — J439 Emphysema, unspecified: Secondary | ICD-10-CM | POA: Diagnosis not present

## 2017-05-30 DIAGNOSIS — Z720 Tobacco use: Secondary | ICD-10-CM

## 2017-05-30 DIAGNOSIS — F17210 Nicotine dependence, cigarettes, uncomplicated: Secondary | ICD-10-CM | POA: Insufficient documentation

## 2017-05-30 DIAGNOSIS — R0789 Other chest pain: Secondary | ICD-10-CM | POA: Insufficient documentation

## 2017-05-30 DIAGNOSIS — Z1239 Encounter for other screening for malignant neoplasm of breast: Secondary | ICD-10-CM

## 2017-05-30 DIAGNOSIS — R079 Chest pain, unspecified: Secondary | ICD-10-CM | POA: Diagnosis not present

## 2017-05-30 DIAGNOSIS — Z23 Encounter for immunization: Secondary | ICD-10-CM | POA: Diagnosis not present

## 2017-05-30 DIAGNOSIS — Z1231 Encounter for screening mammogram for malignant neoplasm of breast: Secondary | ICD-10-CM | POA: Diagnosis not present

## 2017-05-30 DIAGNOSIS — Z7982 Long term (current) use of aspirin: Secondary | ICD-10-CM | POA: Insufficient documentation

## 2017-05-30 DIAGNOSIS — I10 Essential (primary) hypertension: Secondary | ICD-10-CM | POA: Insufficient documentation

## 2017-05-30 DIAGNOSIS — E785 Hyperlipidemia, unspecified: Secondary | ICD-10-CM | POA: Insufficient documentation

## 2017-05-30 DIAGNOSIS — Z1211 Encounter for screening for malignant neoplasm of colon: Secondary | ICD-10-CM | POA: Diagnosis not present

## 2017-05-30 DIAGNOSIS — Z79899 Other long term (current) drug therapy: Secondary | ICD-10-CM | POA: Insufficient documentation

## 2017-05-30 DIAGNOSIS — R002 Palpitations: Secondary | ICD-10-CM | POA: Diagnosis not present

## 2017-05-30 LAB — CBC WITH DIFFERENTIAL/PLATELET
BASOS PCT: 1 %
Basophils Absolute: 0.1 10*3/uL (ref 0–0.1)
Eosinophils Absolute: 0.1 10*3/uL (ref 0–0.7)
Eosinophils Relative: 1 %
HEMATOCRIT: 40.1 % (ref 35.0–47.0)
HEMOGLOBIN: 13.6 g/dL (ref 12.0–16.0)
LYMPHS ABS: 1.7 10*3/uL (ref 1.0–3.6)
LYMPHS PCT: 17 %
MCH: 31 pg (ref 26.0–34.0)
MCHC: 33.9 g/dL (ref 32.0–36.0)
MCV: 91.5 fL (ref 80.0–100.0)
MONOS PCT: 7 %
Monocytes Absolute: 0.7 10*3/uL (ref 0.2–0.9)
NEUTROS ABS: 7.6 10*3/uL — AB (ref 1.4–6.5)
NEUTROS PCT: 74 %
Platelets: 293 10*3/uL (ref 150–440)
RBC: 4.38 MIL/uL (ref 3.80–5.20)
RDW: 13.5 % (ref 11.5–14.5)
WBC: 10.2 10*3/uL (ref 3.6–11.0)

## 2017-05-30 LAB — TROPONIN I
Troponin I: <0.03 ng/mL (ref <0.03)
Troponin I: <0.03 ng/mL (ref <0.03)

## 2017-05-30 LAB — BASIC METABOLIC PANEL
Anion gap: 10 (ref 5–15)
BUN: 15 mg/dL (ref 6–20)
CHLORIDE: 109 mmol/L (ref 101–111)
CO2: 22 mmol/L (ref 22–32)
CREATININE: 0.92 mg/dL (ref 0.44–1.00)
Calcium: 9.3 mg/dL (ref 8.9–10.3)
GFR calc non Af Amer: >60 mL/min (ref >60)
GLUCOSE: 88 mg/dL (ref 65–99)
Potassium: 4 mmol/L (ref 3.5–5.1)
Sodium: 141 mmol/L (ref 135–145)

## 2017-05-30 LAB — TSH: TSH: 3.968 u[IU]/mL (ref 0.350–4.500)

## 2017-05-30 MED ORDER — VARENICLINE TARTRATE 0.5 MG X 11 & 1 MG X 42 PO MISC: ORAL | 0 refills | Status: DC

## 2017-05-30 MED ORDER — ASPIRIN 81 MG PO CHEW
324.0000 mg | CHEWABLE_TABLET | Freq: Once | ORAL | Status: AC
  Administered 2017-05-30: 324 mg via ORAL

## 2017-05-30 MED ORDER — AMLODIPINE BESYLATE 5 MG PO TABS: 5.0000 mg | ORAL_TABLET | Freq: Every day | ORAL | 3 refills | Status: DC

## 2017-05-30 MED ORDER — NITROGLYCERIN 0.4 MG SL SUBL
0.4000 mg | SUBLINGUAL_TABLET | Freq: Once | SUBLINGUAL | Status: AC
  Administered 2017-05-30: 0.4 mg via SUBLINGUAL

## 2017-05-30 MED ORDER — DULOXETINE HCL 30 MG PO CPEP: ORAL_CAPSULE | ORAL | 0 refills | Status: DC

## 2017-05-30 MED ORDER — ASPIRIN 81 MG PO CHEW: 81.0000 mg | CHEWABLE_TABLET | Freq: Once | ORAL | Status: DC

## 2017-05-30 MED ORDER — ALBUTEROL SULFATE HFA 108 (90 BASE) MCG/ACT IN AERS: 2.0000 | INHALATION_SPRAY | RESPIRATORY_TRACT | 1 refills | Status: DC | PRN

## 2017-05-30 MED ORDER — CYCLOBENZAPRINE HCL 5 MG PO TABS: 5.0000 mg | ORAL_TABLET | Freq: Three times a day (TID) | ORAL | 0 refills | Status: DC | PRN

## 2017-05-30 MED ORDER — VARENICLINE TARTRATE 1 MG PO TABS: 1.0000 mg | ORAL_TABLET | Freq: Two times a day (BID) | ORAL | 3 refills | Status: DC

## 2017-05-30 MED ORDER — METOPROLOL SUCCINATE ER 25 MG PO TB24: 25.0000 mg | ORAL_TABLET | Freq: Every day | ORAL | 5 refills | Status: DC

## 2017-05-30 NOTE — Progress Notes (Signed)
BP (!) 146/88 (BP Location: Left Arm, Cuff Size: Normal)   Pulse 88   Temp 98.4 F (36.9 C) (Oral)   Resp 14   Ht _0  (1.626 m)   Wt 216 lb 11.2 oz (98.3 kg)   SpO2 96%   BMI 37.20 kg/m    Today's Vitals   05/30/17 0953 05/30/17 1008  BP: (!) 156/84 (!) 146/88  Pulse: (!) 115 88  Resp: 14   Temp: 98.4 F (36.9 C)   TempSrc: Oral   SpO2: 96%   Weight: 216 lb 11.2 oz (98.3 kg)   Height: _1  (1.626 m)   PainSc: 4      Subjective:    Patient ID: Jenna Meyers, female    DOB: [birth year], 61 y.o.   MRN: [id number]  HPI: Jenna Meyers is a 61 y.o. female  Chief Complaint  Patient presents with  . Sciatica    Right leg  . Medication Refill    FLexril, inhaler and cymbolta    HPI She is here for an acute visit  She would like to try Chantix again; she has emphysema, COPD; has had to been to the ER She has a 57.104 year old grandson; just met him in April for the first time; he lives with her now, light of her life First cigarette of the morning is right away; last cigarette is right before bed, not smoking in bed; coffee is a trigger, everything is a trigger; she just smokes; started at age 46, then quit for 15 years (that was 20 years ago) She took Chantix 3-4 years ago; took for a month and quit for 2 months; hoping this time she has the incentive and it will be less expensive  She has emphysema and COPD; needs refills of her inhalers; just wants the rescue inhaler for when she gets a cold; day to day has no problems  She has been off of cymbalta for a while; helps with back pain and mood; has been off completely, would like to get back on  Sciatica; was bilateral and went to the ER; was treated with prednisone and muscle relaxers; the right side has not gone away ever; last MRI was 15-16 years ago; she asked for muscle relaxer, has pain with movement; butt and hip and down to toes; muscle relaxant actually helps that; does not make her groggy or sleepy, does not  affect her at all; using naproxen occasionally  Lipitor is on the medicine list; last prscription was written in June of 2017; I asked if she is still taking this; she stopped it taking it for a while and has started back again; her diet has been better too; she is cooking more; was eating at [employer]; back on lipitor now about 6 months or so  Blood pressure high today; pain makes it go up; she does not add salt to her food  Depression screen [facility] 2/9 02/09/2016  Decreased Interest 0  Down, Depressed, Hopeless 0  PHQ - 2 Score 0    Relevant past medical, surgical, family and social history reviewed Past Medical History:  Diagnosis Date  . Arthritis    back  . Depression   . Emphysema lung ([place])   . Fibromyalgia   . Hyperlipidemia   . Lumbago   . Tobacco abuse   . Vitamin D deficiency disease    Past Surgical History:  Procedure Laterality Date  . ABDOMINAL HYSTERECTOMY  2000   partial, still has cervix  .  CESAREAN SECTION  1996  . LAPAROSCOPIC ABDOMINAL EXPLORATION  1999  . TONSILLECTOMY AND ADENOIDECTOMY  1962   Family History  Problem Relation Age of Onset  . Cancer Mother        colon  . Osteoporosis Mother   . Hyperlipidemia Mother   . Alzheimer's disease Father   . Thyroid disease Son   . Heart disease Brother    Social History   Social History  . Marital status: Single    Spouse name: N/A  . Number of children: N/A  . Years of education: N/A   Occupational History  . Not on file.   Social History Main Topics  . Smoking status: Current Every Day Smoker    Packs/day: 1.00    Years: 15.00    Types: Cigarettes  . Smokeless tobacco: Never Used  . Alcohol use No  . Drug use: No  . Sexual activity: No   Other Topics Concern  . Not on file   Social History Narrative  . No narrative on file   Interim medical history since last visit reviewed. Allergies and medications reviewed  Review of Systems Per HPI unless specifically indicated  above     Objective:    BP (!) 146/88 (BP Location: Left Arm, Cuff Size: Normal)   Pulse 88   Temp 98.4 F (36.9 C) (Oral)   Resp 14   Ht _0  (1.626 m)   Wt 216 lb 11.2 oz (98.3 kg)   SpO2 96%   BMI 37.20 kg/m   Wt Readings from Last 3 Encounters:  05/30/17 216 lb (98 kg)  05/30/17 216 lb 11.2 oz (98.3 kg)  04/19/17 210 lb (95.3 kg)  Patient reported feeling dizzy, light-headed as we were nearing the end of our visit; vitals rechecked Then complained of chest tightness Pulse 88, oxygen 96% on room air 148/98 BP FSBS 89  Physical Exam  Constitutional: She appears well-developed and well-nourished.  Cardiovascular: Regular rhythm.   Extrasystoles are present. Tachycardia present.   Pulmonary/Chest: Effort normal and breath sounds normal. She has no decreased breath sounds. She has no wheezes. She has no rhonchi.  Abdominal: She exhibits no distension.  Neurological: She is alert.  Skin: She is diaphoretic (mildly diaphoretic after chest tightness started). No pallor.  Psychiatric: Her mood appears anxious (after chest tightness started; appeared relaxed and calm during first part of the visit). She does not exhibit a depressed mood.      Assessment & Plan:   Problem List Items Addressed This Visit      Cardiovascular and Mediastinum   Essential hypertension, benign    Will start medicine, discussed risk of stroke, DASH guidelines, smoking cessation      Relevant Medications   aspirin EC 81 MG tablet   metoprolol succinate (TOPROL-XL) 25 MG 24 hr tablet   nitroGLYCERIN (NITROSTAT) SL tablet 0.4 mg (Completed)   aspirin chewable tablet 324 mg (Completed)     Respiratory   Emphysema of lung ([place])    Encouraged smoking cessation      Relevant Medications   albuterol (PROVENTIL HFA) 108 (90 Base) MCG/ACT inhaler     Other   Palpitations    Check EKG; no chest pain but reported chest tightness; check labs and offered referral to ER      Relevant Orders   EKG  12-Lead (Completed)   Chest tightness or pressure - Primary    Called 911, EKG obtained      Relevant Medications  nitroGLYCERIN (NITROSTAT) SL tablet 0.4 mg (Completed)   aspirin chewable tablet 324 mg (Completed)   Other Relevant Orders   EKG 12-Lead (Completed)   Tobacco abuse    Discussed different forms of smoking cessation with the patient, and I encouraged cessation. Patient requests trial of Chantix.  I discussed common side effects including but not limited to nausea/vomiting, vivid dreams, behaviour changes, depression, suicidal ideation; also explained possible increased cardiovascular risk. After discussion, patient opts to try quitting with Chantix. The 1-800-QUIT-NOW number given in patient instructions. Patient was encouraged to choose a quit date about 1 week after starting medicine. See AVS --> AFTER our discussion, near the end of the expected visit time, she developed chest tightness and mild diaphoresis; EMS was called; I am VOIDING the Rx for Chantix until she is seen in the ER and has her heart checked out; she never started this Rx today; it was not sent to the pharmacy      Fibromyalgia    Start back on cymbalta; taper up gradually       Other Visit Diagnoses    Needs flu shot       Screening for breast cancer       Relevant Orders   MM Digital Screening   Screening for colon cancer         Follow up plan: No Follow-up on file.  An after-visit summary was printed and given to the patient at [place].  Please see the patient instructions which may contain other information and recommendations beyond what is mentioned above in the assessment and plan.  Meds ordered this encounter  Medications  . Turmeric Curcumin 500 MG CAPS    Sig: Take 500 mg by mouth 2 (two) times daily.   Marland Kitchen DISCONTD: varenicline (CHANTIX STARTING MONTH PAK) 0.5 MG X 11 & 1 MG X 42 tablet    Sig: Take one 0.5 mg tablet by mouth once daily for 3 days, then increase to one 0.5 mg tablet  twice daily for 4 days, then increase to one 1 mg tablet twice daily.    Dispense:  53 tablet    Refill:  0    Fill first  . DISCONTD: varenicline (CHANTIX CONTINUING MONTH PAK) 1 MG tablet    Sig: Take 1 tablet (1 mg total) by mouth 2 (two) times daily. Fill this 2nd after finishing the starter pack    Dispense:  60 tablet    Refill:  3    Fill AFTER completion of starter pack  . DULoxetine (CYMBALTA) 30 MG capsule    Sig: One by mouth daily x 10 days, then two a day    Dispense:  50 capsule    Refill:  0  . cyclobenzaprine (FLEXERIL) 5 MG tablet    Sig: Take 1 tablet (5 mg total) by mouth 3 (three) times daily as needed for muscle spasms.    Dispense:  15 tablet    Refill:  0  . albuterol (PROVENTIL HFA) 108 (90 Base) MCG/ACT inhaler    Sig: Inhale 2 puffs into the lungs every 4 (four) hours as needed for wheezing or shortness of breath.    Dispense:  1 Inhaler    Refill:  1  . aspirin EC 81 MG tablet    Sig: Take 1 tablet (81 mg total) by mouth daily. Take at least one hour before naproxen  . DISCONTD: amLODipine (NORVASC) 5 MG tablet    Sig: Take 1 tablet (5 mg total) by mouth  daily.    Dispense:  30 tablet    Refill:  3  . metoprolol succinate (TOPROL-XL) 25 MG 24 hr tablet    Sig: Take 1 tablet (25 mg total) by mouth daily. For blood pressure    Dispense:  30 tablet    Refill:  5    Disregard the Rx for amlodipine  . DISCONTD: aspirin chewable tablet 81 mg  . nitroGLYCERIN (NITROSTAT) SL tablet 0.4 mg  . aspirin chewable tablet 324 mg    Orders Placed This Encounter  Procedures  . MM Digital Screening  . EKG 12-Lead    911, EMS called at 10:37 am EKG obtained 4 chewable baby aspirin given at 10:40 am Oxygen started 2 l/m at 10:41 am Face-to-face time with patient was more than 40 minutes, >50% time spent counseling and coordination of care; significant concern regarding chest tightness, attended to her during evaluation, through arrival of EMS and until she was  transported to ER

## 2017-05-30 NOTE — Patient Instructions (Addendum)
I do encourage you to quit smoking Call [phone] to sign up for smoking cessation classes You can call 1-800-QUIT-NOW to talk with a smoking cessation coach   Steps to Quit Smoking Smoking tobacco can be bad for your health. It can also affect almost every organ in your body. Smoking puts you and people around you at risk for many serious long-lasting (chronic) diseases. Quitting smoking is hard, but it is one of the best things that you can do for your health. It is never too late to quit. What are the benefits of quitting smoking? When you quit smoking, you lower your risk for getting serious diseases and conditions. They can include:  Lung cancer or lung disease.  Heart disease.  Stroke.  Heart attack.  Not being able to have children (infertility).  Weak bones (osteoporosis) and broken bones (fractures).  If you have coughing, wheezing, and shortness of breath, those symptoms may get better when you quit. You may also get sick less often. If you are pregnant, quitting smoking can help to lower your chances of having a baby of low birth weight. What can I do to help me quit smoking? Talk with your doctor about what can help you quit smoking. Some things you can do (strategies) include:  Quitting smoking totally, instead of slowly cutting back how much you smoke over a period of time.  Going to in-person counseling. You are more likely to quit if you go to many counseling sessions.  Using resources and support systems, such as: ? Online chats with a counselor. ? Phone quitlines. ? Printed self-help materials. ? Support groups or group counseling. ? Text messaging programs. ? Mobile phone apps or applications.  Taking medicines. Some of these medicines may have nicotine in them. If you are pregnant or breastfeeding, do not take any medicines to quit smoking unless your doctor says it is okay. Talk with your doctor about counseling or other things that can help you.  Talk  with your doctor about using more than one strategy at the same time, such as taking medicines while you are also going to in-person counseling. This can help make quitting easier. What things can I do to make it easier to quit? Quitting smoking might feel very hard at first, but there is a lot that you can do to make it easier. Take these steps:  Talk to your family and friends. Ask them to support and encourage you.  Call phone quitlines, reach out to support groups, or work with a counselor.  Ask people who smoke to not smoke around you.  Avoid places that make you want (trigger) to smoke, such as: ? Bars. ? Parties. ? Smoke-break areas at work.  Spend time with people who do not smoke.  Lower the stress in your life. Stress can make you want to smoke. Try these things to help your stress: ? Getting regular exercise. ? Deep-breathing exercises. ? Yoga. ? Meditating. ? Doing a body scan. To do this, close your eyes, focus on one area of your body at a time from head to toe, and notice which parts of your body are tense. Try to relax the muscles in those areas.  Download or buy apps on your mobile phone or tablet that can help you stick to your quit plan. There are many free apps, such as QuitGuide from the CDC (Centers for Disease Control and Prevention). You can find more support from smokefree.gov and other websites.  This information is not   intended to replace advice given to you by your health care provider. Make sure you discuss any questions you have with your health care provider. Document Released: 06/03/2009 Document Revised: 04/04/2016 Document Reviewed: 12/22/2014 Elsevier Interactive Patient Education  2018 [employer] Inc.  

## 2017-05-30 NOTE — Telephone Encounter (Signed)
I see patient is being discharge from the ER Please let her know that we'll hold the Chantix for now until she sees cardiology and gets cleared to use this I'll also suggest that she not take naproxen until cleared by cardiologist; NSAIDs have a warning about cardiovascular risks

## 2017-05-30 NOTE — ED Provider Notes (Signed)
[facility] Emergency Department Provider Note  ____________________________________________  Time seen: Approximately 11:56 AM  I have reviewed the triage vital signs and the nursing notes.   HISTORY  Chief Complaint Chest Pain   HPI Jenna Meyers is a 61 y.o. female with a history of smoking, COPD, hyperlipidemia, fibromyalgia, and palpitations who presents for evaluation of chest pain. Patient was at her primary care doctor's office today for a visit for her sciatica pain when she started having chest discomfort. She describes as a mild chest pressure located in the center of her chest, she denies dizziness but reports that she was feeling off, she had mild shortness of breath associated with it and she was clammy, no nausea or vomiting. Patient denies ever having similar pain in the past. She denies family history of heart disease. She has never had a stress test or seen a cardiologist. Patient does endorse intermittent episodes of palpitations that have been rare over the last several years which she attributes to stress and caffeine. Over the last 3 days she reports that these episodes have become more pronounced. She denies any palpitations today. She denies any history of thyroid disease. She does drink several cups a day of caffeine. She denies personal or family history of blood clots, recent travel or immobilization, leg pain or swelling, hemoptysis, or hormones.At her PCPs office patient had an EKG which was reassuring, received a full dose aspirin and 1 sublingual nitroglycerin. Upon arrival to the emergency department patient's pain is fully resolved.  Past Medical History:  Diagnosis Date  . Arthritis    back  . Depression   . Emphysema lung (HCC)   . Fibromyalgia   . Hyperlipidemia   . Lumbago   . Tobacco abuse   . Vitamin D deficiency disease     Patient Active Problem List   Diagnosis Date Noted  . Palpitations 05/30/2017  . Chest  tightness or pressure 05/30/2017  . Medication monitoring encounter 02/09/2016  . Preventative health care 02/09/2016  . Colon cancer screening 02/09/2016  . Need for hepatitis C screening test 09/03/2015  . Essential hypertension, benign 05/17/2015  . Acute allergic reaction 05/17/2015  . Situational stress 05/17/2015  . Obesity 03/03/2015  . Abnormal thyroid blood test 03/03/2015  . Tobacco abuse 03/03/2015  . Hyperlipidemia   . Lumbago   . Fibromyalgia   . Vitamin D deficiency disease     Past Surgical History:  Procedure Laterality Date  . ABDOMINAL HYSTERECTOMY  2000   partial, still has cervix  . CESAREAN SECTION  1996  . LAPAROSCOPIC ABDOMINAL EXPLORATION  1999  . TONSILLECTOMY AND ADENOIDECTOMY  1962    Prior to Admission medications   Medication Sig Start Date End Date Taking? Authorizing Provider  albuterol (PROVENTIL HFA) 108 (90 Base) MCG/ACT inhaler Inhale 2 puffs into the lungs every 4 (four) hours as needed for wheezing or shortness of breath. 05/30/17  Yes Lada, Janit Bern, MD  aspirin EC 81 MG tablet Take 1 tablet (81 mg total) by mouth daily. Take at least one hour before naproxen 05/30/17  Yes Lada, Janit Bern, MD  atorvastatin (LIPITOR) 40 MG tablet Take 1 tablet (40 mg total) by mouth at bedtime. 02/10/16  Yes Lada, Janit Bern, MD  naproxen (NAPROSYN) 250 MG tablet Take 250 mg by mouth 2 (two) times daily.    Yes [provider]  Turmeric Curcumin 500 MG CAPS Take 500 mg by mouth 2 (two) times daily.    Yes  [provider]  cyclobenzaprine (FLEXERIL) 5 MG tablet Take 1 tablet (5 mg total) by mouth 3 (three) times daily as needed for muscle spasms. Patient not taking: Reported on 05/30/2017 05/30/17   Kerman Passey, MD  DULoxetine (CYMBALTA) 30 MG capsule One by mouth daily x 10 days, then two a day Patient not taking: Reported on 05/30/2017 05/30/17   Kerman Passey, MD  metoprolol succinate (TOPROL-XL) 25 MG 24 hr tablet Take 1 tablet (25 mg  total) by mouth daily. For blood pressure Patient not taking: Reported on 05/30/2017 05/30/17   Kerman Passey, MD  varenicline (CHANTIX CONTINUING MONTH PAK) 1 MG tablet Take 1 tablet (1 mg total) by mouth 2 (two) times daily. Fill this 2nd after finishing the starter pack Patient not taking: Reported on 05/30/2017 05/30/17   Kerman Passey, MD  varenicline (CHANTIX STARTING MONTH PAK) 0.5 MG X 11 & 1 MG X 42 tablet Take one 0.5 mg tablet by mouth once daily for 3 days, then increase to one 0.5 mg tablet twice daily for 4 days, then increase to one 1 mg tablet twice daily. Patient not taking: Reported on 05/30/2017 05/30/17   Kerman Passey, MD    Allergies Patient has no known allergies.  Family History  Problem Relation Age of Onset  . Cancer Mother        colon  . Osteoporosis Mother   . Hyperlipidemia Mother   . Alzheimer's disease Father   . Thyroid disease Son   . Heart disease Brother     Social History Social History  Substance Use Topics  . Smoking status: Current Every Day Smoker    Packs/day: 1.00    Years: 15.00    Types: Cigarettes  . Smokeless tobacco: Never Used  . Alcohol use No    Review of Systems  Constitutional: Negative for fever. Eyes: Negative for visual changes. ENT: Negative for sore throat. Neck: No neck pain  Cardiovascular: + chest pain. Respiratory: + shortness of breath. Gastrointestinal: Negative for abdominal pain, vomiting or diarrhea. Genitourinary: Negative for dysuria. Musculoskeletal: Negative for back pain. Skin: Negative for rash. Neurological: Negative for headaches, weakness or numbness. Psych: No SI or HI  ____________________________________________   PHYSICAL EXAM:  VITAL SIGNS: Vitals:   05/30/17 1323 05/30/17 1520  BP:  132/85  Pulse: 94 81  Resp: 19 15  SpO2: 98% 98%    Constitutional: Alert and oriented. Well appearing and in no apparent distress. HEENT:      Head: Normocephalic and atraumatic.          Eyes: Conjunctivae are normal. Sclera is non-icteric.       Mouth/Throat: Mucous membranes are moist.       Neck: Supple with no signs of meningismus. Cardiovascular: Regular rate and rhythm. No murmurs, gallops, or rubs. 2+ symmetrical distal pulses are present in all extremities. No JVD. Respiratory: Normal respiratory effort. Lungs are clear to auscultation bilaterally. No wheezes, crackles, or rhonchi.  Gastrointestinal: Soft, non tender, and non distended with positive bowel sounds. No rebound or guarding. Musculoskeletal: Nontender with normal range of motion in all extremities. No edema, cyanosis, or erythema of extremities. Neurologic: Normal speech and language. Face is symmetric. Moving all extremities. No gross focal neurologic deficits are appreciated. Skin: Skin is warm, dry and intact. No rash noted. Psychiatric: Mood and affect are normal. Speech and behavior are normal.  ____________________________________________   LABS (all labs ordered are listed, but only abnormal results are displayed)  Labs Reviewed  CBC WITH DIFFERENTIAL/PLATELET - Abnormal; Notable for the following:       Result Value   Neutro Abs 7.6 (*)    All other components within normal limits  BASIC METABOLIC PANEL  TROPONIN I  TSH  TROPONIN I   ____________________________________________  EKG  ED ECG REPORT I, Nita Sickle, the attending physician, personally viewed and interpreted this ECG.  Normal sinus rhythm, rate of 82, normal intervals, normal axis, no ST elevations or depressions. Normal EKG. Unchanged from prior from November 2017. ____________________________________________  RADIOLOGY  CXR: Negative ____________________________________________   PROCEDURES  Procedure(s) performed: None Procedures Critical Care performed:  None ____________________________________________   INITIAL IMPRESSION / ASSESSMENT AND PLAN / ED COURSE  Chest pain in a 61 y.o. female with low  suspicion for cardiac (HEART score 3) or other serious etiology (including aortic dissection, pneumonia, pneumothorax, or pulmonary embolism) based her history and physical exam in the ED today. EKG normal. Plan for labs including CBC, chemistries and troponin now and in 3 hours, CXR and re-evaluation for disposition. Patient received full dose ASA PTA. Pain free at this time. Will monitor closely on telemetry. Review of EMR shows no prior visitis to cardiology, no stress/echo/LHC in the past.   ----------------------------------------- 11:47 PM on 05/30/2017 ----------------------------------------- OBSERVATION CARE: This patient is being placed under observation care for the following reasons: Chest pain with repeat testing to rule out ischemia  ----------------------------------------- 2:08 PM on 05/30/2017 -----------------------------------------  Patient remains stable. Pain free. Second troponin pending.  ----------------------------------------- 4:08 PM on 05/30/2017 -----------------------------------------  END OF OBSERVATION STATUS: After an appropriate period of observation, this patient is being discharged due to the following reason(s):  Patient remains pain-free. Troponin 2 negative. All her labs within normal limits. No evidence of arrhythmia and cardiac telemetry. Patient is going to be referred to cardiology Dr. Cristal Deer End for further evaluation. Discussed return precautions.   As part of my medical decision making, I reviewed the following data within the electronic MEDICAL RECORD NUMBER Labs reviewed , EKG interpreted NSR, Radiograph reviewed , Notes from prior ED visits and [place] Controlled Substance Database       Pertinent labs & imaging results that were available during my care of the patient were reviewed by me and considered in my medical decision making (see chart for details).    ____________________________________________   FINAL CLINICAL IMPRESSION(S)  / ED DIAGNOSES  Final diagnoses:  Chest pain, unspecified type      NEW MEDICATIONS STARTED DURING THIS VISIT:  New Prescriptions   No medications on file     Note:  This document was prepared using Dragon voice recognition software and may include unintentional dictation errors.    Nita Sickle, MD 05/30/17 [phone]

## 2017-05-30 NOTE — Assessment & Plan Note (Addendum)
Discussed different forms of smoking cessation with the patient, and I encouraged cessation. Patient requests trial of Chantix.  I discussed common side effects including but not limited to nausea/vomiting, vivid dreams, behaviour changes, depression, suicidal ideation; also explained possible increased cardiovascular risk. After discussion, patient opts to try quitting with Chantix. The 1-800-QUIT-NOW number given in patient instructions. Patient was encouraged to choose a quit date about 1 week after starting medicine. See AVS --> AFTER our discussion, near the end of the expected visit time, she developed chest tightness and mild diaphoresis; EMS was called; I am VOIDING the Rx for Chantix until she is seen in the ER and has her heart checked out; she never started this Rx today; it was not sent to the pharmacy

## 2017-05-30 NOTE — ED Notes (Signed)
Denies chest pain at this time.

## 2017-05-30 NOTE — Discharge Instructions (Signed)

## 2017-05-30 NOTE — ED Triage Notes (Signed)
Patient arrived by EMS from doctors office for routine checkup and started having chest pressure while there. Pt was given 324 aspirin and 1 nitro at doctors office. Denies any chest pressure upon arrival to ER. A&O x4.

## 2017-05-30 NOTE — ED Notes (Signed)
Patient given ginger ale. 

## 2017-05-30 NOTE — Assessment & Plan Note (Signed)
Will start medicine, discussed risk of stroke, DASH guidelines, smoking cessation

## 2017-05-30 NOTE — Assessment & Plan Note (Addendum)
Check EKG; no chest pain but reported chest tightness; check labs and offered referral to ER

## 2017-05-30 NOTE — Assessment & Plan Note (Signed)
Called 911, EKG obtained

## 2017-05-31 ENCOUNTER — Telehealth: Payer: Self-pay | Admitting: Family Medicine

## 2017-05-31 DIAGNOSIS — M5440 Lumbago with sciatica, unspecified side: Principal | ICD-10-CM

## 2017-05-31 DIAGNOSIS — J439 Emphysema, unspecified: Secondary | ICD-10-CM | POA: Insufficient documentation

## 2017-05-31 DIAGNOSIS — G8929 Other chronic pain: Secondary | ICD-10-CM

## 2017-05-31 NOTE — Telephone Encounter (Signed)
Pt said tho tell the Dr Danae Chen Bonita Quin and that she is so glad you were with her and that she is going to a Cardiology.  Pt said that she knows that you and her did not get to talk about her referral for her sciatic. Does she need to come back in or will you do this for her. She also said that she received the message from the office about neproxin.

## 2017-05-31 NOTE — Assessment & Plan Note (Signed)
Start back on cymbalta; taper up gradually

## 2017-05-31 NOTE — Telephone Encounter (Signed)
Referral entered I'm glad she is going to see the cardiologist We're always happy to help and we're glad she was here when her symptoms happened

## 2017-05-31 NOTE — Telephone Encounter (Signed)
Left detailed voicemial 

## 2017-05-31 NOTE — Assessment & Plan Note (Signed)
Encouraged smoking cessation 

## 2017-06-01 NOTE — Telephone Encounter (Signed)
Lft message for patient.

## 2017-06-04 ENCOUNTER — Other Ambulatory Visit: Payer: Self-pay

## 2017-06-04 MED ORDER — ATORVASTATIN CALCIUM 40 MG PO TABS: 40.0000 mg | ORAL_TABLET | Freq: Every day | ORAL | 0 refills | Status: DC

## 2017-06-04 NOTE — Telephone Encounter (Signed)
Wanted to also let u know has not had cig in 4 days

## 2017-06-04 NOTE — Telephone Encounter (Signed)
Please ask pt if she will have the TSH and lipid panel done soon They did a BMP at the hospital, but I still need to see her liver function tests Please cancel the CMP and just order hepatic function panel to have done with the TSH and lipid panel please Thank you (diagnosis will be medication monitoring) Let her know that I am SOOO very proud of her for not smoking for four days!

## 2017-06-05 ENCOUNTER — Other Ambulatory Visit: Payer: Self-pay

## 2017-06-05 DIAGNOSIS — Z5181 Encounter for therapeutic drug level monitoring: Secondary | ICD-10-CM

## 2017-06-05 DIAGNOSIS — E785 Hyperlipidemia, unspecified: Secondary | ICD-10-CM

## 2017-06-05 NOTE — Telephone Encounter (Signed)
Left detailed voicmail 

## 2017-06-05 NOTE — Telephone Encounter (Signed)
Ok I have ordered hepatic function panel and lipid, but looks like she had TSH at hospital.  Do you still want?

## 2017-06-05 NOTE — Telephone Encounter (Signed)
Good catch! Thank you for seeing that. I do not need another TSH, then Thank you

## 2017-06-15 ENCOUNTER — Telehealth: Payer: Self-pay | Admitting: Family Medicine

## 2017-06-15 ENCOUNTER — Encounter: Payer: Medicare Other | Admitting: Family Medicine

## 2017-06-15 NOTE — Telephone Encounter (Signed)
Copied from CRM [phone]. Topic: General - Other >> Jun 15, 2017  2:55 PM Yvonna Alanisobinson, Andra M wrote: Reason for CRM: Patient wants Dr. Sherie DonLada to know that she saw the Neuro Surgeon, Started Gabapentin and PT, received X-Ray and will have an MRI that has been scheduled. All is good, and still no smoking.

## 2017-06-20 ENCOUNTER — Other Ambulatory Visit: Payer: Self-pay | Admitting: Student

## 2017-06-20 DIAGNOSIS — M79604 Pain in right leg: Secondary | ICD-10-CM

## 2017-06-26 ENCOUNTER — Ambulatory Visit
Admission: RE | Admit: 2017-06-26 | Discharge: 2017-06-26 | Disposition: A | Discharge status: Home / Self Care | Payer: Medicare Other | Source: Ambulatory Visit | Attending: Student | Admitting: Student

## 2017-06-26 ENCOUNTER — Other Ambulatory Visit: Payer: Self-pay | Admitting: Family Medicine

## 2017-06-26 DIAGNOSIS — M79604 Pain in right leg: Secondary | ICD-10-CM | POA: Diagnosis present

## 2017-06-26 DIAGNOSIS — M48061 Spinal stenosis, lumbar region without neurogenic claudication: Secondary | ICD-10-CM | POA: Insufficient documentation

## 2017-06-26 DIAGNOSIS — M47896 Other spondylosis, lumbar region: Secondary | ICD-10-CM | POA: Insufficient documentation

## 2017-06-26 MED ORDER — DULOXETINE HCL 60 MG PO CPEP
60.0000 mg | ORAL_CAPSULE | Freq: Every day | ORAL | 11 refills | Status: DC
Start: 2017-06-26 — End: 2017-08-24

## 2017-06-26 NOTE — Telephone Encounter (Signed)
Rx request was for 30 mg pills We are tapering her up 30 mg denied New Rx for 60 mg written

## 2017-07-02 ENCOUNTER — Other Ambulatory Visit: Payer: Self-pay

## 2017-07-02 MED ORDER — ATORVASTATIN CALCIUM 40 MG PO TABS: 40.0000 mg | ORAL_TABLET | Freq: Every day | ORAL | 0 refills | Status: DC

## 2017-07-02 NOTE — Telephone Encounter (Signed)
Left detailed voicemail

## 2017-07-02 NOTE — Telephone Encounter (Signed)
Please remind patient to have fasting labs done soon Thank you (I'll refill cholesterol medicine in the meantime)

## 2017-07-02 NOTE — Telephone Encounter (Signed)
Needs 90 day supply 

## 2017-07-03 ENCOUNTER — Ambulatory Visit: Payer: Medicare Other | Admitting: Cardiovascular Disease

## 2017-07-06 ENCOUNTER — Telehealth: Payer: Self-pay | Admitting: Family Medicine

## 2017-07-06 NOTE — Telephone Encounter (Signed)
Copied from CRM #[id number]. Topic: Quick Communication - See Telephone Encounter >> Jul 06, 2017 10:10 AM Lambe, Annette S wrote: CRM for notification. See Telephone encounter for:  message for Dr. Lada. Patient is out of Cymbalta, she took last pill yesterday. Needs new prescription, She said going good and said can increase.  She has not had any more episodes.  Appt with Cardiologist until 12/20 MRI done on the 11/6 and have not heard anything back from neurosurgery.  Still have not had cigarettes !!! [facility] [address]   07/06/17. 

## 2017-07-06 NOTE — Telephone Encounter (Signed)
Please have her contact her pharmacy or resolve the cymbalta issue with pharmacy today; she should not run out of cymbalta and I approved a year on 06/26/17 Please also resolve the metoprolol, as she should not be out of that or need a 30 day supply; I wrote for 30 pills with 5 of refills on 05/30/17

## 2017-07-06 NOTE — Telephone Encounter (Signed)
Copied from CRM [id number]. Topic: Quick Communication - See Telephone Encounter >> Jul 06, 2017 10:10 AM Cipriano BunkerLambe, Annette S wrote: CRM for notification. See Telephone encounter for:  message for Dr. Sherie DonLada. Patient is out of Cymbalta, she took last pill yesterday. Needs new prescription, She said going good and said can increase.  She has not had any more episodes.  Appt with Cardiologist until 12/20 MRI done on the 11/6 and have not heard anything back from neurosurgery.  Still have not had cigarettes !!! [facility] [address]   07/06/17.

## 2017-07-06 NOTE — Telephone Encounter (Signed)
Copied from CRM [id number]. Topic: Quick Communication - See Telephone Encounter >> Jul 06, 2017 10:21 AM Guinevere FerrariMorris, Vane Yapp E, NT wrote: CRM for notification. See Telephone encounter for: pt called in about a medication refill metoprolol succinate (TOPROL-XL) 25 MG 24 hr tablet. Pt asked if the doctor could decrease her supply from 90 to 30 because she can't afford to get that supply dosage at this time. [facility] [address] [place].  07/06/17.

## 2017-07-06 NOTE — Telephone Encounter (Signed)
Left voicemail with patient and with pharmacy [facility]

## 2017-07-24 ENCOUNTER — Encounter: Payer: BC Managed Care – PPO | Admitting: Family Medicine

## 2017-08-09 ENCOUNTER — Ambulatory Visit: Payer: Medicare Other | Admitting: Internal Medicine

## 2017-08-09 NOTE — Progress Notes (Deleted)
New Outpatient Visit Date: 08/09/2017  Referring Provider: Kerman PasseyLada, Melinda P, MD [address] [place], [place] [address]  Chief Complaint: ***  HPI:  Jenna Meyers is a 61 y.o. female who is being seen today for the evaluation of chest pain. She has a history of hyperlipidemia, COPD, fibromyalgia, and palpitations.  She was evaluated in the emergency department in early October due to chest pain.  --------------------------------------------------------------------------------------------------  Cardiovascular History & Procedures: Cardiovascular Problems:  ***  Risk Factors:  ***  Cath/PCI:  ***  CV Surgery:  ***  EP Procedures and Devices:  ***  Non-Invasive Evaluation(s):  ***  Recent CV Pertinent Labs: Lab Results  Component Value Date   CHOL 182 02/09/2016   CHOL 234 (H) 09/03/2015   HDL 50 02/09/2016   HDL 46 09/03/2015   LDLCALC 109 02/09/2016   LDLCALC 164 (H) 09/03/2015   TRIG 113 02/09/2016   CHOLHDL 3.6 02/09/2016   K 4.0 05/30/2017   K 4.2 04/17/2014   BUN 15 05/30/2017   BUN 11 09/03/2015   BUN 19 (H) 04/17/2014   CREATININE 0.92 05/30/2017   CREATININE 0.94 02/09/2016    --------------------------------------------------------------------------------------------------  Past Medical History:  Diagnosis Date  . Arthritis    back  . Depression   . Emphysema lung (HCC)   . Fibromyalgia   . Hyperlipidemia   . Lumbago   . Tobacco abuse   . Vitamin D deficiency disease     Past Surgical History:  Procedure Laterality Date  . ABDOMINAL HYSTERECTOMY  2000   partial, still has cervix  . CESAREAN SECTION  1996  . LAPAROSCOPIC ABDOMINAL EXPLORATION  1999  . TONSILLECTOMY AND ADENOIDECTOMY  1962    No outpatient medications have been marked as taking for the 08/09/17 encounter (Appointment) with Taqwa Deem, Cristal Deerhristopher, MD.    Allergies: Patient has no known allergies.  Social History   Socioeconomic History  . Marital  status: Single    Spouse name: Not on file  . Number of children: Not on file  . Years of education: Not on file  . Highest education level: Not on file  Social Needs  . Financial resource strain: Not on file  . Food insecurity - worry: Not on file  . Food insecurity - inability: Not on file  . Transportation needs - medical: Not on file  . Transportation needs - non-medical: Not on file  Occupational History  . Not on file  Tobacco Use  . Smoking status: Current Every Day Smoker    Packs/day: 1.00    Years: 15.00    Pack years: 15.00    Types: Cigarettes  . Smokeless tobacco: Never Used  Substance and Sexual Activity  . Alcohol use: No  . Drug use: No  . Sexual activity: No  Other Topics Concern  . Not on file  Social History Narrative  . Not on file    Family History  Problem Relation Age of Onset  . Cancer Mother        colon  . Osteoporosis Mother   . Hyperlipidemia Mother   . Alzheimer's disease Father   . Thyroid disease Son   . Heart disease Brother     Review of Systems: A 12-system review of systems was performed and was negative except as noted in the HPI.  --------------------------------------------------------------------------------------------------  Physical Exam: There were no vitals taken for this visit.  General:  *** HEENT: No conjunctival pallor or scleral icterus. Moist mucous membranes. OP clear. Neck: Supple  without lymphadenopathy, thyromegaly, JVD, or HJR. No carotid bruit. Lungs: Normal work of breathing. Clear to auscultation bilaterally without wheezes or crackles. Heart: Regular rate and rhythm without murmurs, rubs, or gallops. Non-displaced PMI. Abd: Bowel sounds present. Soft, NT/ND without hepatosplenomegaly Ext: No lower extremity edema. Radial, PT, and DP pulses are 2+ bilaterally Skin: Warm and dry without rash. Neuro: CNIII-XII intact. Strength and fine-touch sensation intact in upper and lower extremities  bilaterally. Psych: Normal mood and affect.  EKG:  ***  Lab Results  Component Value Date   WBC 10.2 05/30/2017   HGB 13.6 05/30/2017   HCT 40.1 05/30/2017   MCV 91.5 05/30/2017   PLT 293 05/30/2017    Lab Results  Component Value Date   NA 141 05/30/2017   K 4.0 05/30/2017   CL 109 05/30/2017   CO2 22 05/30/2017   BUN 15 05/30/2017   CREATININE 0.92 05/30/2017   GLUCOSE 88 05/30/2017   ALT 16 06/28/2016    Lab Results  Component Value Date   CHOL 182 02/09/2016   HDL 50 02/09/2016   LDLCALC 109 02/09/2016   TRIG 113 02/09/2016   CHOLHDL 3.6 02/09/2016     --------------------------------------------------------------------------------------------------  ASSESSMENT AND PLAN: Cristal Deer***  Aryaman Haliburton, MD 08/09/2017 8:52 AM

## 2017-08-10 ENCOUNTER — Encounter: Payer: Self-pay | Admitting: Internal Medicine

## 2017-08-24 ENCOUNTER — Telehealth: Payer: Self-pay

## 2017-08-24 MED ORDER — DULOXETINE HCL 60 MG PO CPEP: 60.0000 mg | ORAL_CAPSULE | Freq: Every day | ORAL | 2 refills | Status: DC

## 2017-08-24 NOTE — Addendum Note (Signed)
Addended by: Ramsay Bognar, Janit BernMELINDA P on: 08/24/2017 05:01 PM   Modules accepted: Orders

## 2017-08-24 NOTE — Telephone Encounter (Signed)
Pt needs rx for duloxetine 60mg  changed to a 90 day supply please. Thanks!

## 2017-08-27 ENCOUNTER — Encounter: Payer: BC Managed Care – PPO | Admitting: Family Medicine

## 2017-09-11 ENCOUNTER — Telehealth: Payer: Self-pay

## 2017-09-11 ENCOUNTER — Ambulatory Visit: Payer: Medicare Other | Attending: Family Medicine

## 2017-09-11 ENCOUNTER — Other Ambulatory Visit: Payer: Self-pay

## 2017-09-11 DIAGNOSIS — F439 Reaction to severe stress, unspecified: Secondary | ICD-10-CM

## 2017-09-11 DIAGNOSIS — E785 Hyperlipidemia, unspecified: Secondary | ICD-10-CM

## 2017-09-11 DIAGNOSIS — I10 Essential (primary) hypertension: Secondary | ICD-10-CM

## 2017-09-11 DIAGNOSIS — M797 Fibromyalgia: Secondary | ICD-10-CM

## 2017-09-11 DIAGNOSIS — Z1211 Encounter for screening for malignant neoplasm of colon: Secondary | ICD-10-CM

## 2017-09-11 NOTE — Telephone Encounter (Signed)
Pt has recently changed to [facility] pt needs meds sent there now. Thanks

## 2017-09-11 NOTE — Telephone Encounter (Signed)
Please order any and all needed to resolve her health maintenance list; if CPE due, please schedule Thank you

## 2017-09-11 NOTE — Telephone Encounter (Signed)
Can you check with local pharmacy to see if these were actually filled after I prescribed them? I don't like to do duplicates If she had them filled earlier this month, then I'll send these out in mid March I cannot send out the statin, because she is overdue for labs Please ask her to come in for an appointment to see me and we'll get labs at her visit I have only seen her once since June 2017 and we have more to cover and her last BP was high Thank you

## 2017-09-11 NOTE — Telephone Encounter (Signed)
Copied from Page [phone]. Topic: Inquiry >> Sep 10, 2017  3:36 PM Mock, Marnette Burgess R wrote: Reason for CRM: Patient stated that she has not received her Cologuard Kit and that she now uses [facility]  I don't see that this was ever order?

## 2017-09-12 NOTE — Telephone Encounter (Signed)
Appt made for Friday 09/14/17

## 2017-09-12 NOTE — Telephone Encounter (Signed)
Can we please call as have this pt scheduled for a f/u visit with Dr.Lada in order to get cholesterol meds filled. Thanks!

## 2017-09-14 ENCOUNTER — Encounter: Payer: Self-pay | Admitting: Family Medicine

## 2017-09-14 ENCOUNTER — Ambulatory Visit: Payer: Medicare Other | Admitting: Family Medicine

## 2017-09-14 DIAGNOSIS — E785 Hyperlipidemia, unspecified: Secondary | ICD-10-CM | POA: Diagnosis not present

## 2017-09-14 DIAGNOSIS — I10 Essential (primary) hypertension: Secondary | ICD-10-CM | POA: Diagnosis not present

## 2017-09-14 DIAGNOSIS — M48061 Spinal stenosis, lumbar region without neurogenic claudication: Secondary | ICD-10-CM

## 2017-09-14 DIAGNOSIS — M797 Fibromyalgia: Secondary | ICD-10-CM

## 2017-09-14 DIAGNOSIS — R0789 Other chest pain: Secondary | ICD-10-CM

## 2017-09-14 DIAGNOSIS — R002 Palpitations: Secondary | ICD-10-CM

## 2017-09-14 HISTORY — DX: Spinal stenosis, lumbar region without neurogenic claudication: M48.061

## 2017-09-14 LAB — HEPATIC FUNCTION PANEL
AG RATIO: 1.5 (calc) (ref 1.0–2.5)
ALKALINE PHOSPHATASE (APISO): 77 U/L (ref 33–130)
ALT: 20 U/L (ref 6–29)
AST: 15 U/L (ref 10–35)
Albumin: 4.1 g/dL (ref 3.6–5.1)
BILIRUBIN TOTAL: 0.4 mg/dL (ref 0.2–1.2)
Bilirubin, Direct: 0.1 mg/dL (ref 0.0–0.2)
Globulin: 2.7 g/dL (calc) (ref 1.9–3.7)
Indirect Bilirubin: 0.3 mg/dL (calc) (ref 0.2–1.2)
Total Protein: 6.8 g/dL (ref 6.1–8.1)

## 2017-09-14 LAB — LIPID PANEL
Cholesterol: 215 mg/dL — ABNORMAL HIGH (ref <200)
HDL: 48 mg/dL — ABNORMAL LOW (ref >50)
LDL Cholesterol (Calc): 129 mg/dL (calc) — ABNORMAL HIGH
NON-HDL CHOLESTEROL (CALC): 167 mg/dL — AB (ref <130)
Total CHOL/HDL Ratio: 4.5 (calc) (ref <5.0)
Triglycerides: 233 mg/dL — ABNORMAL HIGH (ref <150)

## 2017-09-14 MED ORDER — DULOXETINE HCL 60 MG PO CPEP: 60.0000 mg | ORAL_CAPSULE | Freq: Every day | ORAL | 2 refills | Status: DC

## 2017-09-14 MED ORDER — METOPROLOL SUCCINATE ER 25 MG PO TB24: 25.0000 mg | ORAL_TABLET | Freq: Every day | ORAL | 3 refills | Status: DC

## 2017-09-14 MED ORDER — DULOXETINE HCL 30 MG PO CPEP: 30.0000 mg | ORAL_CAPSULE | Freq: Every day | ORAL | 0 refills | Status: DC

## 2017-09-14 NOTE — Assessment & Plan Note (Signed)
Patient seeing specialist; glad to hear that she has gotten some relief lately; will use lower dose of cymbalta for now

## 2017-09-14 NOTE — Assessment & Plan Note (Signed)
Patient will be seeing cardiologist soon

## 2017-09-14 NOTE — Patient Instructions (Addendum)
  I do encourage you to quit smoking Call [phone] to sign up for smoking cessation classes You can call 1-800-QUIT-NOW to talk with a smoking cessation coach  Try to limit saturated fats in your diet (bologna, hot dogs, barbeque, cheeseburgers, hamburgers, steak, bacon, sausage, cheese, etc.) and get more fresh fruits, vegetables, and whole grains  We'll get labs today If you have not heard anything from my staff in a week about any orders/referrals/studies from today, please contact us here to follow-up [phone]

## 2017-09-14 NOTE — Assessment & Plan Note (Signed)
Patient may take statin in the morning if forgetting to take it at night 1/3 of the time; check lipids today, adjust dose if needed

## 2017-09-14 NOTE — Assessment & Plan Note (Signed)
Start back on 30 mg cymbalta daily x 1 week, then go to 60 mg daily

## 2017-09-14 NOTE — Progress Notes (Signed)
BP 122/76   Pulse 95   Temp 97.7 F (36.5 C) (Oral)   Resp 16   Wt 213 lb 11.2 oz (96.9 kg)   SpO2 96%   BMI 36.68 kg/m    Subjective:    Patient ID: Jenna Meyers, female    DOB: 26-Sep-1955, 62 y.o.   MRN: [id number]  HPI: Jenna Meyers is a 62 y.o. female  Chief Complaint  Patient presents with  . Follow-up    HPI Patient is here for follow-up  Hypertension; checks BP away from the doctor; 120s over 80; not adding salt to her food except very few things; on toprol XL  High cholesterol; does pretty well with her diet; rarely eats sausage, never eats bologna; hot dogs 4x a year; hamburgers just occasionally; rarely eats eggs; does like cheese, but moderates that too; taking atorvastatin daily she reports; probably misses 1/3 of the nights, wants to know if okay to take daytime  Lab Results  Component Value Date   CHOL 182 02/09/2016   HDL 50 02/09/2016   LDLCALC 109 02/09/2016   TRIG 113 02/09/2016   CHOLHDL 3.6 02/09/2016   She has not seen the cardiologist yet, but has an appt forthcoming  She saw the neurosurgeon, was told she had a bulging disc Sciatica went away almost, going to see Dr. Yves Dill for consideration for injections and pain management Pain went away for the most part 4-5 days ago Does not want to be on scheduled narcotics again, did that for 17 years and doesn't want that again Arthritis possibly in both hips and bursitis in both shoulders; getting older; hand joints, weather-related Wants to see if she can get something PRN or even guidance on OTCs Can go to the [facility] for free now; will start with exercise She is out of cymbalta right now; Rx was sent on 08/24/17 Has transportation issues, could not pick it up; currently not taking any cymbalta for about a week  MRI Lumbar spine 06/26/17: IMPRESSION: 1. Lumbar degenerative changes greatest at the L4-5 level where there is multifactorial moderate to severe canal stenosis as well as lateral  recess effacement with disc contact on descending S1 nerve roots. 2. Multilevel mild foraminal stenosis. No high-grade foraminal stenosis.   Electronically Signed   By: Mitzi Hansen M.D.   On: 06/26/2017 22:44  Depression screen [facility] 2/9 09/14/2017 02/09/2016  Decreased Interest 0 0  Down, Depressed, Hopeless 0 0  PHQ - 2 Score 0 0    Relevant past medical, surgical, family and social history reviewed Past Medical History:  Diagnosis Date  . Arthritis    back  . Depression   . Emphysema lung (HCC)   . Fibromyalgia   . Hyperlipidemia   . Lumbago   . Lumbar canal stenosis 09/14/2017   Moderate to severe, MRI Nov 2018  . Tobacco abuse   . Vitamin D deficiency disease    Past Surgical History:  Procedure Laterality Date  . ABDOMINAL HYSTERECTOMY  2000   partial, still has cervix  . CESAREAN SECTION  1996  . LAPAROSCOPIC ABDOMINAL EXPLORATION  1999  . TONSILLECTOMY AND ADENOIDECTOMY  1962   Family History  Problem Relation Age of Onset  . Cancer Mother        colon  . Osteoporosis Mother   . Hyperlipidemia Mother   . Alzheimer's disease Father   . Thyroid disease Son   . Heart disease Brother    Social History   Tobacco Use  .  Smoking status: Current Every Day Smoker    Packs/day: 0.25    Years: 15.00    Pack years: 3.75    Types: Cigarettes  . Smokeless tobacco: Never Used  Substance Use Topics  . Alcohol use: No  . Drug use: No    Interim medical history since last visit reviewed. Allergies and medications reviewed  Review of Systems Per HPI unless specifically indicated above     Objective:    BP 122/76   Pulse 95   Temp 97.7 F (36.5 C) (Oral)   Resp 16   Wt 213 lb 11.2 oz (96.9 kg)   SpO2 96%   BMI 36.68 kg/m   Wt Readings from Last 3 Encounters:  09/14/17 213 lb 11.2 oz (96.9 kg)  05/30/17 216 lb (98 kg)  05/30/17 216 lb 11.2 oz (98.3 kg)    Physical Exam  Constitutional: She appears well-developed and well-nourished.  No distress.  HENT:  Head: Normocephalic and atraumatic.  Eyes: EOM are normal. No scleral icterus.  Neck: No thyromegaly present.  Cardiovascular: Normal rate, regular rhythm and normal heart sounds.  No murmur heard. Pulmonary/Chest: Effort normal and breath sounds normal. No respiratory distress. She has no wheezes.  Abdominal: Soft. Bowel sounds are normal. She exhibits no distension.  Musculoskeletal: Normal range of motion. She exhibits no edema.  Neurological: She is alert. She exhibits normal muscle tone.  Skin: Skin is warm and dry. She is not diaphoretic. No pallor.  Psychiatric: She has a normal mood and affect. Her behavior is normal.       Assessment & Plan:   Problem List Items Addressed This Visit      Cardiovascular and Mediastinum   Essential hypertension, benign    Fair control; continue medicine; limit salt, working on weight      Relevant Medications   metoprolol succinate (TOPROL-XL) 25 MG 24 hr tablet   atorvastatin (LIPITOR) 40 MG tablet     Other   Palpitations    She will be seeing cardiologist soon      Lumbar canal stenosis    Patient seeing specialist; glad to hear that she has gotten some relief lately; will use lower dose of cymbalta for now      Hyperlipidemia    Patient may take statin in the morning if forgetting to take it at night 1/3 of the time; check lipids today, adjust dose if needed      Relevant Medications   metoprolol succinate (TOPROL-XL) 25 MG 24 hr tablet   atorvastatin (LIPITOR) 40 MG tablet   Fibromyalgia    Start back on 30 mg cymbalta daily x 1 week, then go to 60 mg daily      Chest tightness or pressure    Patient will be seeing cardiologist soon          Follow up plan: No Follow-up on file.  An after-visit summary was printed and given to the patient at check-out.  Please see the patient instructions which may contain other information and recommendations beyond what is mentioned above in the assessment  and plan.  Meds ordered this encounter  Medications  . DULoxetine (CYMBALTA) 30 MG capsule    Sig: Take 1 capsule (30 mg total) by mouth daily.    Dispense:  7 capsule    Refill:  0  . DULoxetine (CYMBALTA) 60 MG capsule    Sig: Take 1 capsule (60 mg total) by mouth daily.    Dispense:  90 capsule  Refill:  2  . metoprolol succinate (TOPROL-XL) 25 MG 24 hr tablet    Sig: Take 1 tablet (25 mg total) by mouth daily. For blood pressure    Dispense:  90 tablet    Refill:  3    Disregard the Rx for amlodipine    No orders of the defined types were placed in this encounter.   Labs done today, ordered previously, released by CMA

## 2017-09-14 NOTE — Assessment & Plan Note (Signed)
Fair control; continue medicine; limit salt, working on weight

## 2017-09-14 NOTE — Assessment & Plan Note (Signed)
She will be seeing cardiologist soon

## 2017-09-17 ENCOUNTER — Other Ambulatory Visit: Payer: Self-pay | Admitting: Family Medicine

## 2017-09-17 MED ORDER — ATORVASTATIN CALCIUM 40 MG PO TABS: 40.0000 mg | ORAL_TABLET | Freq: Every day | ORAL | 3 refills | Status: DC

## 2017-09-17 NOTE — Progress Notes (Signed)
Continue statin, but take every day; recheck in 3 months

## 2017-09-18 ENCOUNTER — Other Ambulatory Visit: Payer: Self-pay

## 2017-09-18 DIAGNOSIS — E785 Hyperlipidemia, unspecified: Secondary | ICD-10-CM

## 2017-09-19 ENCOUNTER — Telehealth: Payer: Self-pay | Admitting: Family Medicine

## 2017-09-19 NOTE — Telephone Encounter (Signed)
Copied from CRM [id number]. Topic: General - Other >> Sep 19, 2017  3:33 PM Raquel SarnaHayes, Teresa G wrote: Atovastatin 40 mg  Needing approval for Rx.  Truddie HiddenLou w/ [facility] [id number] [phone]  ref # [id number]

## 2017-09-20 MED ORDER — ATORVASTATIN CALCIUM 40 MG PO TABS: 40.0000 mg | ORAL_TABLET | Freq: Every day | ORAL | 0 refills | Status: DC

## 2017-10-11 ENCOUNTER — Encounter: Payer: Self-pay | Admitting: Family Medicine

## 2017-10-14 NOTE — Progress Notes (Deleted)
Cardiology Office Note  Date:  10/14/2017   ID:  Jenna PennerKatherine Meyers, DOB 11/21/1955, MRN [id number]  PCP:  Kerman PasseyLada, Melinda P, MD   No chief complaint on file.   HPI:    HTN Hyperlipidemia Emphysema, smoker Spinal stenosis fibromyalgias Referred by dr Sherie DonLada for chest pain/pressure  PMH:   has a past medical history of Arthritis, Depression, Emphysema lung (HCC), Fibromyalgia, Hyperlipidemia, Lumbago, Lumbar canal stenosis (09/14/2017), Tobacco abuse, and Vitamin D deficiency disease.  PSH:    Past Surgical History:  Procedure Laterality Date  . ABDOMINAL HYSTERECTOMY  2000   partial, still has cervix  . CESAREAN SECTION  1996  . LAPAROSCOPIC ABDOMINAL EXPLORATION  1999  . TONSILLECTOMY AND ADENOIDECTOMY  1962    Current Outpatient Medications  Medication Sig Dispense Refill  . albuterol (PROVENTIL HFA) 108 (90 Base) MCG/ACT inhaler Inhale 2 puffs into the lungs every 4 (four) hours as needed for wheezing or shortness of breath. 1 Inhaler 1  . aspirin EC 81 MG tablet Take 1 tablet (81 mg total) by mouth daily. Take at least one hour before naproxen    . atorvastatin (LIPITOR) 40 MG tablet Take 1 tablet (40 mg total) by mouth daily. 90 tablet 0  . cyclobenzaprine (FLEXERIL) 5 MG tablet Take 1 tablet (5 mg total) by mouth 3 (three) times daily as needed for muscle spasms. 15 tablet 0  . DULoxetine (CYMBALTA) 30 MG capsule Take 1 capsule (30 mg total) by mouth daily. 7 capsule 0  . DULoxetine (CYMBALTA) 60 MG capsule Take 1 capsule (60 mg total) by mouth daily. 90 capsule 2  . metoprolol succinate (TOPROL-XL) 25 MG 24 hr tablet Take 1 tablet (25 mg total) by mouth daily. For blood pressure 90 tablet 3  . Turmeric Curcumin 500 MG CAPS Take 500 mg by mouth 2 (two) times daily.      No current facility-administered medications for this visit.      Allergies:   Patient has no known allergies.   Social History:  The patient  reports that she has been smoking cigarettes.  She has a 3.75  pack-year smoking history. she has never used smokeless tobacco. She reports that she does not drink alcohol or use drugs.   Family History:   family history includes Alzheimer's disease in her father; Cancer in her mother; Heart disease in her brother; Hyperlipidemia in her mother; Osteoporosis in her mother; Thyroid disease in her son.    Review of Systems: ROS   PHYSICAL EXAM: VS:  There were no vitals taken for this visit. , BMI There is no height or weight on file to calculate BMI. GEN: Well nourished, well developed, in no acute distress HEENT: normal Neck: no JVD, carotid bruits, or masses Cardiac: RRR; no murmurs, rubs, or gallops,no edema  Respiratory:  clear to auscultation bilaterally, normal work of breathing GI: soft, nontender, nondistended, + BS MS: no deformity or atrophy Skin: warm and dry, no rash Neuro:  Strength and sensation are intact Psych: euthymic mood, full affect    Recent Labs: 05/30/2017: BUN 15; Creatinine, Ser 0.92; Hemoglobin 13.6; Platelets 293; Potassium 4.0; Sodium 141; TSH 3.968 09/14/2017: ALT 20    Lipid Panel Lab Results  Component Value Date   CHOL 215 (H) 09/14/2017   HDL 48 (L) 09/14/2017   LDLCALC 109 02/09/2016   TRIG 233 (H) 09/14/2017      Wt Readings from Last 3 Encounters:  09/14/17 213 lb 11.2 oz (96.9 kg)  05/30/17 216 lb (98  kg)  05/30/17 216 lb 11.2 oz (98.3 kg)       ASSESSMENT AND PLAN:  No diagnosis found.   Disposition:   F/U  6 months  No orders of the defined types were placed in this encounter.    Signed, Dossie Arbour, M.D., Ph.D. 10/14/2017  [facility] [place], [place] [phone]

## 2017-10-16 ENCOUNTER — Ambulatory Visit: Payer: Medicare Other | Admitting: Cardiovascular Disease

## 2017-10-17 ENCOUNTER — Encounter: Payer: Self-pay | Admitting: Cardiovascular Disease

## 2017-11-08 ENCOUNTER — Other Ambulatory Visit: Payer: Self-pay | Admitting: Family Medicine

## 2017-11-08 NOTE — Telephone Encounter (Signed)
Lipitor requested; too soon Since she uses mail order, please ask her to come in for fasting labs in the next few weeks so we can see if this is the right dose before sending off more medicine Thank you

## 2017-11-08 NOTE — Telephone Encounter (Signed)
Called.

## 2017-11-08 NOTE — Telephone Encounter (Signed)
Called pt, no answer. LM for pt informing her of the need to come in and have labs done. CRM created.

## 2018-02-12 ENCOUNTER — Other Ambulatory Visit: Payer: Self-pay | Admitting: Family Medicine

## 2018-02-28 ENCOUNTER — Telehealth: Payer: Self-pay | Admitting: Family Medicine

## 2018-02-28 ENCOUNTER — Other Ambulatory Visit: Payer: Self-pay | Admitting: Family Medicine

## 2018-02-28 NOTE — Telephone Encounter (Signed)
Thank you; please update med list

## 2018-02-28 NOTE — Telephone Encounter (Signed)
Copied from CRM [id number]. Topic: Inquiry >> Feb 28, 2018 11:34 AM Tamela OddiMartin, Don'Quashia, NT wrote: Reason for CRM: Patient called wanted to give Dr. Sherie DonLada an update on her visit to the Cardiologist. She states her cholesterol and Bp was elevated and the PA Caroleen Hamman( Kristin  Cunningham ) at Dr [facility] office started her on a new medication lisinopril 10mg  once a day . Next week she has a stress test , ECHO, and an appt again for a f/u with the cardiologist. She just wanted to make Dr. Sherie DonLada aware .

## 2018-02-28 NOTE — Telephone Encounter (Signed)
updated

## 2018-02-28 NOTE — Telephone Encounter (Signed)
Copied from CRM [phone]#[id number]. Topic: Quick Communication - See Telephone Encounter >> Feb 28, 2018 11:38 AM Tamela OddiMartin, Don'Quashia, NT wrote: CRM for notification. See Telephone encounter for: 02/28/18. Patient states she needs a refill of her atorvastatin (LIPITOR) 40 MG tablet. She states she would have a month supply until she comes see Dr. Sherie DonLada at the end of this month.   [facility] [id number] [facility] [place] - [address] [phone] (Phone) [phone] (Fax)

## 2018-03-04 ENCOUNTER — Ambulatory Visit: Payer: Medicare Other | Admitting: Family Medicine

## 2018-03-04 MED ORDER — ATORVASTATIN CALCIUM 40 MG PO TABS: 40.0000 mg | ORAL_TABLET | Freq: Every day | ORAL | 0 refills | Status: DC

## 2018-03-04 NOTE — Telephone Encounter (Signed)
She was actually due to have her cholesterol rechecked in April; please see lipid panel results drawn 09/14/2017 I only prescribed a 90 day supply in January, so I'm not sure how it's lasted this long I'll refill it but in the future, we really hope to keep up with labs to ensure that the dose is correct

## 2018-03-04 NOTE — Telephone Encounter (Signed)
Patient checking status. Please advise °

## 2018-03-05 NOTE — Telephone Encounter (Signed)
Left detailed voicemail

## 2018-03-14 ENCOUNTER — Ambulatory Visit
Admission: RE | Admit: 2018-03-14 | Discharge: 2018-03-14 | Disposition: A | Discharge status: Home / Self Care | Payer: Medicare Other | Source: Ambulatory Visit | Attending: Family Medicine | Admitting: Family Medicine

## 2018-03-14 DIAGNOSIS — Z1231 Encounter for screening mammogram for malignant neoplasm of breast: Secondary | ICD-10-CM | POA: Insufficient documentation

## 2018-03-14 DIAGNOSIS — Z1239 Encounter for other screening for malignant neoplasm of breast: Secondary | ICD-10-CM

## 2018-03-15 ENCOUNTER — Other Ambulatory Visit: Payer: Self-pay | Admitting: Family Medicine

## 2018-03-15 DIAGNOSIS — N6489 Other specified disorders of breast: Secondary | ICD-10-CM

## 2018-03-15 DIAGNOSIS — R928 Other abnormal and inconclusive findings on diagnostic imaging of breast: Secondary | ICD-10-CM

## 2018-03-18 ENCOUNTER — Ambulatory Visit: Payer: Medicare Other | Admitting: Family Medicine

## 2018-03-19 ENCOUNTER — Encounter: Payer: Self-pay | Admitting: Family Medicine

## 2018-03-19 DIAGNOSIS — I517 Cardiomegaly: Secondary | ICD-10-CM | POA: Insufficient documentation

## 2018-03-19 DIAGNOSIS — I519 Heart disease, unspecified: Secondary | ICD-10-CM | POA: Insufficient documentation

## 2018-03-19 DIAGNOSIS — I5189 Other ill-defined heart diseases: Secondary | ICD-10-CM

## 2018-03-19 DIAGNOSIS — I340 Nonrheumatic mitral (valve) insufficiency: Secondary | ICD-10-CM | POA: Insufficient documentation

## 2018-03-19 DIAGNOSIS — I351 Nonrheumatic aortic (valve) insufficiency: Secondary | ICD-10-CM

## 2018-03-19 HISTORY — DX: Nonrheumatic aortic (valve) insufficiency: I35.1

## 2018-03-19 HISTORY — DX: Nonrheumatic mitral (valve) insufficiency: I34.0

## 2018-03-19 HISTORY — DX: Other ill-defined heart diseases: I51.89

## 2018-03-19 HISTORY — DX: Cardiomegaly: I51.7

## 2018-03-20 ENCOUNTER — Encounter: Payer: Self-pay | Admitting: Family Medicine

## 2018-03-25 ENCOUNTER — Ambulatory Visit: Payer: Medicare Other | Admitting: Family Medicine

## 2018-04-02 ENCOUNTER — Ambulatory Visit: Payer: Medicare Other

## 2018-04-02 ENCOUNTER — Telehealth: Payer: Self-pay

## 2018-04-02 NOTE — Telephone Encounter (Signed)
Pt NS'd for 04/02/18 AWV w/ [facility]. Called pt to resched appt. LVM requesting returned call.

## 2018-04-04 ENCOUNTER — Ambulatory Visit: Payer: Medicare Other | Admitting: Family Medicine

## 2018-04-04 ENCOUNTER — Encounter: Payer: Self-pay | Admitting: Family Medicine

## 2018-04-04 VITALS — BP 126/84 | HR 98 | Temp 98.3°F | Ht 64.0 in | Wt 209.4 lb

## 2018-04-04 DIAGNOSIS — I351 Nonrheumatic aortic (valve) insufficiency: Secondary | ICD-10-CM

## 2018-04-04 DIAGNOSIS — Z720 Tobacco use: Secondary | ICD-10-CM

## 2018-04-04 DIAGNOSIS — Z23 Encounter for immunization: Secondary | ICD-10-CM

## 2018-04-04 DIAGNOSIS — E785 Hyperlipidemia, unspecified: Secondary | ICD-10-CM

## 2018-04-04 DIAGNOSIS — I10 Essential (primary) hypertension: Secondary | ICD-10-CM

## 2018-04-04 DIAGNOSIS — J439 Emphysema, unspecified: Secondary | ICD-10-CM

## 2018-04-04 DIAGNOSIS — M797 Fibromyalgia: Secondary | ICD-10-CM

## 2018-04-04 DIAGNOSIS — F331 Major depressive disorder, recurrent, moderate: Secondary | ICD-10-CM

## 2018-04-04 MED ORDER — VARENICLINE TARTRATE 0.5 MG X 11 & 1 MG X 42 PO MISC: ORAL | 0 refills | Status: DC

## 2018-04-04 MED ORDER — DULOXETINE HCL 30 MG PO CPEP: ORAL_CAPSULE | ORAL | 0 refills | Status: DC

## 2018-04-04 MED ORDER — VARENICLINE TARTRATE 1 MG PO TABS: 1.0000 mg | ORAL_TABLET | Freq: Two times a day (BID) | ORAL | 0 refills | Status: DC

## 2018-04-04 MED ORDER — DULOXETINE HCL 60 MG PO CPEP: 60.0000 mg | ORAL_CAPSULE | Freq: Every day | ORAL | 2 refills | Status: DC

## 2018-04-04 MED ORDER — ALBUTEROL SULFATE HFA 108 (90 BASE) MCG/ACT IN AERS: 2.0000 | INHALATION_SPRAY | RESPIRATORY_TRACT | 1 refills | Status: DC | PRN

## 2018-04-04 NOTE — Assessment & Plan Note (Signed)
So glad to hear that she is interested in quitting smoking Discussed different forms of smoking cessation with the patient, and I encouraged cessation. Patient requests trial of Chantix.  I discussed common side effects including but not limited to nausea/vomiting, vivid dreams, behaviour changes, depression, suicidal ideation; also explained possible increased cardiovascular risk. After discussion, patient opts to try quitting with Chantix. The 1-800-QUIT-NOW number given in patient instructions. Patient was encouraged to choose a quit date about 1 week after starting medicine.

## 2018-04-04 NOTE — Progress Notes (Signed)
BP 126/84   Pulse 98   Temp 98.3 F (36.8 C)   Ht 5' 4"  (1.626 m)   Wt 209 lb 6.4 oz (95 kg)   SpO2 97%   BMI 35.94 kg/m    Subjective:    Patient ID: Jenna Meyers, female    DOB: 06/04/1956, 62 y.o.   MRN: [id number]  HPI: Jenna Meyers is a 62 y.o. female  Chief Complaint  Patient presents with  . Follow-up  . Medication Refill    HPI Patient is here for follow-up  Hypertension rx lisinopril 52m daily and metoprolol 259mdaily. No missed doses or issues with medications. Denies chest pain, vision changes, orthostatic symptoms. Saw Dr. KhLaurelyn SickleA- cards seen last month and was cleared by cardiology- just annual visits. Negative stress test per pt.  BP Readings from Last 3 Encounters:  04/04/18 126/84  09/14/17 122/76  05/30/17 140/84   High cholesterol rx for lipitor 40 mg nightly; has been out of this for over a month. Has really worked on her diet; cut down on mayonnaise; eating more fruits and vegetables, whole grains, fish. Drinks lots of coffee, drinking more sodas then usual and lots of ice water and occasionally sweet tea.   Lab Results  Component Value Date   CHOL 215 (H) 09/14/2017   HDL 48 (L) 09/14/2017   LDLCALC 129 (H) 09/14/2017   TRIG 233 (H) 09/14/2017   CHOLHDL 4.5 09/14/2017    Emphysema rx for albuterol inhaler PRN- needs refill. Use it only if having upper respiratory infection not on routine basis. Needs refill for albuterol (done)  Fibromyalgia Has no symptoms for one month. States arthritis does seem to be getting worse though- hands, hips, shoulder and lower back. Stopped the cymbalta, too $$$, had to go paycheck to paycheck; not sleeping one night a week, worrying, mind running with things to worry about, so much stress; fibro fog as well, started up after she quit taking cymbalta  Depression was on cymbalta before but stopped due to finances. Stress level is high; now has a car now, has not had a car for 1.5 years States is  supporting son and his girlfriend and 2 80ear old grandson and has been financially strained through this. Things are looking better but didn't get all her meds do to this.    [facility] 04/04/2018 09/14/2017 02/09/2016  Decreased Interest 1 0 0  Down, Depressed, Hopeless 1 0 0  PHQ - 2 Score 2 0 0  Altered sleeping 1 - -  Tired, decreased energy 1 - -  Change in appetite 0 - -  Feeling bad or failure about yourself  1 - -  Trouble concentrating 1 - -  Moving slowly or fidgety/restless 0 - -  Suicidal thoughts 0 - -  PHQ-9 Score 6 - -  Difficult doing work/chores Somewhat difficult - -    Smoking Still smoking 1ppd. Wants to quit she wants to quit smoking; interested in chantix   Obesity BMI > 35 + hypertension, hyperlipidemia, depression, lumbago; doing much better with diet  Paps & Cologuard; has an appt on Sept 13th and [facility] past medical, surgical, family and social history reviewed Past Medical History:  Diagnosis Date  . Anxiety 10/09/2012  . Aortic insufficiency 03/19/2018   ECHO July 2019  . Arthritis    back  . Depression   . Emphysema lung ([facility]  . Fibromyalgia   . Grade I diastolic dysfunction  03/19/2018   ECHO July 2019  . Hyperlipidemia   . Lumbago   . Lumbar canal stenosis 09/14/2017   Moderate to severe, MRI Nov 2018  . LVH (left ventricular hypertrophy) 03/19/2018   ECHO July 2019  . Mitral regurgitation 03/19/2018   ECHO July 2019  . Tobacco abuse   . Vitamin D deficiency disease    Past Surgical History:  Procedure Laterality Date  . ABDOMINAL HYSTERECTOMY  2000   partial, still has cervix  . CESAREAN SECTION  1996  . LAPAROSCOPIC ABDOMINAL EXPLORATION  1999  . TONSILLECTOMY AND ADENOIDECTOMY  1962   Family History  Problem Relation Age of Onset  . Cancer Mother        colon  . Osteoporosis Mother   . Hyperlipidemia Mother   . Alzheimer's disease Father   . Thyroid disease Son   . Heart disease Brother      Social History   Tobacco Use  . Smoking status: Current Every Day Smoker    Packs/day: 0.25    Years: 15.00    Pack years: 3.75    Types: Cigarettes  . Smokeless tobacco: Never Used  Substance Use Topics  . Alcohol use: No  . Drug use: No    Interim medical history since last visit reviewed. Allergies and medications reviewed  Review of Systems Per HPI unless specifically indicated above     Objective:    BP 126/84   Pulse 98   Temp 98.3 F (36.8 C)   Ht 5' 4"  (1.626 m)   Wt 209 lb 6.4 oz (95 kg)   SpO2 97%   BMI 35.94 kg/m   Wt Readings from Last 3 Encounters:  04/04/18 209 lb 6.4 oz (95 kg)  09/14/17 213 lb 11.2 oz (96.9 kg)  05/30/17 216 lb (98 kg)    Physical Exam  Constitutional: She appears well-developed and well-nourished. No distress.  HENT:  Head: Normocephalic and atraumatic.  Eyes: EOM are normal. No scleral icterus.  Neck: No thyromegaly present.  Cardiovascular: Normal rate, regular rhythm and normal heart sounds.  No murmur heard. Pulmonary/Chest: Effort normal and breath sounds normal. No respiratory distress. She has no wheezes.  Abdominal: Soft. Bowel sounds are normal. She exhibits no distension.  Musculoskeletal: She exhibits no edema.  Neurological: She is alert.  Skin: Skin is warm and dry. She is not diaphoretic. No pallor.  Psychiatric: She has a normal mood and affect. Her behavior is normal. Judgment and thought content normal.    Results for orders placed or performed in visit on 06/05/17  Lipid panel  Result Value Ref Range   Cholesterol 215 (H) <200 mg/dL   HDL 48 (L) >50 mg/dL   Triglycerides 233 (H) <150 mg/dL   LDL Cholesterol (Calc) 129 (H) mg/dL (calc)   Total CHOL/HDL Ratio 4.5 <5.0 (calc)   Non-HDL Cholesterol (Calc) 167 (H) <130 mg/dL (calc)  Hepatic function panel  Result Value Ref Range   Total Protein 6.8 6.1 - 8.1 g/dL   Albumin 4.1 3.6 - 5.1 g/dL   Globulin 2.7 1.9 - 3.7 g/dL (calc)   AG Ratio 1.5 1.0 -  2.5 (calc)   Total Bilirubin 0.4 0.2 - 1.2 mg/dL   Bilirubin, Direct 0.1 0.0 - 0.2 mg/dL   Indirect Bilirubin 0.3 0.2 - 1.2 mg/dL (calc)   Alkaline phosphatase (APISO) 77 33 - 130 U/L   AST 15 10 - 35 U/L   ALT 20 6 - 29 U/L  Assessment & Plan:   Problem List Items Addressed This Visit      Cardiovascular and Mediastinum   Essential hypertension, benign - Primary    Fair control today; encouraged weight loss and DASH guidelines      Aortic insufficiency    Managed by cardiologist        Respiratory   Emphysema of lung (HCC)   Relevant Medications   albuterol (PROVENTIL HFA) 108 (90 Base) MCG/ACT inhaler   varenicline (CHANTIX STARTING MONTH PAK) 0.5 MG X 11 & 1 MG X 42 tablet   varenicline (CHANTIX CONTINUING MONTH PAK) 1 MG tablet     Other   Fibromyalgia   Tobacco abuse    So glad to hear that she is interested in quitting smoking Discussed different forms of smoking cessation with the patient, and I encouraged cessation. Patient requests trial of Chantix.  I discussed common side effects including but not limited to nausea/vomiting, vivid dreams, behaviour changes, depression, suicidal ideation; also explained possible increased cardiovascular risk. After discussion, patient opts to try quitting with Chantix. The 1-800-QUIT-NOW number given in patient instructions. Patient was encouraged to choose a quit date about 1 week after starting medicine.       Morbid obesity ([place])    Encouraged weight loss; BMI >35 with high chol, high blood pressure      Moderate episode of recurrent major depressive disorder ([place])    Start back on cymbalta; close f/u      Relevant Medications   DULoxetine (CYMBALTA) 30 MG capsule   DULoxetine (CYMBALTA) 60 MG capsule   Hyperlipidemia    Start back on statin, check labs later on when she has been on this for 6-8 weeks       Other Visit Diagnoses    Influenza vaccine needed       offered and given   Relevant Orders   Flu  Vaccine QUAD 6+ mos PF IM (Fluarix Quad PF) (Completed)   Need for shingles vaccine       offered and given   Relevant Orders   Varicella-zoster vaccine IM (Shingrix) (Completed)       Follow up plan: No follow-ups on file.  An after-visit summary was printed and given to the patient at [place].  Please see the patient instructions which may contain other information and recommendations beyond what is mentioned above in the assessment and plan.  Meds ordered this encounter  Medications  . albuterol (PROVENTIL HFA) 108 (90 Base) MCG/ACT inhaler    Sig: Inhale 2 puffs into the lungs every 4 (four) hours as needed for wheezing or shortness of breath.    Dispense:  3 Inhaler    Refill:  1  . DULoxetine (CYMBALTA) 30 MG capsule    Sig: One by mouth daily for one week, then two a day    Dispense:  53 capsule    Refill:  0  . varenicline (CHANTIX STARTING MONTH PAK) 0.5 MG X 11 & 1 MG X 42 tablet    Sig: Take one 0.5 mg tablet by mouth once daily for 3 days, then increase to one 0.5 mg tablet twice daily for 4 days, then increase to one 1 mg tablet twice daily.    Dispense:  53 tablet    Refill:  0  . varenicline (CHANTIX CONTINUING MONTH PAK) 1 MG tablet    Sig: Take 1 tablet (1 mg total) by mouth 2 (two) times daily. (fill after the starter kit    Dispense:  180 tablet    Refill:  0    Sending to mail order; she is getting starter pack filled locally first; do not fill this for 7 days  . DULoxetine (CYMBALTA) 60 MG capsule    Sig: Take 1 capsule (60 mg total) by mouth daily.    Dispense:  90 capsule    Refill:  2    Patient is filling 30 mg here locally; this will be for September; do NOT fill this for 7 days after today's date    Orders Placed This Encounter  Procedures  . Flu Vaccine QUAD 6+ mos PF IM (Fluarix Quad PF)  . Varicella-zoster vaccine IM (Shingrix)   Face-to-face time with patient was more than 40 minutes, >50% time spent counseling and coordination of  care

## 2018-04-04 NOTE — Patient Instructions (Signed)
Start the Chantix I do encourage you to quit smoking Call [phone] to sign up for smoking cessation classes You can call 1-800-QUIT-NOW to talk with a smoking cessation coach  If you need something for aches or pains, try to use Tylenol (acetaminophen) instead of non-steroidals (which include Aleve, ibuprofen, Advil, Motrin, and naproxen); non-steroidals can cause long-term kidney damage  Start the Cymbalta Keep next appointment

## 2018-04-10 ENCOUNTER — Ambulatory Visit
Admission: RE | Admit: 2018-04-10 | Discharge: 2018-04-10 | Disposition: A | Discharge status: Home / Self Care | Payer: Medicare Other | Source: Ambulatory Visit | Attending: Family Medicine | Admitting: Family Medicine

## 2018-04-10 DIAGNOSIS — R928 Other abnormal and inconclusive findings on diagnostic imaging of breast: Secondary | ICD-10-CM | POA: Diagnosis not present

## 2018-04-10 DIAGNOSIS — N6489 Other specified disorders of breast: Secondary | ICD-10-CM

## 2018-04-14 DIAGNOSIS — F331 Major depressive disorder, recurrent, moderate: Secondary | ICD-10-CM | POA: Insufficient documentation

## 2018-04-14 NOTE — Assessment & Plan Note (Signed)
Fair control today; encouraged weight loss and DASH guidelines

## 2018-04-14 NOTE — Assessment & Plan Note (Addendum)
Encouraged weight loss; BMI >35 with high chol, high blood pressure

## 2018-04-14 NOTE — Assessment & Plan Note (Signed)
Start back on cymbalta; close f/u

## 2018-04-14 NOTE — Assessment & Plan Note (Signed)
Managed by cardiologist 

## 2018-04-14 NOTE — Assessment & Plan Note (Signed)
Start back on statin, check labs later on when she has been on this for 6-8 weeks

## 2018-04-18 ENCOUNTER — Other Ambulatory Visit: Payer: Self-pay | Admitting: Family Medicine

## 2018-04-23 ENCOUNTER — Ambulatory Visit (INDEPENDENT_AMBULATORY_CARE_PROVIDER_SITE_OTHER): Payer: Medicare Other

## 2018-04-23 ENCOUNTER — Other Ambulatory Visit: Payer: Self-pay | Admitting: Family Medicine

## 2018-04-23 VITALS — BP 128/72 | HR 92 | Temp 98.2°F | Ht 64.0 in | Wt 210.8 lb

## 2018-04-23 DIAGNOSIS — H9190 Unspecified hearing loss, unspecified ear: Secondary | ICD-10-CM

## 2018-04-23 DIAGNOSIS — Z599 Problem related to housing and economic circumstances, unspecified: Secondary | ICD-10-CM

## 2018-04-23 DIAGNOSIS — Z0000 Encounter for general adult medical examination without abnormal findings: Secondary | ICD-10-CM

## 2018-04-23 DIAGNOSIS — Z598 Other problems related to housing and economic circumstances: Secondary | ICD-10-CM

## 2018-04-23 NOTE — Patient Instructions (Signed)
[name] , Thank you for taking time to come for your Medicare Wellness Visit. I appreciate your ongoing commitment to your health goals. Please review the following plan we discussed and let me know if I can assist you in the future.   Screening recommendations/referrals: Colorectal Screening: Please complete and return your Cologuard Mammogram: Up to date Bone Density: Not yet required  Vision and Dental Exams: Recommended annual ophthalmology exams for early detection of glaucoma and other disorders of the eye Recommended annual dental exams for proper oral hygiene  Vaccinations: Influenza vaccine: Up to date Pneumococcal vaccine: Not yet required Tdap vaccine: Up to date Shingles vaccine: Please call your insurance company to determine your out of pocket expense for the Shingrix vaccine. You may also receive this vaccine at your local pharmacy or Health Dept.    Advanced directives: Advance directive discussed with you today. I have provided a copy for you to complete at home and have notarized. Once this is complete please bring a copy in to our office so we can scan it into your chart.  Goals: Recommend to drink at least 6-8 8oz glasses of water per day.  Next appointment: Please schedule your Annual Wellness Visit with your Nurse Health Advisor in one year.  Preventive Care 40-64 Years, Female Preventive care refers to lifestyle choices and visits with your health care provider that can promote health and wellness. What does preventive care include?  A yearly physical exam. This is also called an annual well check.  Dental exams once or twice a year.  Routine eye exams. Ask your health care provider how often you should have your eyes checked.  Personal lifestyle choices, including:  Daily care of your teeth and gums.  Regular physical activity.  Eating a healthy diet.  Avoiding tobacco and drug use.  Limiting alcohol use.  Practicing safe sex.  Taking low-dose  aspirin daily starting at age 17.  Taking vitamin and mineral supplements as recommended by your health care provider. What happens during an annual well check? The services and screenings done by your health care provider during your annual well check will depend on your age, overall health, lifestyle risk factors, and family history of disease. Counseling  Your health care provider may ask you questions about your:  Alcohol use.  Tobacco use.  Drug use.  Emotional well-being.  Home and relationship well-being.  Sexual activity.  Eating habits.  Work and work Statistician.  Method of birth control.  Menstrual cycle.  Pregnancy history. Screening  You may have the following tests or measurements:  Height, weight, and BMI.  Blood pressure.  Lipid and cholesterol levels. These may be checked every 5 years, or more frequently if you are over 67 years old.  Skin check.  Lung cancer screening. You may have this screening every year starting at age 88 if you have a 30-pack-year history of smoking and currently smoke or have quit within the past 15 years.  Fecal occult blood test (FOBT) of the stool. You may have this test every year starting at age 49.  Flexible sigmoidoscopy or colonoscopy. You may have a sigmoidoscopy every 5 years or a colonoscopy every 10 years starting at age 57.  Hepatitis C blood test.  Hepatitis B blood test.  Sexually transmitted disease (STD) testing.  Diabetes screening. This is done by checking your blood sugar (glucose) after you have not eaten for a while (fasting). You may have this done every 1-3 years.  Mammogram. This may  be done every 1-2 years. Talk to your health care provider about when you should start having regular mammograms. This may depend on whether you have a family history of breast cancer.  BRCA-related cancer screening. This may be done if you have a family history of breast, ovarian, tubal, or peritoneal  cancers.  Pelvic exam and Pap test. This may be done every 3 years starting at age 57. Starting at age 81, this may be done every 5 years if you have a Pap test in combination with an HPV test.  Bone density scan. This is done to screen for osteoporosis. You may have this scan if you are at high risk for osteoporosis. Discuss your test results, treatment options, and if necessary, the need for more tests with your health care provider. Vaccines  Your health care provider may recommend certain vaccines, such as:  Influenza vaccine. This is recommended every year.  Tetanus, diphtheria, and acellular pertussis (Tdap, Td) vaccine. You may need a Td booster every 10 years.  Zoster vaccine. You may need this after age 76.  Pneumococcal 13-valent conjugate (PCV13) vaccine. You may need this if you have certain conditions and were not previously vaccinated.  Pneumococcal polysaccharide (PPSV23) vaccine. You may need one or two doses if you smoke cigarettes or if you have certain conditions. Talk to your health care provider about which screenings and vaccines you need and how often you need them. This information is not intended to replace advice given to you by your health care provider. Make sure you discuss any questions you have with your health care provider. Document Released: 09/03/2015 Document Revised: 04/26/2016 Document Reviewed: 06/08/2015 Elsevier Interactive Patient Education  2017 [place] Prevention in the Home Falls can cause injuries. They can happen to people of all ages. There are many things you can do to make your home safe and to help prevent falls. What can I do on the outside of my home?  Regularly fix the edges of walkways and driveways and fix any cracks.  Remove anything that might make you trip as you walk through a door, such as a raised step or threshold.  Trim any bushes or trees on the path to your home.  Use bright outdoor lighting.  Clear  any walking paths of anything that might make someone trip, such as rocks or tools.  Regularly check to see if handrails are loose or broken. Make sure that both sides of any steps have handrails.  Any raised decks and porches should have guardrails on the edges.  Have any leaves, snow, or ice cleared regularly.  Use sand or salt on walking paths during winter.  Clean up any spills in your garage right away. This includes oil or grease spills. What can I do in the bathroom?  Use night lights.  Install grab bars by the toilet and in the tub and shower. Do not use towel bars as grab bars.  Use non-skid mats or decals in the tub or shower.  If you need to sit down in the shower, use a plastic, non-slip stool.  Keep the floor dry. Clean up any water that spills on the floor as soon as it happens.  Remove soap buildup in the tub or shower regularly.  Attach bath mats securely with double-sided non-slip rug tape.  Do not have throw rugs and other things on the floor that can make you trip. What can I do in the bedroom?  Use night  lights.  Make sure that you have a light by your bed that is easy to reach.  Do not use any sheets or blankets that are too big for your bed. They should not hang down onto the floor.  Have a firm chair that has side arms. You can use this for support while you get dressed.  Do not have throw rugs and other things on the floor that can make you trip. What can I do in the kitchen?  Clean up any spills right away.  Avoid walking on wet floors.  Keep items that you use a lot in easy-to-reach places.  If you need to reach something above you, use a strong step stool that has a grab bar.  Keep electrical cords out of the way.  Do not use floor polish or wax that makes floors slippery. If you must use wax, use non-skid floor wax.  Do not have throw rugs and other things on the floor that can make you trip. What can I do with my stairs?  Do not  leave any items on the stairs.  Make sure that there are handrails on both sides of the stairs and use them. Fix handrails that are broken or loose. Make sure that handrails are as long as the stairways.  Check any carpeting to make sure that it is firmly attached to the stairs. Fix any carpet that is loose or worn.  Avoid having throw rugs at the top or bottom of the stairs. If you do have throw rugs, attach them to the floor with carpet tape.  Make sure that you have a light switch at the top of the stairs and the bottom of the stairs. If you do not have them, ask someone to add them for you. What else can I do to help prevent falls?  Wear shoes that:  Do not have high heels.  Have rubber bottoms.  Are comfortable and fit you well.  Are closed at the toe. Do not wear sandals.  If you use a stepladder:  Make sure that it is fully opened. Do not climb a closed stepladder.  Make sure that both sides of the stepladder are locked into place.  Ask someone to hold it for you, if possible.  Clearly mark and make sure that you can see:  Any grab bars or handrails.  First and last steps.  Where the edge of each step is.  Use tools that help you move around (mobility aids) if they are needed. These include:  Canes.  Walkers.  Scooters.  Crutches.  Turn on the lights when you go into a dark area. Replace any light bulbs as soon as they burn out.  Set up your furniture so you have a clear path. Avoid moving your furniture around.  If any of your floors are uneven, fix them.  If there are any pets around you, be aware of where they are.  Review your medicines with your doctor. Some medicines can make you feel dizzy. This can increase your chance of falling. Ask your doctor what other things that you can do to help prevent falls. This information is not intended to replace advice given to you by your health care provider. Make sure you discuss any questions you have with  your health care provider. Document Released: 06/03/2009 Document Revised: 01/13/2016 Document Reviewed: 09/11/2014 Elsevier Interactive Patient Education  2017 [employer].

## 2018-04-23 NOTE — Progress Notes (Signed)
Subjective:   Jenna Meyers is a 62 y.o. female who presents for an Initial Medicare Annual Wellness Visit.  Review of Systems    N/A  Cardiac Risk Factors include: dyslipidemia;hypertension;obesity (BMI >30kg/m2);sedentary lifestyle;smoking/ tobacco exposure     Objective:    Today's Vitals   04/23/18 1340  BP: 128/72  Pulse: 92  Temp: 98.2 F (36.8 C)  TempSrc: Oral  SpO2: 95%  Weight: 210 lb 12.8 oz (95.6 kg)  Height: 5' 4"  (1.626 m)   Body mass index is 36.18 kg/m.  Advanced Directives 04/23/2018 05/30/2017 05/30/2017 04/19/2017 04/19/2017 02/15/2017 01/01/2017  Does Patient Have a Medical Advance Directive? No No No No No No No  Would patient like information on creating a medical advance directive? Yes (MAU/Ambulatory/Procedural Areas - Information given) Yes (Inpatient - patient requests chaplain consult to create a medical advance directive) - Yes (ED - Information included in AVS) - No - Patient declined No - Patient declined    Current Medications (verified) Outpatient Encounter Medications as of 04/23/2018  Medication Sig  . aspirin EC 81 MG tablet Take 1 tablet (81 mg total) by mouth daily. Take at least one hour before naproxen  . atorvastatin (LIPITOR) 40 MG tablet Take 1 tablet (40 mg total) by mouth at bedtime.  . DULoxetine (CYMBALTA) 30 MG capsule One by mouth daily for one week, then two a day  . DULoxetine (CYMBALTA) 60 MG capsule Take 1 capsule (60 mg total) by mouth daily.  Marland Kitchen lisinopril (PRINIVIL,ZESTRIL) 10 MG tablet Take 10 mg by mouth daily.  . metoprolol succinate (TOPROL-XL) 25 MG 24 hr tablet Take 1 tablet (25 mg total) by mouth daily. For blood pressure  . Turmeric Curcumin 500 MG CAPS Take 500 mg by mouth 2 (two) times daily.   . varenicline (CHANTIX CONTINUING MONTH PAK) 1 MG tablet Take 1 tablet (1 mg total) by mouth 2 (two) times daily. (fill after the starter kit  . varenicline (CHANTIX STARTING MONTH PAK) 0.5 MG X 11 & 1 MG X 42 tablet Take  one 0.5 mg tablet by mouth once daily for 3 days, then increase to one 0.5 mg tablet twice daily for 4 days, then increase to one 1 mg tablet twice daily.  Marland Kitchen albuterol (PROVENTIL HFA) 108 (90 Base) MCG/ACT inhaler Inhale 2 puffs into the lungs every 4 (four) hours as needed for wheezing or shortness of breath. (Patient not taking: Reported on 04/23/2018)   No facility-administered encounter medications on file as of 04/23/2018.     Allergies (verified) Patient has no known allergies.   History: Past Medical History:  Diagnosis Date  . Anxiety 10/09/2012  . Aortic insufficiency 03/19/2018   ECHO July 2019  . Arthritis    back  . Depression   . Emphysema lung ([place])   . Fibromyalgia   . Grade I diastolic dysfunction 04/07/5630   ECHO July 2019  . Hyperlipidemia   . Lumbago   . Lumbar canal stenosis 09/14/2017   Moderate to severe, MRI Nov 2018  . LVH (left ventricular hypertrophy) 03/19/2018   ECHO July 2019  . Mitral regurgitation 03/19/2018   ECHO July 2019  . Tobacco abuse   . Vitamin D deficiency disease    Past Surgical History:  Procedure Laterality Date  . ABDOMINAL HYSTERECTOMY  2000   partial, still has cervix  . CESAREAN SECTION  1996  . LAPAROSCOPIC ABDOMINAL EXPLORATION  1999  . TONSILLECTOMY AND ADENOIDECTOMY  1962   Family History  Problem Relation  Age of Onset  . Cancer Mother        colon  . Osteoporosis Mother   . Hyperlipidemia Mother   . Alzheimer's disease Father   . Thyroid disease Son   . Heart disease Brother    Social History   Socioeconomic History  . Marital status: Divorced    Spouse name: Not on file  . Number of children: 1  . Years of education: Not on file  . Highest education level: Associate degree: academic program  Occupational History  . Occupation: Disabled  Social Needs  . Financial resource strain: Somewhat hard  . Food insecurity:    Worry: Never true    Inability: Never true  . Transportation needs:    Medical: No     Non-medical: No  Tobacco Use  . Smoking status: Current Every Day Smoker    Packs/day: 1.00    Years: 45.00    Pack years: 45.00    Types: Cigarettes  . Smokeless tobacco: Never Used  . Tobacco comment: been given a Rx for Chantix  Substance and Sexual Activity  . Alcohol use: No  . Drug use: No  . Sexual activity: Never  Lifestyle  . Physical activity:    Days per week: 0 days    Minutes per session: 0 min  . Stress: To some extent  Relationships  . Social connections:    Talks on phone: Patient refused    Gets together: Patient refused    Attends religious service: Patient refused    Active member of club or organization: Patient refused    Attends meetings of clubs or organizations: Patient refused    Relationship status: Divorced  Other Topics Concern  . Not on file  Social History Narrative  . Not on file    Tobacco Counseling Ready to quit: Yes Counseling given: Yes Comment: been given a Rx for Chantix  Clinical Intake:  Pre-visit preparation completed: Yes  Pain : No/denies pain   BMI - recorded: 36.18 Nutritional Status: BMI > 30  Obese Nutritional Risks: None Diabetes: No  How often do you need to have someone help you when you read instructions, pamphlets, or other written materials from your doctor or pharmacy?: 1 - Never  Interpreter Needed?: No  Information entered by :: AEversole, LPN   Activities of Daily Living In your present state of health, do you have any difficulty performing the following activities: 04/23/2018 04/04/2018  Hearing? N Y  Comment denies hearing aids -  Vision? N Y  Comment wears eyeglasses -  Difficulty concentrating or making decisions? N N  Walking or climbing stairs? Y N  Comment fatigue -  Dressing or bathing? N N  Doing errands, shopping? N N  Preparing Food and eating ? N -  Comment denies dentures -  Using the Toilet? N -  In the past six months, have you accidently leaked urine? N -  Do you have problems  with loss of bowel control? N -  Managing your Medications? N -  Managing your Finances? N -  Housekeeping or managing your Housekeeping? N -  Some recent data might be hidden     Immunizations and Health Maintenance Immunization History  Administered Date(s) Administered  . Influenza Inj Mdck Quad Pf 06/14/2017  . Influenza,inj,Quad PF,6+ Mos 04/04/2018  . Tdap 09/03/2015  . Zoster 08/21/2013  . Zoster Recombinat (Shingrix) 04/04/2018   Health Maintenance Due  Topic Date Due  . Fecal DNA (Cologuard)  06/28/2007  .  PAP SMEAR  11/30/2016    Patient Care Team: Arnetha Courser, MD as PCP - General (Family Medicine) Dionisio David, MD as Consulting Physician (Cardiology)  Indicate any recent Medical Services you may have received from other than [facility] providers in the past year (date may be approximate).     Assessment:   This is a routine wellness examination for [facility].  Hearing/Vision screen Vision Screening Comments: Not established with a provider for annual eye exams. Pt states she is scheduled to be seen by [facility] care Oct 2019  Dietary issues and exercise activities discussed: Current Exercise Habits: The patient does not participate in regular exercise at present, Exercise limited by: None identified  Goals    . DIET - INCREASE WATER INTAKE     Recommend to drink at least 6-8 8oz glasses of water per day.      Depression Screen PHQ 2/9 Scores 04/23/2018 04/04/2018 09/14/2017 02/09/2016  PHQ - 2 Score 2 2 0 0  PHQ- 9 Score 6 6 - -    Fall Risk Fall Risk  04/23/2018 04/04/2018 09/14/2017 02/09/2016 07/22/2015  Falls in the past year? No No No Yes No  Number falls in past yr: - - - 1 -  Injury with Fall? - - - No -  Risk for fall due to : Impaired vision;Medication side effect - - - -  Risk for fall due to: Comment wears eyeglasses - - - -    FALL RISK PREVENTION PERTAINING TO HOME: Is your home free of loose throw rugs in walkways, pet beds, electrical cords,  etc? Yes Is there adequate lighting in your home to reduce risk of falls?  Yes Are there stairs in or around your home WITH handrails? No stairs  ASSISTIVE DEVICES UTILIZED TO PREVENT FALLS: Use of a cane, walker or w/c? No Grab bars in the bathroom? Yes  Shower chair or a place to sit while bathing? No An elevated toilet seat or a handicapped toilet? No  Timed Get Up and Go Performed: Yes. Pt ambulated 10 feet within 11 sec. Gait stead-fast and without the use of an assistive device. No intervention required at this time. Fall risk prevention has been discussed.  Community Resource Referral:  [employer] Referral not required at this time.   OR Community Resource Referral sent to Care Guide for installation of grab bars in the shower, shower chair or an elevated toilet seat.   OR Pt declined my offer to send Community Resource Referral to Care Guide for a shower chair or an elevated toilet seat.  Cognitive Function:     6CIT Screen 04/23/2018  What Year? 0 points  What month? 0 points  What time? 0 points  Count back from 20 0 points  Months in reverse 0 points  Repeat phrase 0 points  Total Score 0    Screening Tests Health Maintenance  Topic Date Due  . Fecal DNA (Cologuard)  06/28/2007  . PAP SMEAR  11/30/2016  . HIV Screening  09/14/2018 (Originally 06/27/1971)  . MAMMOGRAM  03/15/2019  . TETANUS/TDAP  09/02/2025  . INFLUENZA VACCINE  Completed  . Hepatitis C Screening  Completed    Qualifies for Shingles Vaccine? No  Cancer Screenings: Lung: Low Dose CT Chest recommended if Age 1-80 years, 30 pack-year currently smoking OR have quit w/in 15years. Patient does not qualify. Breast: Up to date on Mammogram? Yes. Completed 03/14/18. Repeat every year  Bone Density/Dexa: Not yet required Colorectal: Cologuard ordered 09/11/17.  Pt states she has her package but has not completed nor returned her specimen for processing. Education provided re: importance in  completing this screening.  Additional Screenings: Hepatitis C Screening: Completed 09/03/15   Plan:  I have personally reviewed and addressed the Medicare Annual Wellness questionnaire and have noted the following in the patient's chart:  A. Medical and social history B. Use of alcohol, tobacco or illicit drugs  C. Current medications and supplements D. Functional ability and status E.  Nutritional status F.  Physical activity G. Advance directives H. List of other physicians I.  Hospitalizations, surgeries, and ER visits in previous 12 months J.  [place] such as hearing and vision if needed, cognitive and depression L. Referrals and appointments  In addition, I have reviewed and discussed with patient certain preventive protocols, quality metrics, and best practice recommendations. A written personalized care plan for preventive services as well as general preventive health recommendations were provided to patient.  See attached scanned questionnaire for additional information.   Signed,  Aleatha Borer, LPN Nurse Health Advisor

## 2018-04-29 ENCOUNTER — Encounter: Payer: Self-pay | Admitting: Family Medicine

## 2018-04-29 ENCOUNTER — Ambulatory Visit (INDEPENDENT_AMBULATORY_CARE_PROVIDER_SITE_OTHER): Payer: Medicare Other | Admitting: Family Medicine

## 2018-04-29 ENCOUNTER — Other Ambulatory Visit (HOSPITAL_COMMUNITY)
Admission: RE | Admit: 2018-04-29 | Discharge: 2018-04-29 | Disposition: A | Discharge status: Home / Self Care | Payer: Medicare Other | Source: Ambulatory Visit | Attending: Family Medicine | Admitting: Family Medicine

## 2018-04-29 VITALS — BP 122/62 | HR 98 | Temp 98.0°F | Ht 64.25 in | Wt 208.9 lb

## 2018-04-29 DIAGNOSIS — M2550 Pain in unspecified joint: Secondary | ICD-10-CM | POA: Diagnosis not present

## 2018-04-29 DIAGNOSIS — Z0000 Encounter for general adult medical examination without abnormal findings: Secondary | ICD-10-CM | POA: Diagnosis not present

## 2018-04-29 DIAGNOSIS — Z124 Encounter for screening for malignant neoplasm of cervix: Secondary | ICD-10-CM | POA: Diagnosis not present

## 2018-04-29 NOTE — Assessment & Plan Note (Signed)
Encouragement given on weight loss; refer to nutritionist

## 2018-04-29 NOTE — Progress Notes (Signed)
Patient ID: Jenna Meyers, female   DOB: 1955/10/05, 62 y.o.   MRN: [id number]   Subjective:   Jenna Meyers is a 62 y.o. female here for a complete physical exam  Interim issues since last visit: no medical excitement  She would like to be referred to a rheumatologist; she has aching joints in the hands, shoulders, both hips, lower back; sciatica  USPSTF grade A and B recommendations Depression:  Depression screen [facility] 2/9 04/29/2018 04/23/2018 04/04/2018 09/14/2017 02/09/2016  Decreased Interest 0 1 1 0 0  Down, Depressed, Hopeless _0 0 0  PHQ - 2 Score _1 0 0  Altered sleeping _2 - -  Tired, decreased energy _3 - -  Change in appetite 0 1 0 - -  Feeling bad or failure about yourself  0 1 1 - -  Trouble concentrating 0 0 1 - -  Moving slowly or fidgety/restless 0 0 0 - -  Suicidal thoughts 0 0 0 - -  PHQ-9 Score _4 - -  Difficult doing work/chores Not difficult at all Somewhat difficult Somewhat difficult - -   Hypertension: BP Readings from Last 3 Encounters:  04/29/18 122/62  04/23/18 128/72  04/04/18 126/84   Obesity: Wt Readings from Last 3 Encounters:  04/29/18 208 lb 14.4 oz (94.8 kg)  04/23/18 210 lb 12.8 oz (95.6 kg)  04/04/18 209 lb 6.4 oz (95 kg)   BMI Readings from Last 3 Encounters:  04/29/18 35.58 kg/m  04/23/18 36.18 kg/m  04/04/18 35.94 kg/m    Skin cancer: will check back today; brother had precancerous moles removed; just some darker color right shoulder  Lung cancer:  Just started Chantix, one cig this morning, feeling fine without any others today Breast cancer: just had extra imaging; reminder put in for Feb imaging Colorectal cancer: will do cologuard Cervical cancer screening: today; no hx of abn paps BRCA gene screening: family hx of breast and/or ovarian cancer and/or metastatic prostate cancer? None known, her parents were the youngest of large families HIV, hep B, hep C:   STD testing and prevention (chl/gon/syphilis):   Intimate partner violence: no abuse Contraception: n/a Osteoporosis: DEXA was many years ago, normal; start at age 71 Fall prevention/vitamin D: discussed Immunizations: UTD Diet: good eater, loves fruits and veggies Exercise: can't do gardening with her back; does walk Alcohol: occasional   Office Visit from 04/29/2018 in [facility]  AUDIT-C Score  0     Tobacco use: trying to quit AAA: n/a Aspirin: recommended; saw cardiologist The 10-year ASCVD risk score Mikey Bussing DC Jr., et al., 2013) is: 13.3%   Values used to calculate the score:     Age: 71 years     Sex: Female     Is Non-Hispanic African American: No     Diabetic: No     Tobacco smoker: Yes     Systolic Blood Pressure: 591 mmHg     Is BP treated: Yes     HDL Cholesterol: 41 mg/dL     Total Cholesterol: 287 mg/dL  Glucose:  Glucose  Date Value Ref Range Status  04/17/2014 114 (H) 65 - 99 mg/dL Final  10/24/2013 87 65 - 99 mg/dL Final  10/20/2013 86 65 - 99 mg/dL Final   Glucose, Bld  Date Value Ref Range Status  04/29/2018 102 65 - 139 mg/dL Final    Comment:    .        Non-fasting  reference interval .   05/30/2017 88 65 - 99 mg/dL Final  06/28/2016 116 (H) 65 - 99 mg/dL Final   Lipids: last labs were nonfasting Lab Results  Component Value Date   CHOL 287 (H) 04/29/2018   CHOL 215 (H) 09/14/2017   CHOL 182 02/09/2016   Lab Results  Component Value Date   HDL 41 (L) 04/29/2018   HDL 48 (L) 09/14/2017   HDL 50 02/09/2016   Lab Results  Component Value Date   LDLCALC 195 (H) 04/29/2018   LDLCALC 129 (H) 09/14/2017   LDLCALC 109 02/09/2016   Lab Results  Component Value Date   TRIG 287 (H) 04/29/2018   TRIG 233 (H) 09/14/2017   TRIG 113 02/09/2016   Lab Results  Component Value Date   CHOLHDL 7.0 (H) 04/29/2018   CHOLHDL 4.5 09/14/2017   CHOLHDL 3.6 02/09/2016   No results found for: LDLDIRECT   Past Medical History:  Diagnosis Date  . Anxiety 10/09/2012  .  Aortic insufficiency 03/19/2018   ECHO July 2019  . Arthritis    back  . Depression   . Emphysema lung ([place])   . Fibromyalgia   . Grade I diastolic dysfunction 11/21/4740   ECHO July 2019  . Hyperlipidemia   . Lumbago   . Lumbar canal stenosis 09/14/2017   Moderate to severe, MRI Nov 2018  . LVH (left ventricular hypertrophy) 03/19/2018   ECHO July 2019  . Mitral regurgitation 03/19/2018   ECHO July 2019  . Tobacco abuse   . Vitamin D deficiency disease    Past Surgical History:  Procedure Laterality Date  . ABDOMINAL HYSTERECTOMY  2000   partial, still has cervix  . CESAREAN SECTION  1996  . LAPAROSCOPIC ABDOMINAL EXPLORATION  1999  . TONSILLECTOMY AND ADENOIDECTOMY  1962   Family History  Problem Relation Age of Onset  . Cancer Mother        colon  . Osteoporosis Mother   . Hyperlipidemia Mother   . Alzheimer's disease Father   . Thyroid disease Son   . Heart disease Brother    Social History   Tobacco Use  . Smoking status: Current Every Day Smoker    Packs/day: 1.00    Years: 45.00    Pack years: 45.00    Types: Cigarettes  . Smokeless tobacco: Never Used  . Tobacco comment: been given a Rx for Chantix  Substance Use Topics  . Alcohol use: No  . Drug use: No   Review of Systems  Objective:   Vitals:   04/29/18 1526  BP: 122/62  Pulse: 98  Temp: 98 F (36.7 C)  TempSrc: Oral  SpO2: 99%  Weight: 208 lb 14.4 oz (94.8 kg)  Height: 5' 4.25" (1.632 m)   Body mass index is 35.58 kg/m. Wt Readings from Last 3 Encounters:  04/29/18 208 lb 14.4 oz (94.8 kg)  04/23/18 210 lb 12.8 oz (95.6 kg)  04/04/18 209 lb 6.4 oz (95 kg)   Physical Exam  Constitutional: She appears well-developed and well-nourished.  HENT:  Head: Normocephalic and atraumatic.  Eyes: Conjunctivae and EOM are normal. Right eye exhibits no hordeolum. Left eye exhibits no hordeolum. No scleral icterus.  Neck: Carotid bruit is not present. No thyromegaly present.  Cardiovascular:  Normal rate, regular rhythm, S1 normal, S2 normal and normal heart sounds.  No extrasystoles are present.  Pulmonary/Chest: Effort normal and breath sounds normal. No respiratory distress. Right breast exhibits no inverted nipple, no mass, no nipple  discharge, no skin change and no tenderness. Left breast exhibits no inverted nipple, no mass, no nipple discharge, no skin change and no tenderness. Breasts are symmetrical.  Abdominal: Soft. Normal appearance and bowel sounds are normal. She exhibits no distension, no abdominal bruit, no pulsatile midline mass and no mass. There is no hepatosplenomegaly. There is no tenderness. No hernia.  Genitourinary: Uterus normal. Pelvic exam was performed with patient prone. There is no rash or lesion on the right labia. There is no rash or lesion on the left labia. Cervix exhibits no motion tenderness. Right adnexum displays no mass, no tenderness and no fullness. Left adnexum displays no mass, no tenderness and no fullness.  Musculoskeletal: Normal range of motion. She exhibits no edema.  Lymphadenopathy:       Head (right side): No submandibular adenopathy present.       Head (left side): No submandibular adenopathy present.    She has no cervical adenopathy.    She has no axillary adenopathy.  Neurological: She is alert. She displays no tremor. No cranial nerve deficit. She exhibits normal muscle tone. Gait normal.  Skin: Skin is warm and dry. No bruising and no ecchymosis noted. No cyanosis. No pallor.  Psychiatric: Her speech is normal and behavior is normal. Thought content normal. Her mood appears not anxious. She does not exhibit a depressed mood.    Assessment/Plan:   Problem List Items Addressed This Visit      Other   Morbid obesity ([place])    Encouragement given on weight loss; refer to nutritionist      Relevant Orders   Amb ref to Medical Nutrition Therapy-MNT   Preventative health care - Primary    USPSTF grade A and B recommendations  reviewed with patient; age-appropriate recommendations, preventive care, screening tests, etc discussed and encouraged; healthy living encouraged; see AVS for patient education given to patient       Relevant Orders   CBC with Differential/Platelet (Completed)   COMPLETE METABOLIC PANEL WITH GFR (Completed)   Lipid panel (Completed)   TSH (Completed)    Other Visit Diagnoses    Cervical cancer screening       Relevant Orders   Cytology - PAP (Completed)   Arthralgia, unspecified joint       Relevant Orders   Ambulatory referral to Rheumatology       No orders of the defined types were placed in this encounter.  Orders Placed This Encounter  Procedures  . CBC with Differential/Platelet  . COMPLETE METABOLIC PANEL WITH GFR  . Lipid panel  . TSH  . Amb ref to Medical Nutrition Therapy-MNT    Referral Priority:   Routine    Referral Type:   Consultation    Referral Reason:   Specialty Services Required    Requested Specialty:   Nutrition    Number of Visits Requested:   1  . Ambulatory referral to Rheumatology    Referral Priority:   Routine    Referral Type:   Consultation    Referral Reason:   Specialty Services Required    Requested Specialty:   Rheumatology    Number of Visits Requested:   1    Follow up plan: Return in about 1 year (around 04/30/2019) for complete physical.  An After Visit Summary was printed and given to the patient.

## 2018-04-29 NOTE — Patient Instructions (Addendum)
Continue the daily aspirin  Health Maintenance, Female Adopting a healthy lifestyle and getting preventive care can go a long way to promote health and wellness. Talk with your health care provider about what schedule of regular examinations is right for you. This is a good chance for you to check in with your provider about disease prevention and staying healthy. In between checkups, there are plenty of things you can do on your own. Experts have done a lot of research about which lifestyle changes and preventive measures are most likely to keep you healthy. Ask your health care provider for more information. Weight and diet Eat a healthy diet  Be sure to include plenty of vegetables, fruits, low-fat dairy products, and lean protein.  Do not eat a lot of foods high in solid fats, added sugars, or salt.  Get regular exercise. This is one of the most important things you can do for your health. ? Most adults should exercise for at least 150 minutes each week. The exercise should increase your heart rate and make you sweat (moderate-intensity exercise). ? Most adults should also do strengthening exercises at least twice a week. This is in addition to the moderate-intensity exercise.  Maintain a healthy weight  Body mass index (BMI) is a measurement that can be used to identify possible weight problems. It estimates body fat based on height and weight. Your health care provider can help determine your BMI and help you achieve or maintain a healthy weight.  For females 66 years of age and older: ? A BMI below 18.5 is considered underweight. ? A BMI of 18.5 to 24.9 is normal. ? A BMI of 25 to 29.9 is considered overweight. ? A BMI of 30 and above is considered obese.  Watch levels of cholesterol and blood lipids  You should start having your blood tested for lipids and cholesterol at 61 years of age, then have this test every 5 years.  You may need to have your cholesterol levels checked  more often if: ? Your lipid or cholesterol levels are high. ? You are older than 61 years of age. ? You are at high risk for heart disease.  Cancer screening Lung Cancer  Lung cancer screening is recommended for adults 11-67 years old who are at high risk for lung cancer because of a history of smoking.  A yearly low-dose CT scan of the lungs is recommended for people who: ? Currently smoke. ? Have quit within the past 15 years. ? Have at least a 30-pack-year history of smoking. A pack year is smoking an average of one pack of cigarettes a day for 1 year.  Yearly screening should continue until it has been 15 years since you quit.  Yearly screening should stop if you develop a health problem that would prevent you from having lung cancer treatment.  Breast Cancer  Practice breast self-awareness. This means understanding how your breasts normally appear and feel.  It also means doing regular breast self-exams. Let your health care provider know about any changes, no matter how small.  If you are in your 20s or 30s, you should have a clinical breast exam (CBE) by a health care provider every 1-3 years as part of a regular health exam.  If you are 42 or older, have a CBE every year. Also consider having a breast X-ray (mammogram) every year.  If you have a family history of breast cancer, talk to your health care provider about genetic screening.  If  you are at high risk for breast cancer, talk to your health care provider about having an MRI and a mammogram every year.  Breast cancer gene (BRCA) assessment is recommended for women who have family members with BRCA-related cancers. BRCA-related cancers include: ? Breast. ? Ovarian. ? Tubal. ? Peritoneal cancers.  Results of the assessment will determine the need for genetic counseling and BRCA1 and BRCA2 testing.  Cervical Cancer Your health care provider may recommend that you be screened regularly for cancer of the pelvic  organs (ovaries, uterus, and vagina). This screening involves a pelvic examination, including checking for microscopic changes to the surface of your cervix (Pap test). You may be encouraged to have this screening done every 3 years, beginning at age 33.  For women ages 75-65, health care providers may recommend pelvic exams and Pap testing every 3 years, or they may recommend the Pap and pelvic exam, combined with testing for human papilloma virus (HPV), every 5 years. Some types of HPV increase your risk of cervical cancer. Testing for HPV may also be done on women of any age with unclear Pap test results.  Other health care providers may not recommend any screening for nonpregnant women who are considered low risk for pelvic cancer and who do not have symptoms. Ask your health care provider if a screening pelvic exam is right for you.  If you have had past treatment for cervical cancer or a condition that could lead to cancer, you need Pap tests and screening for cancer for at least 20 years after your treatment. If Pap tests have been discontinued, your risk factors (such as having a new sexual partner) need to be reassessed to determine if screening should resume. Some women have medical problems that increase the chance of getting cervical cancer. In these cases, your health care provider may recommend more frequent screening and Pap tests.  Colorectal Cancer  This type of cancer can be detected and often prevented.  Routine colorectal cancer screening usually begins at 61 years of age and continues through 61 years of age.  Your health care provider may recommend screening at an earlier age if you have risk factors for colon cancer.  Your health care provider may also recommend using home test kits to check for hidden blood in the stool.  A small camera at the end of a tube can be used to examine your colon directly (sigmoidoscopy or colonoscopy). This is done to check for the earliest forms  of colorectal cancer.  Routine screening usually begins at age 55.  Direct examination of the colon should be repeated every 5-10 years through 61 years of age. However, you may need to be screened more often if early forms of precancerous polyps or small growths are found.  Skin Cancer  Check your skin from head to toe regularly.  Tell your health care provider about any new moles or changes in moles, especially if there is a change in a mole's shape or color.  Also tell your health care provider if you have a mole that is larger than the size of a pencil eraser.  Always use sunscreen. Apply sunscreen liberally and repeatedly throughout the day.  Protect yourself by wearing long sleeves, pants, a wide-brimmed hat, and sunglasses whenever you are outside.  Heart disease, diabetes, and high blood pressure  High blood pressure causes heart disease and increases the risk of stroke. High blood pressure is more likely to develop in: ? People who have blood pressure  in the high end of the normal range (130-139/85-89 mm Hg). ? People who are overweight or obese. ? People who are African American.  If you are 28-40 years of age, have your blood pressure checked every 3-5 years. If you are 66 years of age or older, have your blood pressure checked every year. You should have your blood pressure measured twice-once when you are at a hospital or clinic, and once when you are not at a hospital or clinic. Record the average of the two measurements. To check your blood pressure when you are not at a hospital or clinic, you can use: ? An automated blood pressure machine at a pharmacy. ? A home blood pressure monitor.  If you are between 22 years and 24 years old, ask your health care provider if you should take aspirin to prevent strokes.  Have regular diabetes screenings. This involves taking a blood sample to check your fasting blood sugar level. ? If you are at a normal weight and have a low risk  for diabetes, have this test once every three years after 61 years of age. ? If you are overweight and have a high risk for diabetes, consider being tested at a younger age or more often. Preventing infection Hepatitis B  If you have a higher risk for hepatitis B, you should be screened for this virus. You are considered at high risk for hepatitis B if: ? You were born in a country where hepatitis B is common. Ask your health care provider which countries are considered high risk. ? Your parents were born in a high-risk country, and you have not been immunized against hepatitis B (hepatitis B vaccine). ? You have HIV or AIDS. ? You use needles to inject street drugs. ? You live with someone who has hepatitis B. ? You have had sex with someone who has hepatitis B. ? You get hemodialysis treatment. ? You take certain medicines for conditions, including cancer, organ transplantation, and autoimmune conditions.  Hepatitis C  Blood testing is recommended for: ? Everyone born from [birth year] through 1964. ? Anyone with known risk factors for hepatitis C.  Sexually transmitted infections (STIs)  You should be screened for sexually transmitted infections (STIs) including gonorrhea and chlamydia if: ? You are sexually active and are younger than 61 years of age. ? You are older than 61 years of age and your health care provider tells you that you are at risk for this type of infection. ? Your sexual activity has changed since you were last screened and you are at an increased risk for chlamydia or gonorrhea. Ask your health care provider if you are at risk.  If you do not have HIV, but are at risk, it may be recommended that you take a prescription medicine daily to prevent HIV infection. This is called pre-exposure prophylaxis (PrEP). You are considered at risk if: ? You are sexually active and do not regularly use condoms or know the HIV status of your partner(s). ? You take drugs by  injection. ? You are sexually active with a partner who has HIV.  Talk with your health care provider about whether you are at high risk of being infected with HIV. If you choose to begin PrEP, you should first be tested for HIV. You should then be tested every 3 months for as long as you are taking PrEP. Pregnancy  If you are premenopausal and you may become pregnant, ask your health care provider about preconception counseling.  If you may become pregnant, take 400 to 800 micrograms (mcg) of folic acid every day.  If you want to prevent pregnancy, talk to your health care provider about birth control (contraception). Osteoporosis and menopause  Osteoporosis is a disease in which the bones lose minerals and strength with aging. This can result in serious bone fractures. Your risk for osteoporosis can be identified using a bone density scan.  If you are 17 years of age or older, or if you are at risk for osteoporosis and fractures, ask your health care provider if you should be screened.  Ask your health care provider whether you should take a calcium or vitamin D supplement to lower your risk for osteoporosis.  Menopause may have certain physical symptoms and risks.  Hormone replacement therapy may reduce some of these symptoms and risks. Talk to your health care provider about whether hormone replacement therapy is right for you. Follow these instructions at home:  Schedule regular health, dental, and eye exams.  Stay current with your immunizations.  Do not use any tobacco products including cigarettes, chewing tobacco, or electronic cigarettes.  If you are pregnant, do not drink alcohol.  If you are breastfeeding, limit how much and how often you drink alcohol.  Limit alcohol intake to no more than 1 drink per day for nonpregnant women. One drink equals 12 ounces of beer, 5 ounces of wine, or 1 ounces of hard liquor.  Do not use street drugs.  Do not share needles.  Ask  your health care provider for help if you need support or information about quitting drugs.  Tell your health care provider if you often feel depressed.  Tell your health care provider if you have ever been abused or do not feel safe at home. This information is not intended to replace advice given to you by your health care provider. Make sure you discuss any questions you have with your health care provider. Document Released: 02/20/2011 Document Revised: 01/13/2016 Document Reviewed: 05/11/2015 Elsevier Interactive Patient Education  [employer].

## 2018-04-30 LAB — CBC WITH DIFFERENTIAL/PLATELET
BASOS ABS: 74 {cells}/uL (ref 0–200)
BASOS PCT: 0.8 %
EOS ABS: 147 {cells}/uL (ref 15–500)
Eosinophils Relative: 1.6 %
HCT: 41.6 % (ref 35.0–45.0)
HEMOGLOBIN: 14 g/dL (ref 11.7–15.5)
Lymphs Abs: 2797 cells/uL (ref 850–3900)
MCH: 30.2 pg (ref 27.0–33.0)
MCHC: 33.7 g/dL (ref 32.0–36.0)
MCV: 89.7 fL (ref 80.0–100.0)
MONOS PCT: 6.3 %
MPV: 10.8 fL (ref 7.5–12.5)
NEUTROS ABS: 5603 {cells}/uL (ref 1500–7800)
Neutrophils Relative %: 60.9 %
Platelets: 366 10*3/uL (ref 140–400)
RBC: 4.64 10*6/uL (ref 3.80–5.10)
RDW: 12.7 % (ref 11.0–15.0)
Total Lymphocyte: 30.4 %
WBC: 9.2 10*3/uL (ref 3.8–10.8)
WBCMIX: 580 {cells}/uL (ref 200–950)

## 2018-04-30 LAB — COMPLETE METABOLIC PANEL WITH GFR
AG RATIO: 1.6 (calc) (ref 1.0–2.5)
ALBUMIN MSPROF: 4.3 g/dL (ref 3.6–5.1)
ALKALINE PHOSPHATASE (APISO): 64 U/L (ref 33–130)
ALT: 18 U/L (ref 6–29)
AST: 16 U/L (ref 10–35)
BILIRUBIN TOTAL: 0.3 mg/dL (ref 0.2–1.2)
BUN / CREAT RATIO: 12 (calc) (ref 6–22)
BUN: 14 mg/dL (ref 7–25)
CHLORIDE: 110 mmol/L (ref 98–110)
CO2: 22 mmol/L (ref 20–32)
Calcium: 10.2 mg/dL (ref 8.6–10.4)
Creat: 1.15 mg/dL — ABNORMAL HIGH (ref 0.50–0.99)
GFR, EST AFRICAN AMERICAN: 60 mL/min/{1.73_m2} (ref >=60)
GFR, Est Non African American: 52 mL/min/{1.73_m2} — ABNORMAL LOW (ref >=60)
GLOBULIN: 2.7 g/dL (ref 1.9–3.7)
GLUCOSE: 102 mg/dL (ref 65–139)
POTASSIUM: 5 mmol/L (ref 3.5–5.3)
SODIUM: 143 mmol/L (ref 135–146)
TOTAL PROTEIN: 7 g/dL (ref 6.1–8.1)

## 2018-04-30 LAB — LIPID PANEL
CHOLESTEROL: 287 mg/dL — AB (ref <200)
HDL: 41 mg/dL — ABNORMAL LOW (ref >50)
LDL CHOLESTEROL (CALC): 195 mg/dL — AB
Non-HDL Cholesterol (Calc): 246 mg/dL (calc) — ABNORMAL HIGH (ref <130)
Total CHOL/HDL Ratio: 7 (calc) — ABNORMAL HIGH (ref <5.0)
Triglycerides: 287 mg/dL — ABNORMAL HIGH (ref <150)

## 2018-04-30 LAB — TSH: TSH: 3.11 mIU/L (ref 0.40–4.50)

## 2018-05-01 LAB — CYTOLOGY - PAP
Diagnosis: NEGATIVE
HPV: NOT DETECTED

## 2018-05-02 ENCOUNTER — Other Ambulatory Visit: Payer: Self-pay | Admitting: Family Medicine

## 2018-05-02 DIAGNOSIS — N183 Chronic kidney disease, stage 3 unspecified: Secondary | ICD-10-CM

## 2018-05-02 DIAGNOSIS — E782 Mixed hyperlipidemia: Secondary | ICD-10-CM

## 2018-05-02 MED ORDER — ATORVASTATIN CALCIUM 80 MG PO TABS: 80.0000 mg | ORAL_TABLET | Freq: Every day | ORAL | 1 refills | Status: DC

## 2018-05-02 NOTE — Progress Notes (Signed)
Increase statin to 80 Recheck lipids in 6-8 weeks BMP and urine in 6-8 weeks

## 2018-05-07 NOTE — Assessment & Plan Note (Signed)
USPSTF grade A and B recommendations reviewed with patient; age-appropriate recommendations, preventive care, screening tests, etc discussed and encouraged; healthy living encouraged; see AVS for patient education given to patient  

## 2018-05-16 ENCOUNTER — Telehealth: Payer: Self-pay | Admitting: Family Medicine

## 2018-05-17 NOTE — Telephone Encounter (Signed)
Called in.

## 2018-05-17 NOTE — Telephone Encounter (Signed)
E-prescribing error; please call in to pharmacy (atorvastatin)

## 2018-05-17 NOTE — Telephone Encounter (Signed)
Left voicemail for pharmacy with detailed instructions.

## 2018-05-23 ENCOUNTER — Other Ambulatory Visit: Payer: Self-pay | Admitting: Family Medicine

## 2018-05-24 ENCOUNTER — Ambulatory Visit: Payer: Medicare Other | Admitting: Dietician

## 2018-06-05 ENCOUNTER — Other Ambulatory Visit: Payer: Self-pay | Admitting: Family Medicine

## 2018-06-05 NOTE — Telephone Encounter (Signed)
Please call patient and give her the lab results from Sept 9th She should be taking 80 mg of atorvastatin and will need recheck lipids 6-8 weeks after she makes the change I just received a request for 40 mg strength (which I am declining because that strength is too low)

## 2018-06-06 NOTE — Telephone Encounter (Signed)
Left detailed voicemail

## 2018-08-15 ENCOUNTER — Telehealth: Payer: Self-pay | Admitting: Family Medicine

## 2018-08-15 NOTE — Telephone Encounter (Signed)
Please remind patient of the labs that were due in October Also, we don't have any results from the cologuard testing that were ordered in January 2019

## 2018-08-15 NOTE — Telephone Encounter (Signed)
Pt refused to come to the phone.

## 2018-09-23 ENCOUNTER — Telehealth: Payer: Self-pay | Admitting: Family Medicine

## 2018-09-23 DIAGNOSIS — R928 Other abnormal and inconclusive findings on diagnostic imaging of breast: Secondary | ICD-10-CM

## 2018-09-23 NOTE — Telephone Encounter (Signed)
From last imaging on 04/10/2018:  RECOMMENDATION: RIGHT breast diagnostic mammogram and ultrasound in 6 months.  ----------------------------------------------  Please contact patient Let her know this is coming up, get scheduled

## 2018-09-23 NOTE — Telephone Encounter (Signed)
-----   Message from Kerman Passey, MD sent at 04/10/2018  4:00 PM EDT ----- Regarding: RIGHT breast imaging due Feb 2020 From 04/10/2018: Recommend follow-up RIGHT breast diagnostic mammogram and ultrasound in 6 months to ensure stability.  RECOMMENDATION: RIGHT breast diagnostic mammogram and ultrasound in 6 months.

## 2018-09-24 NOTE — Telephone Encounter (Signed)
Notified, info given

## 2018-10-14 ENCOUNTER — Other Ambulatory Visit: Payer: Medicare Other

## 2018-10-24 ENCOUNTER — Telehealth: Payer: Self-pay | Admitting: Family Medicine

## 2018-10-24 DIAGNOSIS — M2550 Pain in unspecified joint: Secondary | ICD-10-CM

## 2018-10-24 DIAGNOSIS — H9190 Unspecified hearing loss, unspecified ear: Secondary | ICD-10-CM

## 2018-10-24 NOTE — Telephone Encounter (Signed)
Copied from CRM [id number]. Topic: Referral - Request for Referral >> Oct 24, 2018  4:53 PM Jens Som A wrote: Has patient seen PCP for this complaint? Yes  *If NO, is insurance requiring patient see PCP for this issue before PCP can refer them? NO Referral for which specialty Audiologist Preferred provider/office: no Reason for referral: Hearing Loss

## 2018-10-25 ENCOUNTER — Other Ambulatory Visit: Payer: Medicare Other

## 2018-10-25 ENCOUNTER — Inpatient Hospital Stay: Admission: RE | Admit: 2018-10-25 | Payer: Medicare Other | Source: Ambulatory Visit

## 2018-10-25 ENCOUNTER — Telehealth: Payer: Self-pay

## 2018-10-25 DIAGNOSIS — M2550 Pain in unspecified joint: Secondary | ICD-10-CM

## 2018-10-25 NOTE — Telephone Encounter (Signed)
Referral entered  

## 2018-10-25 NOTE — Telephone Encounter (Signed)
Pt does not want to travel and stated she would go to [place].   Copied from CRM [id number]. Topic: Referral - Request for Referral >> Oct 24, 2018  4:39 PM Jay Schlichter wrote: Has patient seen PCP for this complaint? Yes.   *If NO, is insurance requiring patient see PCP for this issue before PCP can refer them? Referral for which specialty: rheumatology  Preferred provider/office: would like a DO, [place]. Or someone who Dr Sherie Don knows that is  a DO  Reason for referral:previous referral, never went to appt, does not want to go to [facility]

## 2018-10-25 NOTE — Telephone Encounter (Signed)
That's fine if she is willing to travel, but there isn't a rheumatologist who is a DO in [place] I searched the [employer] data base The only DO rheumatologists in the state are in [place], [place], and [place] Please REFER if she is interested  Dr. Consuella L[facility] [address] [place], [place] [address] [phone]   Dr. Bebe Liter [facility] [address] [place], [place] [address] [phone]   Dr. Therese Sarah [facility] [address] [place], [place] [address]  [phone]

## 2018-10-29 NOTE — Telephone Encounter (Signed)
Erroneous entry

## 2018-11-06 ENCOUNTER — Other Ambulatory Visit: Payer: Self-pay | Admitting: Family Medicine

## 2018-11-06 DIAGNOSIS — J439 Emphysema, unspecified: Secondary | ICD-10-CM

## 2018-11-06 MED ORDER — ATORVASTATIN CALCIUM 80 MG PO TABS: 80.0000 mg | ORAL_TABLET | Freq: Every day | ORAL | 0 refills | Status: DC

## 2018-11-06 NOTE — Telephone Encounter (Signed)
I received a refill request for her rescue inhalers again She is going through about one a month based on request / fill history This is much too much and suggests poorly controlled respiratory symptoms I also received refill rquest for wrong strength of atorvastatin; patient should be on 80 mg; 40 mg denied, new Rx for 80 mg sent  ---------------------------------------  Asher Muir, please let patient know: I'm going to refer her to pulmonology to address and treat her emphysema; she's using a lot of the rescue inhaler Also, URGE her to quit smoking; this is crucial with lung disease I received a request for Chantix maintenance, but she shouldn't start this back if she has been off for a while so I declined the maintenance refill; it needs to be tapered back up; if she truly truly wants to quit and will pick a quit date, I'll send in a starter month (back to me for Rx) She shouldn't take Chantix, though, if she's really not ready to quit because there are risks associated with it (heart attack, stroke, suicidal ideation, etc.)

## 2018-11-07 NOTE — Telephone Encounter (Signed)
Left detailed voicemail to call back regarding info

## 2018-11-08 ENCOUNTER — Other Ambulatory Visit: Payer: Medicare Other

## 2019-01-06 ENCOUNTER — Emergency Department
Admission: EM | Admit: 2019-01-06 | Discharge: 2019-01-06 | Disposition: A | Discharge status: Home / Self Care | Payer: Medicare Other | Attending: Emergency Medicine | Admitting: Emergency Medicine

## 2019-01-06 ENCOUNTER — Other Ambulatory Visit: Payer: Self-pay

## 2019-01-06 ENCOUNTER — Encounter: Payer: Self-pay | Admitting: Emergency Medicine

## 2019-01-06 DIAGNOSIS — Z5321 Procedure and treatment not carried out due to patient leaving prior to being seen by health care provider: Secondary | ICD-10-CM | POA: Insufficient documentation

## 2019-01-06 DIAGNOSIS — R55 Syncope and collapse: Secondary | ICD-10-CM | POA: Insufficient documentation

## 2019-01-06 LAB — CBC
HCT: 40.6 % (ref 36.0–46.0)
Hemoglobin: 13.5 g/dL (ref 12.0–15.0)
MCH: 30.1 pg (ref 26.0–34.0)
MCHC: 33.3 g/dL (ref 30.0–36.0)
MCV: 90.4 fL (ref 80.0–100.0)
Platelets: 331 10*3/uL (ref 150–400)
RBC: 4.49 MIL/uL (ref 3.87–5.11)
RDW: 12.5 % (ref 11.5–15.5)
WBC: 8.2 10*3/uL (ref 4.0–10.5)
nRBC: 0 % (ref 0.0–0.2)

## 2019-01-06 LAB — BASIC METABOLIC PANEL
Anion gap: 10 (ref 5–15)
BUN: 18 mg/dL (ref 8–23)
CO2: 20 mmol/L — ABNORMAL LOW (ref 22–32)
Calcium: 9.2 mg/dL (ref 8.9–10.3)
Chloride: 108 mmol/L (ref 98–111)
Creatinine, Ser: 0.84 mg/dL (ref 0.44–1.00)
GFR calc Af Amer: >60 mL/min (ref >60)
GFR calc non Af Amer: >60 mL/min (ref >60)
Glucose, Bld: 120 mg/dL — ABNORMAL HIGH (ref 70–99)
Potassium: 4.1 mmol/L (ref 3.5–5.1)
Sodium: 138 mmol/L (ref 135–145)

## 2019-01-06 MED ORDER — SODIUM CHLORIDE 0.9% FLUSH: 3.0000 mL | Freq: Once | INTRAVENOUS | Status: DC

## 2019-01-06 NOTE — ED Triage Notes (Signed)
Had an episode Saturday night while sitting in a chair of feeling dizzy, blurred vision, syncopal event (patient has no memory of event).  States had a 'hallucination and then passed out again -- accompanied by vomiting.   Describes feeling 'woozy' for about 30-45 minutes after episode.  States initially felt better, but today c/o feeling lightheaded.  AAOx3.  Skin warm and dry.  MAE equally and strong.  Facial movements equal.  Speech clear.

## 2019-01-07 ENCOUNTER — Telehealth: Payer: Self-pay | Admitting: Emergency Medicine

## 2019-01-07 ENCOUNTER — Other Ambulatory Visit: Payer: Self-pay | Admitting: Family Medicine

## 2019-01-07 NOTE — Telephone Encounter (Signed)
Called patient due to lwot to inquire about condition and follow up plans. She says she will call her pcp to have them review results from here.

## 2019-01-08 ENCOUNTER — Telehealth: Payer: Self-pay

## 2019-01-08 NOTE — Telephone Encounter (Signed)
Reached out again and she states nurse did not give details about blood work

## 2019-01-08 NOTE — Telephone Encounter (Signed)
Per Asher Muir, I tried to call this patient back to see if I can be of help to her regarding the questions she has about the blood work from her ED visit, but there was no answer.  A message was left for her stating that everything seems to be good with the exception of an elevated glucose which could be due to her not fasting. She was asked to give our office a call when she got the chance so we could go over them more thoroughly.

## 2019-01-09 ENCOUNTER — Telehealth: Payer: Self-pay | Admitting: Nurse Practitioner

## 2019-01-09 ENCOUNTER — Ambulatory Visit (INDEPENDENT_AMBULATORY_CARE_PROVIDER_SITE_OTHER): Payer: Medicare Other | Admitting: Nurse Practitioner

## 2019-01-09 ENCOUNTER — Other Ambulatory Visit: Payer: Self-pay

## 2019-01-09 ENCOUNTER — Encounter: Payer: Self-pay | Admitting: Nurse Practitioner

## 2019-01-09 DIAGNOSIS — K580 Irritable bowel syndrome with diarrhea: Secondary | ICD-10-CM

## 2019-01-09 DIAGNOSIS — K30 Functional dyspepsia: Secondary | ICD-10-CM | POA: Diagnosis not present

## 2019-01-09 DIAGNOSIS — E785 Hyperlipidemia, unspecified: Secondary | ICD-10-CM | POA: Diagnosis not present

## 2019-01-09 DIAGNOSIS — R55 Syncope and collapse: Secondary | ICD-10-CM

## 2019-01-09 MED ORDER — LOPERAMIDE HCL 2 MG PO CAPS: 2.0000 mg | ORAL_CAPSULE | Freq: Two times a day (BID) | ORAL | 0 refills | Status: DC

## 2019-01-09 NOTE — Telephone Encounter (Signed)
Please call, since she has not tried any medications for IBS-D we will trial her on a month of OTC loperamide. Start taking 1 2mg  tablet 45 minutes before largest meal daily for one week, if still having diarrhea take 1 tablet before 2 meals daily.  Make sure patient has 2-4 week follow up appointment to readdress this.  Additionally, after reviewing her notes would she like to be reffered to a neurologist for further evaluation of syncopal episodes?

## 2019-01-09 NOTE — Patient Instructions (Signed)
Diet for Irritable Bowel Syndrome  When you have irritable bowel syndrome (IBS), it is very important to eat the foods and follow the eating habits that are best for your condition. IBS may cause various symptoms such as pain in the abdomen, constipation, or diarrhea. Choosing the right foods can help to ease the discomfort from these symptoms. Work with your health care provider and diet and nutrition specialist (dietitian) to find the eating plan that will help to control your symptoms.  What are tips for following this plan?         · Keep a food diary. This will help you identify foods that cause symptoms. Write down:  ? What you eat and when you eat it.  ? What symptoms you have.  ? When symptoms occur in relation to your meals, such as "pain in abdomen 2 hours after dinner."  · Eat your meals slowly and in a relaxed setting.  · Aim to eat 5-6 small meals per day. Do not skip meals.  · Drink enough fluid to keep your urine pale yellow.  · Ask your health care provider if you should take an over-the-counter probiotic to help restore healthy bacteria in your gut (digestive tract).  ? Probiotics are foods that contain good bacteria and yeasts.  · Your dietitian may have specific dietary recommendations for you based on your symptoms. He or she may recommend that you:  ? Avoid foods that cause symptoms. Talk with your dietitian about other ways to get the same nutrients that are in those problem foods.  ? Avoid foods with gluten. Gluten is a protein that is found in rye, wheat, and barley.  ? Eat more foods that contain soluble fiber. Examples of foods with high soluble fiber include oats, seeds, and certain fruits and vegetables. Take a fiber supplement if directed by your dietitian.  ? Reduce or avoid certain foods called FODMAPs. These are foods that contain carbohydrates that are hard to digest. Ask your doctor which foods contain these carbohydrates.  What foods are not recommended?  The following are some  foods and drinks that may make your symptoms worse:  · Fatty foods, such as french fries.  · Foods that contain gluten, such as pasta and cereal.  · Dairy products, such as milk, cheese, and ice cream.  · Chocolate.  · Alcohol.  · Products with caffeine, such as coffee.  · Carbonated drinks, such as soda.  · Foods that are high in FODMAPs. These include certain fruits and vegetables.  · Products with sweeteners such as honey, high fructose corn syrup, sorbitol, and mannitol.  The items listed above may not be a complete list of foods and beverages you should avoid. Contact a dietitian for more information.  What foods are good sources of fiber?  Your health care provider or dietitian may recommend that you eat more foods that contain fiber. Fiber can help to reduce constipation and other IBS symptoms. Add foods with fiber to your diet a little at a time so your body can get used to them. Too much fiber at one time might cause gas and swelling of your abdomen. The following are some foods that are good sources of fiber:  · Berries, such as raspberries, strawberries, and blueberries.  · Tomatoes.  · Carrots.  · Brown rice.  · Oats.  · Seeds, such as chia and pumpkin seeds.  The items listed above may not be a complete list of recommended sources of fiber. Contact   your dietitian for more options.  Where to find more information  · International Foundation for Functional Gastrointestinal Disorders: www.iffgd.org  · National Institute of Diabetes and Digestive and Kidney Diseases: www.niddk.nih.gov  Summary  · When you have irritable bowel syndrome (IBS), it is very important to eat the foods and follow the eating habits that are best for your condition.  · IBS may cause various symptoms such as pain in the abdomen, constipation, or diarrhea.  · Choosing the right foods can help to ease the discomfort that comes from symptoms.  · Keep a food diary. This will help you identify foods that cause symptoms.  · Your health  care provider or diet and nutrition specialist (dietitian) may recommend that you eat more foods that contain fiber.  This information is not intended to replace advice given to you by your health care provider. Make sure you discuss any questions you have with your health care provider.  Document Released: 10/28/2003 Document Revised: 03/04/2018 Document Reviewed: 04/10/2017  Elsevier Interactive Patient Education © 2019 Elsevier Inc.

## 2019-01-09 NOTE — Progress Notes (Signed)
Virtual Visit via Video Note  I connected with Jenna Meyers  on 01/09/19 at 11:00 AM EDT by a video enabled telemedicine application and verified that I am speaking with the correct person using two identifiers.   Staff discussed the limitations of evaluation and management by telemedicine and the availability of in person appointments. The patient expressed understanding and agreed to proceed.  Patient location: home  My location: home office Other people present: none HPI  Patient went to ER on 5/18 reporting having syncopal episode on 5/16 where she felt dizzy, had blurred vision and syncopal episode. States Saturday went to outdoor cookout, was sitting in lawn chair and felt woozy and blurred vision. She told her son she was not feeling well states she looked out into the woods and had a hallucination. States son witnessed 2 syncopal episodes. States second syncopal episode vomited on self and then woke up and told her son to cancel 911 call. States after incident felt weak and lightheaded.   States had one liquor drink with lunch, and a light beer with dinner.   States since then she notices feeling of indigestion after eating since then. States as chronic diarrhea  States has abdominal discomfort 1-2 times a week has had this for longer than 6 months. Pain improves after completing bowel movement, sometimes has to go back to bathroom more frequently. States very rarely has constipation, mostly has diarrhea or watery stools.   PHQ2/9: Depression screen [facility] 2/9 01/09/2019 04/29/2018 04/23/2018 04/04/2018 09/14/2017  Decreased Interest 0 0 1 1 0  Down, Depressed, Hopeless 0  PHQ - 2 Score 0  Altered sleeping - -  Tired, decreased energy - -  Change in appetite - 0 1 0 -  Feeling bad or failure about yourself  - 0 1 1 -  Trouble concentrating - 0 0 1 -  Moving slowly or fidgety/restless - 0 0 0 -  Suicidal thoughts - 0 0 0 -  PHQ-9 Score - -  Difficult  doing work/chores - Not difficult at all Somewhat difficult Somewhat difficult -    PHQ reviewed. Positive- on treatment.   Patient Active Problem List   Diagnosis Date Noted  . Chronic kidney disease, stage III (moderate) (HCC) 05/02/2018  . Morbid obesity (HCC) 04/14/2018  . Moderate episode of recurrent major depressive disorder (HCC) 04/14/2018  . Aortic insufficiency 03/19/2018  . LVH (left ventricular hypertrophy) 03/19/2018  . Grade I diastolic dysfunction 03/19/2018  . Mitral regurgitation 03/19/2018  . Lumbar canal stenosis 09/14/2017  . Emphysema of lung (HCC) 05/31/2017  . Palpitations 05/30/2017  . Chest tightness or pressure 05/30/2017  . Medication monitoring encounter 02/09/2016  . Preventative health care 02/09/2016  . Colon cancer screening 02/09/2016  . Essential hypertension, benign 05/17/2015  . Acute allergic reaction 05/17/2015  . Situational stress 05/17/2015  . Obesity 03/03/2015  . Abnormal thyroid blood test 03/03/2015  . Tobacco abuse 03/03/2015  . Hyperlipidemia   . Lumbago   . Fibromyalgia   . Vitamin D deficiency disease   . Anxiety 10/09/2012    Past Medical History:  Diagnosis Date  . Anxiety 10/09/2012  . Aortic insufficiency 03/19/2018   ECHO July 2019  . Arthritis    back  . Depression   . Emphysema lung (HCC)   . Fibromyalgia   . Grade I diastolic dysfunction 03/19/2018   ECHO July 2019  . Hyperlipidemia   .  Lumbago   . Lumbar canal stenosis 09/14/2017   Moderate to severe, MRI Nov 2018  . LVH (left ventricular hypertrophy) 03/19/2018   ECHO July 2019  . Mitral regurgitation 03/19/2018   ECHO July 2019  . Tobacco abuse   . Vitamin D deficiency disease     Past Surgical History:  Procedure Laterality Date  . ABDOMINAL HYSTERECTOMY  2000   partial, still has cervix  . CESAREAN SECTION  1996  . LAPAROSCOPIC ABDOMINAL EXPLORATION  1999  . TONSILLECTOMY AND ADENOIDECTOMY  1962    Social History   Tobacco Use  . Smoking  status: Light Tobacco Smoker    Packs/day: 0.15    Years: 45.00    Pack years: 6.75    Types: Cigarettes  . Smokeless tobacco: Never Used  . Tobacco comment: been given a Rx for Chantix  Substance Use Topics  . Alcohol use: No     Current Outpatient Medications:  .  albuterol (PROVENTIL HFA;VENTOLIN HFA) 108 (90 Base) MCG/ACT inhaler, INHALE 2 PUFFS BY MOUTH  EVERY 4 HOURS AS NEEDED FOR WHEEZING OR SHORTNESS OF  BREATH, Disp: 3 Inhaler, Rfl: 0 .  aspirin EC 81 MG tablet, Take 1 tablet (81 mg total) by mouth daily. Take at least one hour before naproxen, Disp: , Rfl:  .  atorvastatin (LIPITOR) 80 MG tablet, TAKE 1 TABLET BY MOUTH  DAILY AT 6 PM., Disp: 90 tablet, Rfl: 0 .  DULoxetine (CYMBALTA) 60 MG capsule, Take 1 capsule (60 mg total) by mouth daily., Disp: 90 capsule, Rfl: 2 .  metoprolol succinate (TOPROL-XL) 25 MG 24 hr tablet, TAKE 1 TABLET BY MOUTH  DAILY FOR BLOOD PRESSURE, Disp: 90 tablet, Rfl: 1 .  Turmeric Curcumin 500 MG CAPS, Take 500 mg by mouth 2 (two) times daily. , Disp: , Rfl:  .  lisinopril (PRINIVIL,ZESTRIL) 10 MG tablet, Take 10 mg by mouth daily., Disp: , Rfl:   No Known Allergies  Review of Systems  Constitutional: Negative for chills, fever and malaise/fatigue.  HENT: Negative for congestion, sinus pain and sore throat.   Eyes: Negative for blurred vision.  Respiratory: Negative for cough and shortness of breath.   Cardiovascular: Negative for chest pain, palpitations and leg swelling.  Gastrointestinal: Positive for diarrhea (chronic diarrhea). Negative for abdominal pain, constipation and nausea.  Genitourinary: Negative for dysuria.  Musculoskeletal: Negative for falls and joint pain.  Skin: Negative for rash.  Neurological: Negative for dizziness and headaches.  Endo/Heme/Allergies: Negative for polydipsia.  Psychiatric/Behavioral: The patient is not nervous/anxious and does not have insomnia.      No other specific complaints in a complete review of  systems (except as listed in HPI above).  Objective  There were no vitals filed for this visit.   There is no height or weight on file to calculate BMI.  Nursing Note and Vital Signs reviewed.  Physical Exam  Constitutional: Patient appears well-developed and well-nourished. No distress.  HENT: Head: Normocephalic and atraumatic. Pulmonary/Chest: Effort normal  Musculoskeletal: Normal range of motion,  Abdominal: no abdominal tenderness  Neurological: alert and oriented, speech normal.  Skin: No rash noted. No erythema.  Psychiatric: Patient has a normal mood and affect. behavior is normal. Judgment and thought content normal.    Assessment & Plan 1. Syncope, unspecified syncope type Discussed ER precautions, hydration, diet, consider neuro vs cards referral.  - COMPLETE METABOLIC PANEL WITH GFR  2. Indigestion - H.pylori screen, POC  3. Hyperlipidemia, unspecified hyperlipidemia type - Lipid Profile  4. Irritable bowel syndrome with diarrhea Start taking 1 tablet 45 minutes before largest meal daily, if still having diarrhea take 1 tablet before 2 meals daily.  - loperamide (IMODIUM) 2 MG capsule; Take 1 capsule (2 mg total) by mouth 2 (two) times daily before a meal.  Dispense: 30 capsule; Refill: 0    Follow Up Instructions:   follow up in one month or sooner if needed.   I discussed the assessment and treatment plan with the patient. The patient was provided an opportunity to ask questions and all were answered. The patient agreed with the plan and demonstrated an understanding of the instructions.   The patient was advised to call back or seek an in-person evaluation if the symptoms worsen or if the condition fails to improve as anticipated.  I provided 28 minutes of non-face-to-face time during this encounter.   Cheryle Horsfall, NP

## 2019-01-10 NOTE — Telephone Encounter (Signed)
No answer

## 2019-01-10 NOTE — Telephone Encounter (Signed)
Tried to contact this patient to review Jenna Meyers's message but there was no answer. A message was left on her voicemail with that information on it.  CRM will be placed.

## 2019-01-14 ENCOUNTER — Other Ambulatory Visit: Payer: Self-pay | Admitting: Nurse Practitioner

## 2019-01-14 DIAGNOSIS — R55 Syncope and collapse: Secondary | ICD-10-CM

## 2019-01-14 LAB — COMPLETE METABOLIC PANEL WITH GFR
AG Ratio: 1.9 (calc) (ref 1.0–2.5)
ALT: 17 U/L (ref 6–29)
AST: 15 U/L (ref 10–35)
Albumin: 4.5 g/dL (ref 3.6–5.1)
Alkaline phosphatase (APISO): 66 U/L (ref 37–153)
BUN/Creatinine Ratio: 19 (calc) (ref 6–22)
BUN: 19 mg/dL (ref 7–25)
CO2: 23 mmol/L (ref 20–32)
Calcium: 9.5 mg/dL (ref 8.6–10.4)
Chloride: 106 mmol/L (ref 98–110)
Creat: 1.01 mg/dL — ABNORMAL HIGH (ref 0.50–0.99)
GFR, Est African American: 70 mL/min/{1.73_m2} (ref >=60)
GFR, Est Non African American: 60 mL/min/{1.73_m2} (ref >=60)
Globulin: 2.4 g/dL (calc) (ref 1.9–3.7)
Glucose, Bld: 92 mg/dL (ref 65–99)
Potassium: 4.3 mmol/L (ref 3.5–5.3)
Sodium: 138 mmol/L (ref 135–146)
Total Bilirubin: 0.4 mg/dL (ref 0.2–1.2)
Total Protein: 6.9 g/dL (ref 6.1–8.1)

## 2019-01-14 LAB — LIPID PANEL
Cholesterol: 188 mg/dL (ref <200)
HDL: 44 mg/dL — ABNORMAL LOW (ref >=50)
LDL Cholesterol (Calc): 114 mg/dL (calc) — ABNORMAL HIGH
Non-HDL Cholesterol (Calc): 144 mg/dL (calc) — ABNORMAL HIGH (ref <130)
Total CHOL/HDL Ratio: 4.3 (calc) (ref <5.0)
Triglycerides: 181 mg/dL — ABNORMAL HIGH (ref <150)

## 2019-01-14 LAB — H. PYLORI BREATH TEST: H. pylori Breath Test: NOT DETECTED

## 2019-01-23 ENCOUNTER — Ambulatory Visit (INDEPENDENT_AMBULATORY_CARE_PROVIDER_SITE_OTHER): Payer: Medicare Other | Admitting: Nurse Practitioner

## 2019-01-23 ENCOUNTER — Encounter: Payer: Self-pay | Admitting: Nurse Practitioner

## 2019-01-23 VITALS — Resp 16

## 2019-01-23 DIAGNOSIS — J439 Emphysema, unspecified: Secondary | ICD-10-CM | POA: Diagnosis not present

## 2019-01-23 DIAGNOSIS — E782 Mixed hyperlipidemia: Secondary | ICD-10-CM

## 2019-01-23 DIAGNOSIS — N183 Chronic kidney disease, stage 3 unspecified: Secondary | ICD-10-CM

## 2019-01-23 DIAGNOSIS — I10 Essential (primary) hypertension: Secondary | ICD-10-CM

## 2019-01-23 DIAGNOSIS — Z720 Tobacco use: Secondary | ICD-10-CM

## 2019-01-23 DIAGNOSIS — K580 Irritable bowel syndrome with diarrhea: Secondary | ICD-10-CM

## 2019-01-23 DIAGNOSIS — F331 Major depressive disorder, recurrent, moderate: Secondary | ICD-10-CM

## 2019-01-23 DIAGNOSIS — M797 Fibromyalgia: Secondary | ICD-10-CM

## 2019-01-23 MED ORDER — METOPROLOL SUCCINATE ER 25 MG PO TB24: 25.0000 mg | ORAL_TABLET | Freq: Every day | ORAL | 1 refills | Status: DC

## 2019-01-23 MED ORDER — DULOXETINE HCL 60 MG PO CPEP: 60.0000 mg | ORAL_CAPSULE | Freq: Every day | ORAL | 1 refills | Status: DC

## 2019-01-23 MED ORDER — ATORVASTATIN CALCIUM 80 MG PO TABS: 80.0000 mg | ORAL_TABLET | Freq: Every day | ORAL | 1 refills | Status: DC

## 2019-01-23 MED ORDER — VARENICLINE TARTRATE 0.5 MG X 11 & 1 MG X 42 PO MISC: ORAL | 0 refills | Status: DC

## 2019-01-23 MED ORDER — LOPERAMIDE HCL 2 MG PO CAPS: 2.0000 mg | ORAL_CAPSULE | Freq: Two times a day (BID) | ORAL | 1 refills | Status: DC

## 2019-01-23 MED ORDER — VARENICLINE TARTRATE 1 MG PO TABS: 1.0000 mg | ORAL_TABLET | Freq: Two times a day (BID) | ORAL | 0 refills | Status: DC

## 2019-01-23 NOTE — Progress Notes (Signed)
Virtual Visit via Video Note  I connected with Jenna Meyers on 01/23/19 at 11:40 AM EDT by a video enabled telemedicine application and verified that I am speaking with the correct person using two identifiers.   Staff discussed the limitations of evaluation and management by telemedicine and the availability of in person appointments. The patient expressed understanding and agreed to proceed.  Patient location: home  My location: home office Other people present: none HPI  Hypertension Patient is prescribed  metoprolol  25mg  daily, stopped taking lisinopril 6 months ago- states her blood pressure was doing well without it so she has not restarted it.  Does not check blood pressures at home-  BP Readings from Last 3 Encounters:  01/06/19 (!) 134/92  04/29/18 122/62  04/23/18 128/72    Pulmonary emphysema Patient is prescribed PRN albuterol; states usually only needs it if she gets a URI.  Hyperlipidemia Patient is prescribed atorvastatin 80 mg daily. States she was not taking it daily due to forgetting to take it bedtime- but set alarm and is doing better. Has improved diet- eating more salads and fruits since last conversation and eating whole grains.  Lab Results  Component Value Date   CHOL 188 01/10/2019   HDL 44 (L) 01/10/2019   LDLCALC 114 (H) 01/10/2019   TRIG 181 (H) 01/10/2019   CHOLHDL 4.3 01/10/2019    Fibromyalgia Patient is precribed cymbalta 60 MG daily, helps with functioning and pain   Major depressive disorder  Patient is precribed cymbalta 60 MG daily. Feels it is well-controlled under circumstances states she has started to smoke again due to increased stress with pandemic and family living with her and she is smoking about a half pack a day but wants to quit again. Has used chantix twice and successfully quit for periods of time.   IBS-D Patient was started on loperamide 2mg  BID states feels stools are more formed and abdominal urgency is improved. has  abdominal discomfort 1-2 times a week has had this for longer than 6 months. Pain improves after completing bowel movement, sometimes has to go back to bathroom more frequently. States very rarely has constipation, mostly has diarrhea or watery stools.   PHQ2/9: Depression screen [facility] 2/9 01/23/2019 01/09/2019 04/29/2018 04/23/2018 04/04/2018  Decreased Interest 0 0 0 1 1  Down, Depressed, Hopeless 0 1 1 1 1   PHQ - 2 Score 0 1 1 2 2   Altered sleeping 0 - 2 1 1   Tired, decreased energy 0 - 2 1 1   Change in appetite 0 - 0 1 0  Feeling bad or failure about yourself  0 - 0 1 1  Trouble concentrating 0 - 0 0 1  Moving slowly or fidgety/restless 0 - 0 0 0  Suicidal thoughts 0 - 0 0 0  PHQ-9 Score 0 - 5 6 6   Difficult doing work/chores Not difficult at all - Not difficult at all Somewhat difficult Somewhat difficult   PHQ reviewed. Negative  Patient Active Problem List   Diagnosis Date Noted  . Morbid obesity (HCC) 04/14/2018  . Moderate episode of recurrent major depressive disorder (HCC) 04/14/2018  . Aortic insufficiency 03/19/2018  . LVH (left ventricular hypertrophy) 03/19/2018  . Grade I diastolic dysfunction 03/19/2018  . Mitral regurgitation 03/19/2018  . Lumbar canal stenosis 09/14/2017  . Emphysema of lung (HCC) 05/31/2017  . Palpitations 05/30/2017  . Chest tightness or pressure 05/30/2017  . Medication monitoring encounter 02/09/2016  . Preventative health care 02/09/2016  . Colon cancer  screening 02/09/2016  . Essential hypertension, benign 05/17/2015  . Acute allergic reaction 05/17/2015  . Situational stress 05/17/2015  . Obesity 03/03/2015  . Abnormal thyroid blood test 03/03/2015  . Tobacco abuse 03/03/2015  . Hyperlipidemia   . Lumbago   . Fibromyalgia   . Vitamin D deficiency disease   . Anxiety 10/09/2012    Past Medical History:  Diagnosis Date  . Anxiety 10/09/2012  . Aortic insufficiency 03/19/2018   ECHO July 2019  . Arthritis    back  . Depression   .  Emphysema lung (HCC)   . Fibromyalgia   . Grade I diastolic dysfunction 03/19/2018   ECHO July 2019  . Hyperlipidemia   . Lumbago   . Lumbar canal stenosis 09/14/2017   Moderate to severe, MRI Nov 2018  . LVH (left ventricular hypertrophy) 03/19/2018   ECHO July 2019  . Mitral regurgitation 03/19/2018   ECHO July 2019  . Tobacco abuse   . Vitamin D deficiency disease     Past Surgical History:  Procedure Laterality Date  . ABDOMINAL HYSTERECTOMY  2000   partial, still has cervix  . CESAREAN SECTION  1996  . LAPAROSCOPIC ABDOMINAL EXPLORATION  1999  . TONSILLECTOMY AND ADENOIDECTOMY  1962    Social History   Tobacco Use  . Smoking status: Light Tobacco Smoker    Packs/day: 0.15    Years: 45.00    Pack years: 6.75    Types: Cigarettes  . Smokeless tobacco: Never Used  . Tobacco comment: been given a Rx for Chantix  Substance Use Topics  . Alcohol use: No     Current Outpatient Medications:  .  albuterol (PROVENTIL HFA;VENTOLIN HFA) 108 (90 Base) MCG/ACT inhaler, INHALE 2 PUFFS BY MOUTH  EVERY 4 HOURS AS NEEDED FOR WHEEZING OR SHORTNESS OF  BREATH, Disp: 3 Inhaler, Rfl: 0 .  aspirin EC 81 MG tablet, Take 1 tablet (81 mg total) by mouth daily. Take at least one hour before naproxen, Disp: , Rfl:  .  atorvastatin (LIPITOR) 80 MG tablet, Take 1 tablet (80 mg total) by mouth daily at 6 PM., Disp: 90 tablet, Rfl: 1 .  DULoxetine (CYMBALTA) 60 MG capsule, Take 1 capsule (60 mg total) by mouth daily., Disp: 90 capsule, Rfl: 1 .  loperamide (IMODIUM) 2 MG capsule, Take 1 capsule (2 mg total) by mouth 2 (two) times daily before a meal., Disp: 180 capsule, Rfl: 1 .  metoprolol succinate (TOPROL-XL) 25 MG 24 hr tablet, Take 1 tablet (25 mg total) by mouth daily. for blood pressure, Disp: 90 tablet, Rfl: 1 .  Turmeric Curcumin 500 MG CAPS, Take 500 mg by mouth 2 (two) times daily. , Disp: , Rfl:  .  lisinopril (PRINIVIL,ZESTRIL) 10 MG tablet, Take 10 mg by mouth daily., Disp: , Rfl:  .   varenicline (CHANTIX CONTINUING MONTH PAK) 1 MG tablet, Take 1 tablet (1 mg total) by mouth 2 (two) times daily., Disp: 180 tablet, Rfl: 0 .  varenicline (CHANTIX STARTING MONTH PAK) 0.5 MG X 11 & 1 MG X 42 tablet, Take one 0.5 mg tablet by mouth once daily for 3 days, then increase to one 0.5 mg tablet twice daily for 4 days, then increase to one 1 mg tablet twice daily., Disp: 53 tablet, Rfl: 0  No Known Allergies  Review of Systems  Constitutional: Negative for chills, fever and malaise/fatigue.  HENT: Negative for congestion, sinus pain and sore throat.   Eyes: Negative for blurred vision.  Respiratory: Negative  for cough and shortness of breath.   Cardiovascular: Negative for chest pain, palpitations and leg swelling.  Gastrointestinal: Negative for abdominal pain, constipation, diarrhea and nausea.  Genitourinary: Negative for dysuria.  Musculoskeletal: Negative for falls and joint pain.  Skin: Negative for rash.  Neurological: Negative for dizziness and headaches.  Endo/Heme/Allergies: Negative for polydipsia.  Psychiatric/Behavioral: The patient is nervous/anxious. The patient does not have insomnia.      No other specific complaints in a complete review of systems (except as listed in HPI above).  Objective  Vitals:   01/23/19 1207  Resp: 16     There is no height or weight on file to calculate BMI.  Nursing Note and Vital Signs reviewed.  Physical Exam  Constitutional: Patient appears well-developed and well-nourished. No distress.  HENT: Head: Normocephalic and atraumatic. Pulmonary/Chest: Effort normal  Musculoskeletal: Normal range of motion,  Neurological: alert and oriented, speech normal.  Psychiatric: Patient has a normal mood and affect. behavior is normal. Judgment and thought content normal.    Assessment & Plan  1. Essential hypertension, benign stable - metoprolol succinate (TOPROL-XL) 25 MG 24 hr tablet; Take 1 tablet (25 mg total) by mouth  daily. for blood pressure  Dispense: 90 tablet; Refill: 1  2. Pulmonary emphysema, unspecified emphysema type (HCC) PRN albuterol, chantix to quit smoking  3. Mixed hyperlipidemia Remembering to take daily with alarm now and improving diet - atorvastatin (LIPITOR) 80 MG tablet; Take 1 tablet (80 mg total) by mouth daily at 6 PM.  Dispense: 90 tablet; Refill: 1  4. Moderate episode of recurrent major depressive disorder (HCC) stable - DULoxetine (CYMBALTA) 60 MG capsule; Take 1 capsule (60 mg total) by mouth daily.  Dispense: 90 capsule; Refill: 1  5. Fibromyalgia stable - DULoxetine (CYMBALTA) 60 MG capsule; Take 1 capsule (60 mg total) by mouth daily.  Dispense: 90 capsule; Refill: 1  6. Irritable bowel syndrome with diarrhea improved - loperamide (IMODIUM) 2 MG capsule; Take 1 capsule (2 mg total) by mouth 2 (two) times daily before a meal.  Dispense: 180 capsule; Refill: 1  7. Tobacco use - varenicline (CHANTIX CONTINUING MONTH PAK) 1 MG tablet; Take 1 tablet (1 mg total) by mouth 2 (two) times daily.  Dispense: 180 tablet; Refill: 0 - varenicline (CHANTIX STARTING MONTH PAK) 0.5 MG X 11 & 1 MG X 42 tablet; Take one 0.5 mg tablet by mouth once daily for 3 days, then increase to one 0.5 mg tablet twice daily for 4 days, then increase to one 1 mg tablet twice daily.  Dispense: 53 tablet; Refill: 0   Follow Up Instructions:   6 months   I discussed the assessment and treatment plan with the patient. The patient was provided an opportunity to ask questions and all were answered. The patient agreed with the plan and demonstrated an understanding of the instructions.   The patient was advised to call back or seek an in-person evaluation if the symptoms worsen or if the condition fails to improve as anticipated.  I provided 22 minutes of non-face-to-face time during this encounter.   Cheryle Horsfall, NP

## 2019-02-13 ENCOUNTER — Telehealth: Payer: Self-pay

## 2019-02-13 ENCOUNTER — Emergency Department: Payer: Medicare Other

## 2019-02-13 ENCOUNTER — Encounter: Payer: Self-pay | Admitting: Intensive Care

## 2019-02-13 ENCOUNTER — Emergency Department
Admission: EM | Admit: 2019-02-13 | Discharge: 2019-02-13 | Disposition: A | Discharge status: Home / Self Care | Payer: Medicare Other | Attending: Emergency Medicine | Admitting: Emergency Medicine

## 2019-02-13 ENCOUNTER — Other Ambulatory Visit: Payer: Self-pay

## 2019-02-13 DIAGNOSIS — Z79899 Other long term (current) drug therapy: Secondary | ICD-10-CM | POA: Insufficient documentation

## 2019-02-13 DIAGNOSIS — Z87891 Personal history of nicotine dependence: Secondary | ICD-10-CM | POA: Diagnosis not present

## 2019-02-13 DIAGNOSIS — Z7982 Long term (current) use of aspirin: Secondary | ICD-10-CM | POA: Insufficient documentation

## 2019-02-13 DIAGNOSIS — R55 Syncope and collapse: Secondary | ICD-10-CM | POA: Insufficient documentation

## 2019-02-13 HISTORY — DX: Irritable bowel syndrome, unspecified: K58.9

## 2019-02-13 HISTORY — DX: Essential (primary) hypertension: I10

## 2019-02-13 LAB — URINALYSIS, COMPLETE (UACMP) WITH MICROSCOPIC
Bacteria, UA: NONE SEEN
Bilirubin Urine: NEGATIVE
Glucose, UA: NEGATIVE mg/dL
Hgb urine dipstick: NEGATIVE
Ketones, ur: 5 mg/dL — AB
Nitrite: NEGATIVE
Protein, ur: NEGATIVE mg/dL
Specific Gravity, Urine: 1.027 (ref 1.005–1.030)
pH: 5 (ref 5.0–8.0)

## 2019-02-13 LAB — BASIC METABOLIC PANEL
Anion gap: 11 (ref 5–15)
BUN: 23 mg/dL (ref 8–23)
CO2: 20 mmol/L — ABNORMAL LOW (ref 22–32)
Calcium: 9.5 mg/dL (ref 8.9–10.3)
Chloride: 107 mmol/L (ref 98–111)
Creatinine, Ser: 1.12 mg/dL — ABNORMAL HIGH (ref 0.44–1.00)
GFR calc Af Amer: >60 mL/min (ref >60)
GFR calc non Af Amer: 53 mL/min — ABNORMAL LOW (ref >60)
Glucose, Bld: 132 mg/dL — ABNORMAL HIGH (ref 70–99)
Potassium: 4.2 mmol/L (ref 3.5–5.1)
Sodium: 138 mmol/L (ref 135–145)

## 2019-02-13 LAB — CBC
HCT: 41.4 % (ref 36.0–46.0)
Hemoglobin: 14 g/dL (ref 12.0–15.0)
MCH: 30.3 pg (ref 26.0–34.0)
MCHC: 33.8 g/dL (ref 30.0–36.0)
MCV: 89.6 fL (ref 80.0–100.0)
Platelets: 306 10*3/uL (ref 150–400)
RBC: 4.62 MIL/uL (ref 3.87–5.11)
RDW: 12.3 % (ref 11.5–15.5)
WBC: 9.3 10*3/uL (ref 4.0–10.5)
nRBC: 0 % (ref 0.0–0.2)

## 2019-02-13 NOTE — Telephone Encounter (Signed)
Copied from [place] [phone]. Topic: General - Other >> Feb 13, 2019  2:00 PM Celene Kras A wrote: Reason for CRM: Pt called stating she had another episode of dizziness, fainting, and vomiting, but this time with a shorter recovery time. Please advise.

## 2019-02-13 NOTE — Discharge Instructions (Signed)
Please keep your scheduled tele-neurology appointment on July 6.  These return to the emergency department for any new symptom of concern if you are unable to see neurology or your primary care provider right away.  Please do not drive until you are cleared to do so either by primary care or neurology.

## 2019-02-13 NOTE — Telephone Encounter (Signed)
Told pt she needs to go to ER for evaluation

## 2019-02-13 NOTE — ED Triage Notes (Signed)
Patient c/o syncopal episode last night 02/12/19 while drying off sitting on her tub edge. She reports she hit her head and had two episodes of emesis. Her family heard a thump and found her in bathroom. Also reports she had a similar episode a couple weeks ago and was seen here but left before getting a room to see a ER MD.

## 2019-02-13 NOTE — ED Notes (Signed)
RN called CT due to delayed results. CT reports they will look into the delay.

## 2019-02-15 NOTE — ED Provider Notes (Signed)
[facility] Emergency Department Provider Note  ___________________________________________   First MD Initiated Contact with Patient 02/13/19 1829     (approximate)  I have reviewed the triage vital signs and the nursing notes.   HISTORY  Chief Complaint Loss of Consciousness  HPI Jenna Meyers is a 63 y.o. female who presents to the emergency department for treatment and evaluation  after having a syncopal episode last night.  She had taken a bath because of her chronic back pain and while sitting on the side of the tub to drive golf, she felt faint.  She slid herself back into the tub and then passed out.  She states that she subsequently vomited and then woke up vomited again and then felt totally fine.  She states that she has had a couple of these episodes where she sees some type of visual aura and then passes out, vomits, and then she is fine again.  She has never had any issues similar to this until recently.  She states that she feels fine today, but was concerned enough to come in for evaluation.    Past Medical History:  Diagnosis Date  . Anxiety 10/09/2012  . Aortic insufficiency 03/19/2018   ECHO July 2019  . Arthritis    back  . Depression   . Emphysema lung ([place])   . Fibromyalgia   . Grade I diastolic dysfunction 2/48/2500   ECHO July 2019  . Hyperlipidemia   . Hypertension   . IBS (irritable bowel syndrome)   . Lumbago   . Lumbar canal stenosis 09/14/2017   Moderate to severe, MRI Nov 2018  . LVH (left ventricular hypertrophy) 03/19/2018   ECHO July 2019  . Mitral regurgitation 03/19/2018   ECHO July 2019  . Tobacco abuse   . Vitamin D deficiency disease     Patient Active Problem List   Diagnosis Date Noted  . Morbid obesity ([place]) 04/14/2018  . Moderate episode of recurrent major depressive disorder ([place]) 04/14/2018  . Aortic insufficiency 03/19/2018  . LVH (left ventricular hypertrophy) 03/19/2018  . Grade I diastolic  dysfunction 37/11/8887  . Mitral regurgitation 03/19/2018  . Lumbar canal stenosis 09/14/2017  . Emphysema of lung ([place]) 05/31/2017  . Palpitations 05/30/2017  . Chest tightness or pressure 05/30/2017  . Medication monitoring encounter 02/09/2016  . Preventative health care 02/09/2016  . Colon cancer screening 02/09/2016  . Essential hypertension, benign 05/17/2015  . Acute allergic reaction 05/17/2015  . Situational stress 05/17/2015  . Obesity 03/03/2015  . Abnormal thyroid blood test 03/03/2015  . Tobacco abuse 03/03/2015  . Hyperlipidemia   . Lumbago   . Fibromyalgia   . Vitamin D deficiency disease   . Anxiety 10/09/2012    Past Surgical History:  Procedure Laterality Date  . ABDOMINAL HYSTERECTOMY  2000   partial, still has cervix  . CESAREAN SECTION  1996  . LAPAROSCOPIC ABDOMINAL EXPLORATION  1999  . TONSILLECTOMY AND ADENOIDECTOMY  1962    Prior to Admission medications   Medication Sig Start Date End Date Taking? Authorizing Provider  albuterol (PROVENTIL HFA;VENTOLIN HFA) 108 (90 Base) MCG/ACT inhaler INHALE 2 PUFFS BY MOUTH  EVERY 4 HOURS AS NEEDED FOR WHEEZING OR SHORTNESS OF  BREATH 11/06/18   Arnetha Courser, MD  aspirin EC 81 MG tablet Take 1 tablet (81 mg total) by mouth daily. Take at least one hour before naproxen 05/30/17   Lada, Satira Anis, MD  atorvastatin (LIPITOR) 80 MG tablet Take 1 tablet (80  mg total) by mouth daily at 6 PM. 01/23/19   Poulose, Percell BeltElizabeth E, NP  DULoxetine (CYMBALTA) 60 MG capsule Take 1 capsule (60 mg total) by mouth daily. 01/23/19   Poulose, Percell BeltElizabeth E, NP  lisinopril (PRINIVIL,ZESTRIL) 10 MG tablet Take 10 mg by mouth daily.    [provider]  loperamide (IMODIUM) 2 MG capsule Take 1 capsule (2 mg total) by mouth 2 (two) times daily before a meal. 01/23/19   Poulose, Percell BeltElizabeth E, NP  metoprolol succinate (TOPROL-XL) 25 MG 24 hr tablet Take 1 tablet (25 mg total) by mouth daily. for blood pressure 01/23/19   Poulose, Percell BeltElizabeth E,  NP  Turmeric Curcumin 500 MG CAPS Take 500 mg by mouth 2 (two) times daily.     [provider]  varenicline (CHANTIX CONTINUING MONTH PAK) 1 MG tablet Take 1 tablet (1 mg total) by mouth 2 (two) times daily. 01/23/19   Poulose, Percell BeltElizabeth E, NP  varenicline (CHANTIX STARTING MONTH PAK) 0.5 MG X 11 & 1 MG X 42 tablet Take one 0.5 mg tablet by mouth once daily for 3 days, then increase to one 0.5 mg tablet twice daily for 4 days, then increase to one 1 mg tablet twice daily. 01/23/19   Poulose, Percell BeltElizabeth E, NP    Allergies Patient has no known allergies.  Family History  Problem Relation Age of Onset  . Cancer Mother        colon  . Osteoporosis Mother   . Hyperlipidemia Mother   . Alzheimer's disease Father   . Thyroid disease Son   . Heart disease Brother     Social History Social History   Tobacco Use  . Smoking status: Former Smoker    Packs/day: 0.15    Years: 45.00    Pack years: 6.75    Types: Cigarettes    Quit date: 01/20/2019    Years since quitting: 0.0  . Smokeless tobacco: Never Used  . Tobacco comment: been given a Rx for Chantix  Substance Use Topics  . Alcohol use: Yes    Comment: rare  . Drug use: No    Review of Systems  Constitutional: No fever/chills. Eyes: No visual changes. ENT: No sore throat. Cardiovascular: Denies chest pain. Respiratory: Denies shortness of breath. Gastrointestinal: No abdominal pain.  No nausea, positive for vomiting.  No diarrhea.  No constipation. Genitourinary: Negative for dysuria. Musculoskeletal: Positive for chronic back pain Skin: Negative for rash. Neurological: Negative for headaches, focal weakness or numbness. ____________________________________________   PHYSICAL EXAM:  VITAL SIGNS: ED Triage Vitals  Enc Vitals Group     BP 02/13/19 1653 115/62     Pulse Rate 02/13/19 1653 76     Resp 02/13/19 1653 16     Temp 02/13/19 1653 99.2 F (37.3 C)     Temp Source 02/13/19 1653 Oral     SpO2 02/13/19  1653 98 %     Weight 02/13/19 1649 200 lb (90.7 kg)     Height 02/13/19 1649 5\' 4"  (1.626 m)     Head Circumference --      Peak Flow --      Pain Score 02/13/19 1648 3     Pain Loc --      Pain Edu? --      Excl. in GC? --     Constitutional: Alert and oriented. Well appearing and in no acute distress. Eyes: Conjunctivae are normal. PERRL. EOMI. Head: Atraumatic. Nose: No congestion/rhinnorhea. Mouth/Throat: Mucous membranes are moist.  Oropharynx non-erythematous. Neck: No stridor.   Cardiovascular: Normal rate, regular rhythm. Grossly normal heart sounds.  Good peripheral circulation. Respiratory: Normal respiratory effort.  No retractions. Lungs CTAB. Gastrointestinal: Soft and nontender. No distention. No abdominal bruits. No CVA tenderness. Musculoskeletal: No lower extremity tenderness nor edema.  No joint effusions. Neurologic:  Normal speech and language. No gross focal neurologic deficits are appreciated. No gait instability. Skin:  Skin is warm, dry and intact. No rash noted. Psychiatric: Mood and affect are normal. Speech and behavior are normal.  ____________________________________________   LABS (all labs ordered are listed, but only abnormal results are displayed)  Labs Reviewed  BASIC METABOLIC PANEL - Abnormal; Notable for the following components:      Result Value   CO2 20 (*)    Glucose, Bld 132 (*)    Creatinine, Ser 1.12 (*)    GFR calc non Af Amer 53 (*)    All other components within normal limits  URINALYSIS, COMPLETE (UACMP) WITH MICROSCOPIC - Abnormal; Notable for the following components:   Color, Urine YELLOW (*)    APPearance CLOUDY (*)    Ketones, ur 5 (*)    Leukocytes,Ua SMALL (*)    All other components within normal limits  CBC   ____________________________________________  EKG  ED ECG REPORT I, Ryu Cerreta, FNP-BC personally viewed and interpreted this ECG.   Date: 02/13/2019  EKG Time: 1642  Rate: 69  Rhythm: normal EKG,  normal sinus rhythm  Axis: normal  Intervals:none  ST&T Change: no ST elevation  ____________________________________________  RADIOLOGY  ED MD interpretation:    CT head shows no acute abnormality per radiology.  Official radiology report(s): No results found.  ____________________________________________   PROCEDURES  Procedure(s) performed: None  Procedures  Critical Care performed: No  ____________________________________________   INITIAL IMPRESSION / ASSESSMENT AND PLAN / ED COURSE   63 year old female presents to the emergency department after having 3 separate episodes of syncope with subsequent vomiting and return to normal. Labs and head CT are reassuring as is her neuro exam. She already has an appointment for neurological evaluation.  Patient is to be discharged home with strict ER return precautions. She was strongly advised not to drive until she is cleared by neurology. Patient agrees and states that her family who live with her can help with whatever she needs to do.      ____________________________________________   FINAL CLINICAL IMPRESSION(S) / ED DIAGNOSES  Final diagnoses:  Syncope, unspecified syncope type     ED Discharge Orders    None       Note:  This document was prepared using Dragon voice recognition software and may include unintentional dictation errors.    Chinita Pesterriplett, [place] Lemaster B, FNP 02/17/19 1231    Minna AntisPaduchowski, Kevin, MD 02/24/19 1442

## 2019-03-09 DIAGNOSIS — R4020 Unspecified coma: Secondary | ICD-10-CM

## 2019-03-09 DIAGNOSIS — R441 Visual hallucinations: Secondary | ICD-10-CM | POA: Insufficient documentation

## 2019-03-09 DIAGNOSIS — R42 Dizziness and giddiness: Secondary | ICD-10-CM | POA: Insufficient documentation

## 2019-03-09 HISTORY — DX: Unspecified coma: R40.20

## 2019-04-14 DIAGNOSIS — R569 Unspecified convulsions: Secondary | ICD-10-CM | POA: Insufficient documentation

## 2019-04-25 ENCOUNTER — Ambulatory Visit: Payer: Medicare Other

## 2019-05-02 ENCOUNTER — Encounter: Payer: Medicare Other | Admitting: Family Medicine

## 2019-05-08 ENCOUNTER — Other Ambulatory Visit: Payer: Self-pay

## 2019-05-08 ENCOUNTER — Ambulatory Visit (INDEPENDENT_AMBULATORY_CARE_PROVIDER_SITE_OTHER): Payer: Medicare Other

## 2019-05-08 VITALS — Ht 64.0 in | Wt 200.0 lb

## 2019-05-08 DIAGNOSIS — Z0000 Encounter for general adult medical examination without abnormal findings: Secondary | ICD-10-CM

## 2019-05-08 DIAGNOSIS — Z1211 Encounter for screening for malignant neoplasm of colon: Secondary | ICD-10-CM

## 2019-05-08 NOTE — Patient Instructions (Signed)
Jenna Meyers , Thank you for taking time to come for your Medicare Wellness Visit. I appreciate your ongoing commitment to your health goals. Please review the following plan we discussed and let me know if I can assist you in the future.   Screening recommendations/referrals: Colonoscopy: Referral placed to GI today Mammogram: done 03/14/18. Please call to reschedule your mammogram at [phone]  Recommended yearly ophthalmology/optometry visit for glaucoma screening and checkup Recommended yearly dental visit for hygiene and checkup  Vaccinations: Influenza vaccine: done 04/04/18 Tdap vaccine: done 09/03/15 Shingles vaccine: Shingrix discussed, due for second dose.    Advanced directives: Advance directive discussed with you today. I have provided a copy for you to complete at home and have notarized. Once this is complete please bring a copy in to our office so we can scan it into your chart.  Conditions/risks identified: Recommend increasing physical activity   Next appointment: Please follow up in one year for your Medicare Annual Wellness visit.    Preventive Care 40-64 Years, Female Preventive care refers to lifestyle choices and visits with your health care provider that can promote health and wellness. What does preventive care include?  A yearly physical exam. This is also called an annual well check.  Dental exams once or twice a year.  Routine eye exams. Ask your health care provider how often you should have your eyes checked.  Personal lifestyle choices, including:  Daily care of your teeth and gums.  Regular physical activity.  Eating a healthy diet.  Avoiding tobacco and drug use.  Limiting alcohol use.  Practicing safe sex.  Taking low-dose aspirin daily starting at age 25.  Taking vitamin and mineral supplements as recommended by your health care provider. What happens during an annual well check? The services and screenings done by your health care  provider during your annual well check will depend on your age, overall health, lifestyle risk factors, and family history of disease. Counseling  Your health care provider may ask you questions about your:  Alcohol use.  Tobacco use.  Drug use.  Emotional well-being.  Home and relationship well-being.  Sexual activity.  Eating habits.  Work and work Statistician.  Method of birth control.  Menstrual cycle.  Pregnancy history. Screening  You may have the following tests or measurements:  Height, weight, and BMI.  Blood pressure.  Lipid and cholesterol levels. These may be checked every 5 years, or more frequently if you are over 26 years old.  Skin check.  Lung cancer screening. You may have this screening every year starting at age 12 if you have a 30-pack-year history of smoking and currently smoke or have quit within the past 15 years.  Fecal occult blood test (FOBT) of the stool. You may have this test every year starting at age 29.  Flexible sigmoidoscopy or colonoscopy. You may have a sigmoidoscopy every 5 years or a colonoscopy every 10 years starting at age 21.  Hepatitis C blood test.  Hepatitis B blood test.  Sexually transmitted disease (STD) testing.  Diabetes screening. This is done by checking your blood sugar (glucose) after you have not eaten for a while (fasting). You may have this done every 1-3 years.  Mammogram. This may be done every 1-2 years. Talk to your health care provider about when you should start having regular mammograms. This may depend on whether you have a family history of breast cancer.  BRCA-related cancer screening. This may be done if you have a family history  of breast, ovarian, tubal, or peritoneal cancers.  Pelvic exam and Pap test. This may be done every 3 years starting at age 14. Starting at age 41, this may be done every 5 years if you have a Pap test in combination with an HPV test.  Bone density scan. This is done  to screen for osteoporosis. You may have this scan if you are at high risk for osteoporosis. Discuss your test results, treatment options, and if necessary, the need for more tests with your health care provider. Vaccines  Your health care provider may recommend certain vaccines, such as:  Influenza vaccine. This is recommended every year.  Tetanus, diphtheria, and acellular pertussis (Tdap, Td) vaccine. You may need a Td booster every 10 years.  Zoster vaccine. You may need this after age 20.  Pneumococcal 13-valent conjugate (PCV13) vaccine. You may need this if you have certain conditions and were not previously vaccinated.  Pneumococcal polysaccharide (PPSV23) vaccine. You may need one or two doses if you smoke cigarettes or if you have certain conditions. Talk to your health care provider about which screenings and vaccines you need and how often you need them. This information is not intended to replace advice given to you by your health care provider. Make sure you discuss any questions you have with your health care provider. Document Released: 09/03/2015 Document Revised: 04/26/2016 Document Reviewed: 06/08/2015 Elsevier Interactive Patient Education  2017 [place] Prevention in the Home Falls can cause injuries. They can happen to people of all ages. There are many things you can do to make your home safe and to help prevent falls. What can I do on the outside of my home?  Regularly fix the edges of walkways and driveways and fix any cracks.  Remove anything that might make you trip as you walk through a door, such as a raised step or threshold.  Trim any bushes or trees on the path to your home.  Use bright outdoor lighting.  Clear any walking paths of anything that might make someone trip, such as rocks or tools.  Regularly check to see if handrails are loose or broken. Make sure that both sides of any steps have handrails.  Any raised decks and porches  should have guardrails on the edges.  Have any leaves, snow, or ice cleared regularly.  Use sand or salt on walking paths during winter.  Clean up any spills in your garage right away. This includes oil or grease spills. What can I do in the bathroom?  Use night lights.  Install grab bars by the toilet and in the tub and shower. Do not use towel bars as grab bars.  Use non-skid mats or decals in the tub or shower.  If you need to sit down in the shower, use a plastic, non-slip stool.  Keep the floor dry. Clean up any water that spills on the floor as soon as it happens.  Remove soap buildup in the tub or shower regularly.  Attach bath mats securely with double-sided non-slip rug tape.  Do not have throw rugs and other things on the floor that can make you trip. What can I do in the bedroom?  Use night lights.  Make sure that you have a light by your bed that is easy to reach.  Do not use any sheets or blankets that are too big for your bed. They should not hang down onto the floor.  Have a firm chair that  has side arms. You can use this for support while you get dressed.  Do not have throw rugs and other things on the floor that can make you trip. What can I do in the kitchen?  Clean up any spills right away.  Avoid walking on wet floors.  Keep items that you use a lot in easy-to-reach places.  If you need to reach something above you, use a strong step stool that has a grab bar.  Keep electrical cords out of the way.  Do not use floor polish or wax that makes floors slippery. If you must use wax, use non-skid floor wax.  Do not have throw rugs and other things on the floor that can make you trip. What can I do with my stairs?  Do not leave any items on the stairs.  Make sure that there are handrails on both sides of the stairs and use them. Fix handrails that are broken or loose. Make sure that handrails are as long as the stairways.  Check any carpeting to  make sure that it is firmly attached to the stairs. Fix any carpet that is loose or worn.  Avoid having throw rugs at the top or bottom of the stairs. If you do have throw rugs, attach them to the floor with carpet tape.  Make sure that you have a light switch at the top of the stairs and the bottom of the stairs. If you do not have them, ask someone to add them for you. What else can I do to help prevent falls?  Wear shoes that:  Do not have high heels.  Have rubber bottoms.  Are comfortable and fit you well.  Are closed at the toe. Do not wear sandals.  If you use a stepladder:  Make sure that it is fully opened. Do not climb a closed stepladder.  Make sure that both sides of the stepladder are locked into place.  Ask someone to hold it for you, if possible.  Clearly mark and make sure that you can see:  Any grab bars or handrails.  First and last steps.  Where the edge of each step is.  Use tools that help you move around (mobility aids) if they are needed. These include:  Canes.  Walkers.  Scooters.  Crutches.  Turn on the lights when you go into a dark area. Replace any light bulbs as soon as they burn out.  Set up your furniture so you have a clear path. Avoid moving your furniture around.  If any of your floors are uneven, fix them.  If there are any pets around you, be aware of where they are.  Review your medicines with your doctor. Some medicines can make you feel dizzy. This can increase your chance of falling. Ask your doctor what other things that you can do to help prevent falls. This information is not intended to replace advice given to you by your health care provider. Make sure you discuss any questions you have with your health care provider. Document Released: 06/03/2009 Document Revised: 01/13/2016 Document Reviewed: 09/11/2014 Elsevier Interactive Patient Education  2017 [employer].

## 2019-05-08 NOTE — Addendum Note (Signed)
Addended by: Clemetine Marker D on: 05/08/2019 02:57 PM   Modules accepted: Orders

## 2019-05-08 NOTE — Progress Notes (Addendum)
Subjective:   Jenna Meyers is a 63 y.o. female who presents for Medicare Annual (Subsequent) preventive examination.  Virtual Visit via Telephone Note  I connected with Jenna Meyers on 05/08/19 at  1:30 PM EDT by telephone and verified that I am speaking with the correct person using two identifiers.  Medicare Annual Wellness visit completed telephonically due to Covid-19 pandemic.   Location: Patient: home Provider: office   I discussed the limitations, risks, security and privacy concerns of performing an evaluation and management service by telephone and the availability of in person appointments. The patient expressed understanding and agreed to proceed.  Some vital signs may be absent or patient reported.   Reather Littler, LPN    Review of Systems:   Cardiac Risk Factors include: dyslipidemia;hypertension;obesity (BMI >30kg/m2)     Objective:     Vitals: Ht 5\' 4"  (1.626 m)   Wt 200 lb (90.7 kg)   BMI 34.33 kg/m   Body mass index is 34.33 kg/m.  Advanced Directives 05/08/2019 02/13/2019 01/06/2019 04/23/2018 05/30/2017 05/30/2017 04/19/2017  Does Patient Have a Medical Advance Directive? No No No No No No No  Would patient like information on creating a medical advance directive? Yes (MAU/Ambulatory/Procedural Areas - Information given) No - Patient declined No - Patient declined Yes (MAU/Ambulatory/Procedural Areas - Information given) Yes (Inpatient - patient requests chaplain consult to create a medical advance directive) - Yes (ED - Information included in AVS)    Tobacco Social History   Tobacco Use  Smoking Status Former Smoker  . Packs/day: 0.15  . Years: 45.00  . Pack years: 6.75  . Types: Cigarettes  . Quit date: 01/20/2019  . Years since quitting: 0.2  Smokeless Tobacco Never Used  Tobacco Comment   been given a Rx for Chantix     Counseling given: Not Answered Comment: been given a Rx for Chantix   Clinical Intake:  Pre-visit preparation  completed: Yes  Pain : 0-10 Pain Score: 2  Pain Type: Chronic pain(arthritis) Pain Location: Back(hands, hips, shoulders) Pain Descriptors / Indicators: Aching, Discomfort Pain Onset: More than a month ago Pain Frequency: Intermittent     BMI - recorded: 34.33 Nutritional Status: BMI > 30  Obese Nutritional Risks: None Diabetes: No  How often do you need to have someone help you when you read instructions, pamphlets, or other written materials from your doctor or pharmacy?: 1 - Never  Interpreter Needed?: No  Information entered by :: Reather Littler LPN  Past Medical History:  Diagnosis Date  . Anxiety 10/09/2012  . Aortic insufficiency 03/19/2018   ECHO July 2019  . Arthritis    back  . Depression   . Emphysema lung (HCC)   . Emphysema of lung (HCC)   . Fibromyalgia   . Grade I diastolic dysfunction 03/19/2018   ECHO July 2019  . Hyperlipidemia   . Hypertension   . IBS (irritable bowel syndrome)   . Lumbago   . Lumbar canal stenosis 09/14/2017   Moderate to severe, MRI Nov 2018  . LVH (left ventricular hypertrophy) 03/19/2018   ECHO July 2019  . Mitral regurgitation 03/19/2018   ECHO July 2019  . Tobacco abuse   . Vitamin D deficiency disease    Past Surgical History:  Procedure Laterality Date  . ABDOMINAL HYSTERECTOMY  2000   partial, still has cervix  . CESAREAN SECTION  1996  . LAPAROSCOPIC ABDOMINAL EXPLORATION  1999  . TONSILLECTOMY AND ADENOIDECTOMY  1962   Family History  Problem  Relation Age of Onset  . Cancer Mother        colon  . Osteoporosis Mother   . Hyperlipidemia Mother   . Alzheimer's disease Father   . Thyroid disease Son   . Heart disease Brother    Social History   Socioeconomic History  . Marital status: Divorced    Spouse name: Not on file  . Number of children: 1  . Years of education: Not on file  . Highest education level: Associate degree: academic program  Occupational History  . Occupation: Disabled  Social Needs  .  Financial resource strain: Somewhat hard  . Food insecurity    Worry: Never true    Inability: Never true  . Transportation needs    Medical: No    Non-medical: No  Tobacco Use  . Smoking status: Former Smoker    Packs/day: 0.15    Years: 45.00    Pack years: 6.75    Types: Cigarettes    Quit date: 01/20/2019    Years since quitting: 0.2  . Smokeless tobacco: Never Used  . Tobacco comment: been given a Rx for Chantix  Substance and Sexual Activity  . Alcohol use: Yes    Comment: rare  . Drug use: No  . Sexual activity: Not Currently    Partners: Male  Lifestyle  . Physical activity    Days per week: 0 days    Minutes per session: 0 min  . Stress: To some extent  Relationships  . Social connections    Talks on phone: More than three times a week    Gets together: Once a week    Attends religious service: Never    Active member of club or organization: No    Attends meetings of clubs or organizations: Never    Relationship status: Divorced  Other Topics Concern  . Not on file  Social History Narrative  . Not on file    Outpatient Encounter Medications as of 05/08/2019  Medication Sig  . albuterol (PROVENTIL HFA;VENTOLIN HFA) 108 (90 Base) MCG/ACT inhaler INHALE 2 PUFFS BY MOUTH  EVERY 4 HOURS AS NEEDED FOR WHEEZING OR SHORTNESS OF  BREATH  . aspirin EC 81 MG tablet Take 1 tablet (81 mg total) by mouth daily. Take at least one hour before naproxen  . atorvastatin (LIPITOR) 80 MG tablet Take 1 tablet (80 mg total) by mouth daily at 6 PM.  . Cholecalciferol (VITAMIN D3) 125 MCG (5000 UT) CAPS Take by mouth. Pt takes once a week  . DULoxetine (CYMBALTA) 60 MG capsule Take 1 capsule (60 mg total) by mouth daily.  Marland Kitchen. loperamide (IMODIUM) 2 MG capsule Take 1 capsule (2 mg total) by mouth 2 (two) times daily before a meal.  . metoprolol succinate (TOPROL-XL) 25 MG 24 hr tablet Take 1 tablet (25 mg total) by mouth daily. for blood pressure  . Turmeric Curcumin 500 MG CAPS Take 500  mg by mouth 2 (two) times daily.   . varenicline (CHANTIX CONTINUING MONTH PAK) 1 MG tablet Take 1 tablet (1 mg total) by mouth 2 (two) times daily.  . [DISCONTINUED] lisinopril (PRINIVIL,ZESTRIL) 10 MG tablet Take 10 mg by mouth daily.  . [DISCONTINUED] varenicline (CHANTIX STARTING MONTH PAK) 0.5 MG X 11 & 1 MG X 42 tablet Take one 0.5 mg tablet by mouth once daily for 3 days, then increase to one 0.5 mg tablet twice daily for 4 days, then increase to one 1 mg tablet twice daily.   No  facility-administered encounter medications on file as of 05/08/2019.     Activities of Daily Living In your present state of health, do you have any difficulty performing the following activities: 05/08/2019 01/23/2019  Hearing? N N  Comment declines hearing aids -  Vision? N N  Difficulty concentrating or making decisions? N N  Walking or climbing stairs? N N  Dressing or bathing? N N  Doing errands, shopping? N N  Preparing Food and eating ? N -  Using the Toilet? N -  In the past six months, have you accidently leaked urine? N -  Do you have problems with loss of bowel control? N -  Managing your Medications? N -  Managing your Finances? N -  Housekeeping or managing your Housekeeping? N -  Some recent data might be hidden    Patient Care Team: Lada, Satira Anis, MD as PCP - General (Family Medicine) Dionisio David, MD as Consulting Physician (Cardiology)    Assessment:   This is a routine wellness examination for [facility].  Exercise Activities and Dietary recommendations Current Exercise Habits: The patient does not participate in regular exercise at present, Exercise limited by: orthopedic condition(s)(arthritis)  Goals    . DIET - INCREASE WATER INTAKE     Recommend to drink at least 6-8 8oz glasses of water per day.       Fall Risk Fall Risk  05/08/2019 01/23/2019 01/09/2019 04/29/2018 04/23/2018  Falls in the past year? 0 0 0 No No  Number falls in past yr: 0 - 0 - -  Injury with Fall? 0 -  0 - -  Risk for fall due to : - - - - Impaired vision;Medication side effect  Risk for fall due to: Comment - - - - wears eyeglasses  Follow up Falls prevention discussed - - - -   FALL RISK PREVENTION PERTAINING TO THE HOME:  Any stairs in or around the home? No  If so, do they handrails? No   Home free of loose throw rugs in walkways, pet beds, electrical cords, etc? Yes  Adequate lighting in your home to reduce risk of falls? Yes   ASSISTIVE DEVICES UTILIZED TO PREVENT FALLS:  Life alert? No  Use of a cane, walker or w/c? No  Grab bars in the bathroom? Yes  Shower chair or bench in shower? No  Elevated toilet seat or a handicapped toilet? No   DME ORDERS:  DME order needed?  No   TIMED UP AND GO:  Was the test performed? No . Telephonic visit.   Education: Fall risk prevention has been discussed.  Intervention(s) required? No   Depression Screen PHQ 2/9 Scores 05/08/2019 01/23/2019 01/09/2019 04/29/2018  PHQ - 2 Score 2 0 1 1  PHQ- 9 Score 6 0 - 5     Cognitive Function     6CIT Screen 05/08/2019 04/23/2018  What Year? 0 points 0 points  What month? 0 points 0 points  What time? 0 points 0 points  Count back from 20 0 points 0 points  Months in reverse 0 points 0 points  Repeat phrase 0 points 0 points  Total Score 0 0    Immunization History  Administered Date(s) Administered  . Influenza Inj Mdck Quad Pf 06/14/2017  . Influenza,inj,Quad PF,6+ Mos 04/04/2018  . Tdap 09/03/2015  . Zoster 08/21/2013  . Zoster Recombinat (Shingrix) 04/04/2018    Qualifies for Shingles Vaccine? Yes  Zostavax completed 2015. Due for second dose of Shingrix.  Tdap: Up to date   Flu Vaccine: Due for Flu vaccine. Does the patient want to receive this vaccine today?  No . Education has been provided regarding the importance of this vaccine but still declined. Advised may receive this vaccine at local pharmacy or Health Dept. Aware to provide a copy of the vaccination record if  obtained from local pharmacy or Health Dept. Verbalized acceptance and understanding.  Pneumococcal Vaccine: Due for Pneumococcal vaccine at age 63.   Screening Tests Health Maintenance  Topic Date Due  . HIV Screening  06/27/1972  . Fecal DNA (Cologuard)  06/28/2007  . MAMMOGRAM  03/15/2019  . INFLUENZA VACCINE  03/22/2019  . PAP SMEAR-Modifier  04/29/2021  . TETANUS/TDAP  09/02/2025  . Hepatitis C Screening  Completed    Cancer Screenings:  Colorectal Screening: Not completed. Referral to GI placed today. Pt aware the office will call re: appt.  Mammogram: Completed 03/14/18. Repeat every year. Pt was scheduled for appt earlier in the year for mammo but postponed due to Covid, pt aware to call and reschedule.   Lung Cancer Screening: (Low Dose CT Chest recommended if Age 75-80 years, 30 pack-year currently smoking OR have quit w/in 15years.) does not qualify.    Additional Screening:  Hepatitis C Screening: does qualify; Completed 09/03/15  Vision Screening: Recommended annual ophthalmology exams for early detection of glaucoma and other disorders of the eye. Is the patient up to date with their annual eye exam?  No  Who is the provider or what is the name of the office in which the pt attends annual eye exams? Dr. Larence PenningNice  Dental Screening: Recommended annual dental exams for proper oral hygiene  Community Resource Referral:  CRR required this visit?  No      Plan:      I have personally reviewed and addressed the Medicare Annual Wellness questionnaire and have noted the following in the patient's chart:  A. Medical and social history B. Use of alcohol, tobacco or illicit drugs  C. Current medications and supplements D. Functional ability and status E.  Nutritional status F.  Physical activity G. Advance directives H. List of other physicians I.  Hospitalizations, surgeries, and ER visits in previous 12 months J.  Vitals K. Screenings such as hearing and vision if  needed, cognitive and depression L. Referrals and appointments   In addition, I have reviewed and discussed with patient certain preventive protocols, quality metrics, and best practice recommendations. A written personalized care plan for preventive services as well as general preventive health recommendations were provided to patient.   Signed,  Reather LittlerKasey Becky Colan, LPN Nurse Health Advisor   Nurse Notes: advised pt due for follow up around 07/22/19.

## 2019-06-04 ENCOUNTER — Encounter: Payer: Self-pay | Admitting: *Deleted

## 2019-06-09 ENCOUNTER — Other Ambulatory Visit: Payer: Self-pay

## 2019-06-09 DIAGNOSIS — M797 Fibromyalgia: Secondary | ICD-10-CM

## 2019-06-09 DIAGNOSIS — F331 Major depressive disorder, recurrent, moderate: Secondary | ICD-10-CM

## 2019-06-09 MED ORDER — DULOXETINE HCL 60 MG PO CPEP: 60.0000 mg | ORAL_CAPSULE | Freq: Every day | ORAL | 1 refills | Status: DC

## 2019-07-04 ENCOUNTER — Ambulatory Visit: Payer: Self-pay

## 2019-07-31 ENCOUNTER — Telehealth: Payer: Self-pay

## 2019-07-31 NOTE — Telephone Encounter (Signed)
Copied from [place] [id number]. Topic: General - Other >> Jul 31, 2019  4:23 PM Jenna Meyers wrote: Reason for CRM: pt called in she would like assistance, pt says that she and Dr Sanda Klein discussed strength changes for medication due to arthritis. Pt would like to know if a provider would change DULoxetine (CYMBALTA) to 120 MG capsule instead ? Pt says that she has enough of her current Rx to increase dosage if advised okay to.   Advised pt that she also need to establish care with a different provider, she stated that she will but would like to be advised meanwhile if possible.   CB: [phone]

## 2019-08-01 ENCOUNTER — Encounter: Payer: Self-pay | Admitting: Family Medicine

## 2019-08-01 ENCOUNTER — Ambulatory Visit (INDEPENDENT_AMBULATORY_CARE_PROVIDER_SITE_OTHER): Payer: Medicare Other | Admitting: Family Medicine

## 2019-08-01 ENCOUNTER — Other Ambulatory Visit: Payer: Self-pay

## 2019-08-01 VITALS — HR 76 | Ht 64.0 in | Wt 200.0 lb

## 2019-08-01 DIAGNOSIS — G8929 Other chronic pain: Secondary | ICD-10-CM

## 2019-08-01 DIAGNOSIS — M25559 Pain in unspecified hip: Secondary | ICD-10-CM | POA: Diagnosis not present

## 2019-08-01 DIAGNOSIS — N183 Chronic kidney disease, stage 3 unspecified: Secondary | ICD-10-CM

## 2019-08-01 DIAGNOSIS — M5136 Other intervertebral disc degeneration, lumbar region: Secondary | ICD-10-CM

## 2019-08-01 DIAGNOSIS — F331 Major depressive disorder, recurrent, moderate: Secondary | ICD-10-CM

## 2019-08-01 DIAGNOSIS — J439 Emphysema, unspecified: Secondary | ICD-10-CM

## 2019-08-01 DIAGNOSIS — M48061 Spinal stenosis, lumbar region without neurogenic claudication: Secondary | ICD-10-CM

## 2019-08-01 DIAGNOSIS — E782 Mixed hyperlipidemia: Secondary | ICD-10-CM

## 2019-08-01 DIAGNOSIS — F419 Anxiety disorder, unspecified: Secondary | ICD-10-CM

## 2019-08-01 DIAGNOSIS — E559 Vitamin D deficiency, unspecified: Secondary | ICD-10-CM

## 2019-08-01 DIAGNOSIS — M545 Low back pain: Secondary | ICD-10-CM

## 2019-08-01 DIAGNOSIS — Z1231 Encounter for screening mammogram for malignant neoplasm of breast: Secondary | ICD-10-CM

## 2019-08-01 DIAGNOSIS — Z5181 Encounter for therapeutic drug level monitoring: Secondary | ICD-10-CM

## 2019-08-01 DIAGNOSIS — M797 Fibromyalgia: Secondary | ICD-10-CM | POA: Diagnosis not present

## 2019-08-01 DIAGNOSIS — I10 Essential (primary) hypertension: Secondary | ICD-10-CM

## 2019-08-01 DIAGNOSIS — R739 Hyperglycemia, unspecified: Secondary | ICD-10-CM

## 2019-08-01 MED ORDER — DULOXETINE HCL 60 MG PO CPEP: 60.0000 mg | ORAL_CAPSULE | Freq: Two times a day (BID) | ORAL | 1 refills | Status: DC

## 2019-08-01 NOTE — Progress Notes (Signed)
Name: Jenna Meyers   MRN: [id number]    DOB: 1955-12-10   Date:08/01/2019       Progress Note  Subjective:    Chief Complaint  Chief Complaint  Patient presents with  . Fibromyalgia    discuss increasing dose for cymbalta    I connected with  Kathrin Penner  on 08/01/19 at 11:20 AM EST by a video enabled telemedicine application and verified that I am speaking with the correct person using two identifiers.  I discussed the limitations of evaluation and management by telemedicine and the availability of in person appointments. The patient expressed understanding and agreed to proceed. Staff also discussed with the patient that there may be a patient responsible charge related to this service. Patient Location: home Provider Location: [facility] clinic Additional Individuals present: none  HPI  Pt presents with hx of fibromyalgia and wants to increase cymbalta dose.  She was previously on 150 mg for years.   Her arthritis pain in hips shoulders and hands have been gradually been worsening.  She also becomes tearful and asks for increase in med dose due to worsening anxiety and depression.  She is feeling very isolated and stressed with COVID pandemic.  She is supporting her family which is hard for her.  She was hoping increasing cymbalta would help. Depression screen [facility] 2/9 08/01/2019 05/08/2019 01/23/2019  Decreased Interest 1 1 0  Down, Depressed, Hopeless 1 1 0  PHQ - 2 Score 2 2 0  Altered sleeping 1 3 0  Tired, decreased energy 1 0 0  Change in appetite 0 1 0  Feeling bad or failure about yourself  0 0 0  Trouble concentrating 0 0 0  Moving slowly or fidgety/restless 0 0 0  Suicidal thoughts 0 0 0  PHQ-9 Score 4 6 0  Difficult doing work/chores Somewhat difficult - Not difficult at all  PHQ reviewed, positive with score of 4 - increased meds, close f/up   She has questions about an old MRI - L4-L5, to Sacral spine from 2018 she endorses LBP with radiation to legs, and  recently she has had more back pain and she would like to see a specialist. Last MRI from 2018 reviewed - discussed ortho vs spine specialists - she would prefer ortho due to hip pain and multiple other areas that are hurting.     Patient Active Problem List   Diagnosis Date Noted  . Seizure-like activity (HCC) 04/14/2019  . Dizziness 03/09/2019  . Hallucinations, visual 03/09/2019  . Loss of consciousness (HCC) 03/09/2019  . Morbid obesity (HCC) 04/14/2018  . Moderate episode of recurrent major depressive disorder (HCC) 04/14/2018  . Aortic insufficiency 03/19/2018  . LVH (left ventricular hypertrophy) 03/19/2018  . Grade I diastolic dysfunction 03/19/2018  . Mitral regurgitation 03/19/2018  . Lumbar canal stenosis 09/14/2017  . Emphysema of lung (HCC) 05/31/2017  . Palpitations 05/30/2017  . Chest tightness or pressure 05/30/2017  . Medication monitoring encounter 02/09/2016  . Preventative health care 02/09/2016  . Colon cancer screening 02/09/2016  . Essential hypertension, benign 05/17/2015  . Acute allergic reaction 05/17/2015  . Situational stress 05/17/2015  . Obesity 03/03/2015  . Abnormal thyroid blood test 03/03/2015  . Tobacco abuse 03/03/2015  . Hyperlipidemia   . Lumbago   . Fibromyalgia   . Vitamin D deficiency disease   . Anxiety 10/09/2012    Social History   Tobacco Use  . Smoking status: Former Smoker    Packs/day: 0.15    Years:  45.00    Pack years: 6.75    Types: Cigarettes    Quit date: 01/20/2019    Years since quitting: 0.5  . Smokeless tobacco: Never Used  . Tobacco comment: been given a Rx for Chantix  Substance Use Topics  . Alcohol use: Yes    Comment: rare     Current Outpatient Medications:  .  albuterol (PROVENTIL HFA;VENTOLIN HFA) 108 (90 Base) MCG/ACT inhaler, INHALE 2 PUFFS BY MOUTH  EVERY 4 HOURS AS NEEDED FOR WHEEZING OR SHORTNESS OF  BREATH, Disp: 3 Inhaler, Rfl: 0 .  aspirin EC 81 MG tablet, Take 1 tablet (81 mg total) by  mouth daily. Take at least one hour before naproxen, Disp: , Rfl:  .  atorvastatin (LIPITOR) 80 MG tablet, Take 1 tablet (80 mg total) by mouth daily at 6 PM., Disp: 90 tablet, Rfl: 1 .  Cholecalciferol (VITAMIN D3) 125 MCG (5000 UT) CAPS, Take by mouth. Pt takes once a week, Disp: , Rfl:  .  DULoxetine (CYMBALTA) 60 MG capsule, Take 1 capsule (60 mg total) by mouth daily., Disp: 90 capsule, Rfl: 1 .  metoprolol succinate (TOPROL-XL) 25 MG 24 hr tablet, Take 1 tablet (25 mg total) by mouth daily. for blood pressure, Disp: 90 tablet, Rfl: 1 .  Turmeric Curcumin 500 MG CAPS, Take 500 mg by mouth 2 (two) times daily. , Disp: , Rfl:   No Known Allergies  I personally reviewed active problem list, medication list, allergies, family history, social history, health maintenance, notes from last encounter, lab results, imaging with the patient/caregiver today.  Review of Systems  Constitutional: Negative.   HENT: Negative.   Eyes: Negative.   Respiratory: Negative.   Cardiovascular: Negative.   Gastrointestinal: Negative.   Endocrine: Negative.   Genitourinary: Negative.   Musculoskeletal: Negative.   Skin: Negative.   Allergic/Immunologic: Negative.   Neurological: Negative.   Hematological: Negative.   Psychiatric/Behavioral: Negative.   All other systems reviewed and are negative.    Objective:   Virtual encounter, vitals limited, only able to obtain the following Today's Vitals   08/01/19 1115  Pulse: 76  Weight: 200 lb (90.7 kg)  Height: 5\' 4"  (1.626 m)   Body mass index is 34.33 kg/m. Nursing Note and Vital Signs reviewed.  Physical Exam Vitals and nursing note reviewed.  Constitutional:      General: She is not in acute distress.    Appearance: Normal appearance. She is well-developed. She is not ill-appearing, toxic-appearing or diaphoretic.  HENT:     Head: Normocephalic and atraumatic.     Nose: Nose normal.  Eyes:     General:        Right eye: No discharge.          Left eye: No discharge.     Conjunctiva/sclera: Conjunctivae normal.  Neck:     Trachea: No tracheal deviation.  Pulmonary:     Effort: Pulmonary effort is normal. No respiratory distress.     Breath sounds: No stridor.  Skin:    Findings: No rash.  Neurological:     Mental Status: She is alert.     Motor: No abnormal muscle tone.     Coordination: Coordination normal.  Psychiatric:        Behavior: Behavior normal.     PE limited by telephone encounter  No results found for this or any previous visit (from the past 72 hour(s)).  Assessment and Plan:     ICD-10-CM   1. Fibromyalgia  M79.7 DULoxetine (CYMBALTA) 60 MG capsule   discussed cymbalta dosing, since MDD and anxiety have indication for high dose usage will increase, but discussed how may not help pain, and lyrica may help  2. Moderate episode of recurrent major depressive disorder (HCC)  F33.1 DULoxetine (CYMBALTA) 60 MG capsule   worsening - increase cymbalta dose, close f/up  3. Anxiety disorder, unspecified type  F41.9 DULoxetine (CYMBALTA) 60 MG capsule   worsening - increase cymbalta dose, close f/up  4. Hip pain  M25.559 Ambulatory referral to Orthopedics   chronic, bilateral , worsening over past couple years, she requests ortho referral  5. Chronic low back pain, unspecified back pain laterality, unspecified whether sciatica present  M54.5 Ambulatory referral to Orthopedics   G89.29    generally worse but no worsening of radicular sx, no numbness or weakness to legs  6. DDD (degenerative disc disease), lumbar  M51.36 Ambulatory referral to Orthopedics  7. Foraminal stenosis of lumbar region  M48.061 Ambulatory referral to Orthopedics  8. Morbid obesity (HCC)  E66.01 CBC w/ Diff    CMP w GFR    Lipid Panel    A1C  9. Pulmonary emphysema, unspecified emphysema type (HCC)  J43.9   10. Vitamin D deficiency disease  E55.9 Vit D   recheck labs  11. Essential hypertension, benign  I10 CMP w GFR   due for  labs, f/up visit will discuss  12. Medication monitoring encounter  Z51.81 CBC w/ Diff    CMP w GFR    Lipid Panel    Vit D  13. Mixed hyperlipidemia  E78.2 CBC w/ Diff    CMP w GFR    Lipid Panel   due for labs, f/up visit will discuss  14. Stage 3 chronic kidney disease, unspecified whether stage 3a or 3b CKD  N18.30 CMP w GFR   due for labs, f/up visit will discuss  15. Encounter for screening mammogram for malignant neoplasm of breast  Z12.31 MM 3D SCREEN BREAST BILATERAL  16. Hyperglycemia  R73.9 A1C   due for labs, f/up visit will discuss      - I discussed the assessment and treatment plan with the patient. The patient was provided an opportunity to ask questions and all were answered. The patient agreed with the plan and demonstrated an understanding of the instructions.  I provided 18 minutes of non-face-to-face time during this encounter.  Danelle BerryLeisa Darik Massing, PA-C 08/01/19 11:44 AM

## 2019-08-01 NOTE — Telephone Encounter (Signed)
Made appt today 08-01-2019 at 11:20 today

## 2019-08-12 DIAGNOSIS — E559 Vitamin D deficiency, unspecified: Secondary | ICD-10-CM | POA: Insufficient documentation

## 2019-08-12 DIAGNOSIS — E785 Hyperlipidemia, unspecified: Secondary | ICD-10-CM | POA: Insufficient documentation

## 2019-09-02 ENCOUNTER — Ambulatory Visit: Payer: Medicare Other | Admitting: Family Medicine

## 2019-10-27 ENCOUNTER — Telehealth: Payer: Self-pay | Admitting: Family Medicine

## 2019-10-27 DIAGNOSIS — I10 Essential (primary) hypertension: Secondary | ICD-10-CM

## 2019-10-27 DIAGNOSIS — N6011 Diffuse cystic mastopathy of right breast: Secondary | ICD-10-CM

## 2019-10-27 MED ORDER — METOPROLOL SUCCINATE ER 25 MG PO TB24: 25.0000 mg | ORAL_TABLET | Freq: Every day | ORAL | 0 refills | Status: DC

## 2019-10-27 NOTE — Telephone Encounter (Signed)
Medication Refill - Medication:  metoprolol succinate (TOPROL-XL) 25 MG 24 hr tablet  Has the patient contacted their pharmacy? Yes advised to call.   Preferred Pharmacy (with phone number or street name):  [facility] [address] [place], [place], [place] [address]  Agent: Please be advised that RX refills may take up to 3 business days. We ask that you follow-up with your pharmacy.

## 2019-10-29 NOTE — Telephone Encounter (Signed)
Pt needs diagnostic mammogram f/u

## 2019-10-30 NOTE — Telephone Encounter (Signed)
Lvm ( 10-30-2019 @ 8:40 am )  to get patient to make follow up appt

## 2019-11-03 ENCOUNTER — Ambulatory Visit: Payer: Medicare PPO | Admitting: Family Medicine

## 2019-11-11 ENCOUNTER — Other Ambulatory Visit: Payer: Self-pay | Admitting: Orthopedic Surgery

## 2019-11-11 DIAGNOSIS — M4807 Spinal stenosis, lumbosacral region: Secondary | ICD-10-CM

## 2019-11-24 ENCOUNTER — Ambulatory Visit: Payer: Medicare PPO | Admitting: Family Medicine

## 2019-11-24 ENCOUNTER — Encounter: Payer: Self-pay | Admitting: Family Medicine

## 2019-11-24 ENCOUNTER — Other Ambulatory Visit: Payer: Self-pay

## 2019-11-24 VITALS — BP 108/74 | HR 99 | Temp 98.2°F | Resp 14 | Ht 64.0 in | Wt 204.4 lb

## 2019-11-24 DIAGNOSIS — F331 Major depressive disorder, recurrent, moderate: Secondary | ICD-10-CM

## 2019-11-24 DIAGNOSIS — M797 Fibromyalgia: Secondary | ICD-10-CM

## 2019-11-24 DIAGNOSIS — Z5181 Encounter for therapeutic drug level monitoring: Secondary | ICD-10-CM | POA: Diagnosis not present

## 2019-11-24 DIAGNOSIS — I351 Nonrheumatic aortic (valve) insufficiency: Secondary | ICD-10-CM | POA: Diagnosis not present

## 2019-11-24 DIAGNOSIS — M545 Low back pain, unspecified: Secondary | ICD-10-CM

## 2019-11-24 DIAGNOSIS — E782 Mixed hyperlipidemia: Secondary | ICD-10-CM

## 2019-11-24 DIAGNOSIS — M25559 Pain in unspecified hip: Secondary | ICD-10-CM

## 2019-11-24 DIAGNOSIS — I10 Essential (primary) hypertension: Secondary | ICD-10-CM | POA: Diagnosis not present

## 2019-11-24 DIAGNOSIS — G8929 Other chronic pain: Secondary | ICD-10-CM

## 2019-11-24 DIAGNOSIS — F419 Anxiety disorder, unspecified: Secondary | ICD-10-CM

## 2019-11-24 DIAGNOSIS — M48061 Spinal stenosis, lumbar region without neurogenic claudication: Secondary | ICD-10-CM

## 2019-11-24 MED ORDER — DULOXETINE HCL 60 MG PO CPEP: 60.0000 mg | ORAL_CAPSULE | Freq: Two times a day (BID) | ORAL | 3 refills | Status: DC

## 2019-11-24 MED ORDER — PREGABALIN 25 MG PO CAPS: 25.0000 mg | ORAL_CAPSULE | Freq: Two times a day (BID) | ORAL | 0 refills | Status: DC

## 2019-11-24 MED ORDER — ATORVASTATIN CALCIUM 80 MG PO TABS: 80.0000 mg | ORAL_TABLET | Freq: Every day | ORAL | 0 refills | Status: DC

## 2019-11-24 MED ORDER — ATORVASTATIN CALCIUM 80 MG PO TABS: 80.0000 mg | ORAL_TABLET | Freq: Every day | ORAL | 3 refills | Status: DC

## 2019-11-24 MED ORDER — METOPROLOL SUCCINATE ER 25 MG PO TB24: 25.0000 mg | ORAL_TABLET | Freq: Every day | ORAL | 3 refills | Status: DC

## 2019-11-24 MED ORDER — TIZANIDINE HCL 4 MG PO TABS: 2.0000 mg | ORAL_TABLET | Freq: Three times a day (TID) | ORAL | 2 refills | Status: DC | PRN

## 2019-11-24 MED ORDER — TIZANIDINE HCL 4 MG PO TABS: 2.0000 mg | ORAL_TABLET | Freq: Three times a day (TID) | ORAL | 0 refills | Status: DC | PRN

## 2019-11-24 NOTE — Progress Notes (Signed)
Name: Jenna Meyers   MRN: [id number]    DOB: 09/20/1955   Date:11/24/2019       Progress Note  Chief Complaint  Patient presents with  . Follow-up  . Hyperlipidemia  . Hypertension  . Depression     Subjective:   Jenna PennerKatherine Zelman is a 64 y.o. female, presents to clinic for routine follow up on the conditions listed above.  F/up on fibromyalgia, MDD and other MSK pain after increasing cymbalta and trial of lyrica She feels much better with dose increase in cymbalta and did not try lyrica.    Labs previously ordered but not completed for chronic conditions.  Hyperlipidemia: Current Medication Regimen:   Atorvastatin 80 mg   Last Lipids: Lab Results  Component Value Date   CHOL 188 01/10/2019   HDL 44 (L) 01/10/2019   LDLCALC 114 (H) 01/10/2019   TRIG 181 (H) 01/10/2019   CHOLHDL 4.3 01/10/2019  The 10-year ASCVD risk score Denman George(Goff DC Jr., et al., 2013) is: 4.2%   Values used to calculate the score:     Age: [age] years     Sex: Female     Is Non-Hispanic African American: No     Diabetic: No     Tobacco smoker: No     Systolic Blood Pressure: 108 mmHg     Is BP treated: Yes     HDL Cholesterol: 44 mg/dL     Total Cholesterol: 188 mg/dL - Current Diet:working on healthjy diet, plant-based diet - Denies: Chest pain, shortness of breath, myalgias. - Risk factors for atherosclerosis: hypercholesterolemia and hypertension  Hypertension:  Currently managed on metoprolol 25 mg daily  Pt reports good med compliance and denies any SE.  No lightheadedness, hypotension, syncope. Blood pressure today is well controlled. BP Readings from Last 3 Encounters:  11/24/19 108/74  02/13/19 130/90  01/06/19 (!) 134/92  Pt denies CP, SOB, exertional sx, LE edema, palpitation, Ha's, visual disturbances Dietary efforts for BP?  See above Hx of grade 1 DD, mitral regurt, AI, LVH   Previously discussed at last visit 1. Fibromyalgia  M79.7 DULoxetine (CYMBALTA) 60 MG capsule    discussed cymbalta dosing, since MDD and anxiety have indication for high dose usage will increase, but discussed how may not help pain, and lyrica may help  2. Moderate episode of recurrent major depressive disorder (HCC)  F33.1 DULoxetine (CYMBALTA) 60 MG capsule   worsening - increase cymbalta dose, close f/up  3. Anxiety disorder, unspecified type  F41.9 DULoxetine (CYMBALTA) 60 MG capsule   worsening - increase cymbalta dose, close f/up  4. Hip pain  M25.559 Ambulatory referral to Orthopedics   chronic, bilateral , worsening over past couple years, she requests ortho referral  5. Chronic low back pain, unspecified back pain laterality, unspecified whether sciatica present  M54.5 Ambulatory referral to Orthopedics   G89.29    generally worse but no worsening of radicular sx, no numbness or weakness to legs  6. DDD (degenerative disc disease), lumbar  M51.36 Ambulatory referral to Orthopedics  7. Foraminal stenosis of lumbar region  M48.061 Ambulatory referral to Orthopedics  8. Morbid obesity (HCC)  E66.01 CBC w/ Diff    CMP w GFR    Lipid Panel    A1C  9. Pulmonary emphysema, unspecified emphysema type (HCC)  J43.9   10. Vitamin D deficiency disease  E55.9 Vit D   recheck labs  11. Essential hypertension, benign  I10 CMP w GFR   due for labs, f/up visit will  discuss  12. Medication monitoring encounter  Z51.81 CBC w/ Diff    CMP w GFR    Lipid Panel    Vit D  13. Mixed hyperlipidemia  E78.2 CBC w/ Diff    CMP w GFR    Lipid Panel   due for labs, f/up visit will discuss  14. Stage 3 chronic kidney disease, unspecified whether stage 3a or 3b CKD  N18.30 CMP w GFR   due for labs, f/up visit will discuss  15. Encounter for screening mammogram for malignant neoplasm of breast  Z12.31 MM 3D SCREEN BREAST BILATERAL  16. Hyperglycemia  R73.9 A1C   due for labs, f/up visit will discuss    MDD Depression screen [facility] 2/9 11/24/2019 08/01/2019 05/08/2019    Decreased Interest 0 1 1  Down, Depressed, Hopeless 1 1 1   PHQ - 2 Score 1 2 2   Altered sleeping 0 1 3  Tired, decreased energy 0 1 0  Change in appetite 0 0 1  Feeling bad or failure about yourself  0 0 0  Trouble concentrating 0 0 0  Moving slowly or fidgety/restless 0 0 0  Suicidal thoughts 0 0 0  PHQ-9 Score 1 4 6   Difficult doing work/chores Not difficult at all Somewhat difficult -   She reports that her depressive symptoms are improving with the increase Cymbalta and she feels much better with a little bit better pain control.  She is seeing Ortho for her chronic pain.  Hyperglycemia, vitamin D deficiency and other diagnoses we will follow the same plan as previously noted above, check labs and follow-up and treatment pending results   Patient Active Problem List   Diagnosis Date Noted  . Hyperlipidemia 08/12/2019  . Vitamin D deficiency disease 08/12/2019  . Hallucinations, visual 03/09/2019  . Morbid obesity (HCC) 04/14/2018  . Moderate episode of recurrent major depressive disorder (HCC) 04/14/2018  . Aortic insufficiency 03/19/2018  . LVH (left ventricular hypertrophy) 03/19/2018  . Grade I diastolic dysfunction 03/19/2018  . Mitral regurgitation 03/19/2018  . Lumbar canal stenosis 09/14/2017  . Emphysema of lung (HCC) 05/31/2017  . Palpitations 05/30/2017  . Medication monitoring encounter 02/09/2016  . Essential hypertension, benign 05/17/2015  . Acute allergic reaction 05/17/2015  . Situational stress 05/17/2015  . Obesity 03/03/2015  . Abnormal thyroid blood test 03/03/2015  . Tobacco abuse 03/03/2015  . Lumbago   . Fibromyalgia   . Anxiety 10/09/2012    Past Surgical History:  Procedure Laterality Date  . ABDOMINAL HYSTERECTOMY  2000   partial, still has cervix  . CESAREAN SECTION  1996  . LAPAROSCOPIC ABDOMINAL EXPLORATION  1999  . TONSILLECTOMY AND ADENOIDECTOMY  1962    Family History  Problem Relation Age of Onset  . Cancer Mother         colon  . Osteoporosis Mother   . Hyperlipidemia Mother   . Alzheimer's disease Father   . Thyroid disease Son   . Heart disease Brother     Social History   Tobacco Use  . Smoking status: Former Smoker    Packs/day: 0.15    Years: 45.00    Pack years: 6.75    Types: Cigarettes    Quit date: 01/20/2019    Years since quitting: 0.8  . Smokeless tobacco: Never Used  . Tobacco comment: been given a Rx for Chantix  Substance Use Topics  . Alcohol use: Yes    Comment: rare  . Drug use: No      Current  Outpatient Medications:  .  albuterol (PROVENTIL HFA;VENTOLIN HFA) 108 (90 Base) MCG/ACT inhaler, INHALE 2 PUFFS BY MOUTH  EVERY 4 HOURS AS NEEDED FOR WHEEZING OR SHORTNESS OF  BREATH, Disp: 3 Inhaler, Rfl: 0 .  aspirin EC 81 MG tablet, Take 1 tablet (81 mg total) by mouth daily. Take at least one hour before naproxen, Disp: , Rfl:  .  atorvastatin (LIPITOR) 80 MG tablet, Take 1 tablet (80 mg total) by mouth daily at 6 PM., Disp: 90 tablet, Rfl: 1 .  Cholecalciferol (VITAMIN D3) 125 MCG (5000 UT) CAPS, Take by mouth. Pt takes once a week, Disp: , Rfl:  .  DULoxetine (CYMBALTA) 60 MG capsule, Take 1 capsule (60 mg total) by mouth 2 (two) times daily., Disp: 60 capsule, Rfl: 1 .  metoprolol succinate (TOPROL-XL) 25 MG 24 hr tablet, Take 1 tablet (25 mg total) by mouth daily. for blood pressure, Disp: 90 tablet, Rfl: 0  No Known Allergies  Chart Review Today: I personally reviewed active problem list, medication list, allergies, family history, social history, health maintenance, notes from last encounter, lab results, imaging with the patient/caregiver today.   Review of Systems  10 Systems reviewed and are negative for acute change except as noted in the HPI.  Objective:    Vitals:   11/24/19 1357  BP: 108/74  Pulse: 99  Resp: 14  Temp: 98.2 F (36.8 C)  SpO2: 98%  Weight: 204 lb 6.4 oz (92.7 kg)  Height: 5\' 4"  (1.626 m)    Body mass index is 35.09 kg/m.  Physical  Exam Vitals and nursing note reviewed.  Constitutional:      General: She is not in acute distress.    Appearance: Normal appearance. She is well-developed. She is not ill-appearing, toxic-appearing or diaphoretic.     Interventions: Face mask in place.  HENT:     Head: Normocephalic and atraumatic.     Right Ear: External ear normal.     Left Ear: External ear normal.  Eyes:     General: Lids are normal. No scleral icterus.       Right eye: No discharge.        Left eye: No discharge.     Conjunctiva/sclera: Conjunctivae normal.  Neck:     Trachea: Phonation normal. No tracheal deviation.  Cardiovascular:     Rate and Rhythm: Normal rate and regular rhythm.     Pulses: Normal pulses.          Radial pulses are 2+ on the right side and 2+ on the left side.       Posterior tibial pulses are 2+ on the right side and 2+ on the left side.     Heart sounds: Normal heart sounds. No murmur. No friction rub. No gallop.   Pulmonary:     Effort: Pulmonary effort is normal. No respiratory distress.     Breath sounds: Normal breath sounds. No stridor. No wheezing, rhonchi or rales.  Chest:     Chest wall: No tenderness.  Abdominal:     General: Bowel sounds are normal. There is no distension.     Palpations: Abdomen is soft.     Tenderness: There is no abdominal tenderness. There is no guarding or rebound.  Musculoskeletal:     Cervical back: Normal range of motion and neck supple.     Right lower leg: No edema.     Left lower leg: No edema.  Lymphadenopathy:     Cervical: No cervical adenopathy.  Skin:    General: Skin is warm and dry.     Capillary Refill: Capillary refill takes less than 2 seconds.     Coloration: Skin is not jaundiced or pale.     Findings: No rash.  Neurological:     Mental Status: She is alert.     Motor: No abnormal muscle tone.     Gait: Gait abnormal.  Psychiatric:        Speech: Speech normal.        Behavior: Behavior normal.        PHQ2/9: Depression screen [facility] 2/9 11/24/2019 08/01/2019 05/08/2019 01/23/2019 01/09/2019  Decreased Interest 0 1 1 0 0  Down, Depressed, Hopeless 1 1 1  0 1  PHQ - 2 Score 1 2 2  0 1  Altered sleeping 0 1 3 0 -  Tired, decreased energy 0 1 0 0 -  Change in appetite 0 0 1 0 -  Feeling bad or failure about yourself  0 0 0 0 -  Trouble concentrating 0 0 0 0 -  Moving slowly or fidgety/restless 0 0 0 0 -  Suicidal thoughts 0 0 0 0 -  PHQ-9 Score 1 4 6  0 -  Difficult doing work/chores Not difficult at all Somewhat difficult - Not difficult at all -    phq 9 is neg reviewed  Fall Risk: Fall Risk  11/24/2019 08/01/2019 05/08/2019 01/23/2019 01/09/2019  Falls in the past year? 0 0 0 0 0  Number falls in past yr: 0 0 0 - 0  Injury with Fall? 0 0 0 - 0  Risk for fall due to : - - - - -  Risk for fall due to: Comment - - - - -  Follow up - - Falls prevention discussed - -    Functional Status Survey: Is the patient deaf or have difficulty hearing?: No Does the patient have difficulty seeing, even when wearing glasses/contacts?: No Does the patient have difficulty concentrating, remembering, or making decisions?: No Does the patient have difficulty walking or climbing stairs?: No Does the patient have difficulty dressing or bathing?: No Does the patient have difficulty doing errands alone such as visiting a doctor's office or shopping?: No   Assessment & Plan:   1. Mixed hyperlipidemia Labs will be done today, med refilled, tolerating medications no side effects or concerns, encouraged healthy diet and when working with Ortho and addressing her chronic pain encouraged her to exercise and remain active as able with her Ortho limitations - atorvastatin (LIPITOR) 80 MG tablet; Take 1 tablet (80 mg total) by mouth daily at 6 PM.  Dispense: 90 tablet; Refill: 3  2. Essential hypertension, benign Well-controlled, medications refilled, labs previously ordered will be completed today - metoprolol  succinate (TOPROL-XL) 25 MG 24 hr tablet; Take 1 tablet (25 mg total) by mouth daily. for blood pressure  Dispense: 90 tablet; Refill: 3  3. Nonrheumatic aortic valve insufficiency Patient has several cardiac diagnoses including LVH valvular disease and grade 1 diastolic dysfunction but I do not believe that she is seeing cardiology currently not having any exertional symptoms, will continue to treat hyperlipidemia and hypertension and monitor labs and symptoms refer to cardiology if and when needed  4. Medication monitoring encounter Labs previously ordered will be completed today  5. Fibromyalgia Treatment for fibromyalgia other chronic pain syndromes she can continue higher dose Cymbalta does seem to be effective and her mood is improved, can try muscle relaxers as needed and also encouraged her  to try Lyrica for pain management so we can improve her function, minimize her pain and avoid any narcotic pain medications in the future - DULoxetine (CYMBALTA) 60 MG capsule; Take 1 capsule (60 mg total) by mouth 2 (two) times daily.  Dispense: 180 capsule; Refill: 3 - tiZANidine (ZANAFLEX) 4 MG tablet; Take 0.5-1.5 tablets (2-6 mg total) by mouth every 8 (eight) hours as needed for muscle spasms.  Dispense: 90 tablet; Refill: 0 - pregabalin (LYRICA) 25 MG capsule; Take 1 capsule (25 mg total) by mouth 2 (two) times daily.  Dispense: 60 capsule; Refill: 0  6. Moderate episode of recurrent major depressive disorder (HCC) PHQ-9 improved and she feels that mood is good and is much better with increase Cymbalta dose - DULoxetine (CYMBALTA) 60 MG capsule; Take 1 capsule (60 mg total) by mouth 2 (two) times daily.  Dispense: 180 capsule; Refill: 3  7. Anxiety disorder, unspecified type Symptoms improved she would like to keep the same higher dosing, no side effects or concerns refills sent in - DULoxetine (CYMBALTA) 60 MG capsule; Take 1 capsule (60 mg total) by mouth 2 (two) times daily.  Dispense: 180  capsule; Refill: 3  8. Foraminal stenosis of lumbar region She was previously referred to Ortho and does also see neuro we will try muscle relaxers for when she has spasms and shooting tightness and pain can use to help her sleep at night, encouraged her to try as needed dosing for tizanidine and try daily dosing for Lyrica - tiZANidine (ZANAFLEX) 4 MG tablet; Take 0.5-1.5 tablets (2-6 mg total) by mouth every 8 (eight) hours as needed for muscle spasms.  Dispense: 90 tablet; Refill: 0 - pregabalin (LYRICA) 25 MG capsule; Take 1 capsule (25 mg total) by mouth 2 (two) times daily.  Dispense: 60 capsule; Refill: 0  9. Hip pain See pain management notes above patient was previously referred to orthopedics - pregabalin (LYRICA) 25 MG capsule; Take 1 capsule (25 mg total) by mouth 2 (two) times daily.  Dispense: 60 capsule; Refill: 0  10. Chronic low back pain, unspecified back pain laterality, unspecified whether sciatica present See above -refer to Ortho, we will continue to try and manage pain - tiZANidine (ZANAFLEX) 4 MG tablet; Take 0.5-1.5 tablets (2-6 mg total) by mouth every 8 (eight) hours as needed for muscle spasms.  Dispense: 90 tablet; Refill: 0 - pregabalin (LYRICA) 25 MG capsule; Take 1 capsule (25 mg total) by mouth 2 (two) times daily.  Dispense: 60 capsule; Refill: 0    Return for 1 month pain management/lyrica recheck telephone or virtual, 4 month CPE/[facility].   Danelle Berry, PA-C 11/24/19 2:23 PM

## 2019-11-24 NOTE — Patient Instructions (Signed)
Diet for Irritable Bowel Syndrome  When you have irritable bowel syndrome (IBS), it is very important to eat the foods and follow the eating habits that are best for your condition. IBS may cause various symptoms such as pain in the abdomen, constipation, or diarrhea. Choosing the right foods can help to ease the discomfort from these symptoms. Work with your health care provider and diet and nutrition specialist (dietitian) to find the eating plan that will help to control your symptoms. What are tips for following this plan?      Keep a food diary. This will help you identify foods that cause symptoms. Write down: ? What you eat and when you eat it. ? What symptoms you have. ? When symptoms occur in relation to your meals, such as "pain in abdomen 2 hours after dinner."  Eat your meals slowly and in a relaxed setting.  Aim to eat 5-6 small meals per day. Do not skip meals.  Drink enough fluid to keep your urine pale yellow.  Ask your health care provider if you should take an over-the-counter probiotic to help restore healthy bacteria in your gut (digestive tract). ? Probiotics are foods that contain good bacteria and yeasts.  Your dietitian may have specific dietary recommendations for you based on your symptoms. He or she may recommend that you: ? Avoid foods that cause symptoms. Talk with your dietitian about other ways to get the same nutrients that are in those problem foods. ? Avoid foods with gluten. Gluten is a protein that is found in rye, wheat, and barley. ? Eat more foods that contain soluble fiber. Examples of foods with high soluble fiber include oats, seeds, and certain fruits and vegetables. Take a fiber supplement if directed by your dietitian. ? Reduce or avoid certain foods called FODMAPs. These are foods that contain carbohydrates that are hard to digest. Ask your doctor which foods contain these carbohydrates. What foods are not recommended? The following are some  foods and drinks that may make your symptoms worse:  Fatty foods, such as french fries.  Foods that contain gluten, such as pasta and cereal.  Dairy products, such as milk, cheese, and ice cream.  Chocolate.  Alcohol.  Products with caffeine, such as coffee.  Carbonated drinks, such as soda.  Foods that are high in FODMAPs. These include certain fruits and vegetables.  Products with sweeteners such as honey, high fructose corn syrup, sorbitol, and mannitol. The items listed above may not be a complete list of foods and beverages you should avoid. Contact a dietitian for more information. What foods are good sources of fiber? Your health care provider or dietitian may recommend that you eat more foods that contain fiber. Fiber can help to reduce constipation and other IBS symptoms. Add foods with fiber to your diet a little at a time so your body can get used to them. Too much fiber at one time might cause gas and swelling of your abdomen. The following are some foods that are good sources of fiber:  Berries, such as raspberries, strawberries, and blueberries.  Tomatoes.  Carrots.  Brown rice.  Oats.  Seeds, such as chia and pumpkin seeds. The items listed above may not be a complete list of recommended sources of fiber. Contact your dietitian for more options. Where to find more information  International Foundation for Functional Gastrointestinal Disorders: www.iffgd.org  National Institute of Diabetes and Digestive and Kidney Diseases: www.niddk.nih.gov Summary  When you have irritable bowel syndrome (IBS), it   is very important to eat the foods and follow the eating habits that are best for your condition.  IBS may cause various symptoms such as pain in the abdomen, constipation, or diarrhea.  Choosing the right foods can help to ease the discomfort that comes from symptoms.  Keep a food diary. This will help you identify foods that cause symptoms.  Your health  care provider or diet and nutrition specialist (dietitian) may recommend that you eat more foods that contain fiber. This information is not intended to replace advice given to you by your health care provider. Make sure you discuss any questions you have with your health care provider. Document Revised: 11/27/2018 Document Reviewed: 04/10/2017 Elsevier Patient Education  2020 [employer]  

## 2019-11-25 ENCOUNTER — Ambulatory Visit: Admission: RE | Admit: 2019-11-25 | Payer: Medicare PPO | Source: Ambulatory Visit

## 2019-11-25 LAB — CBC WITH DIFFERENTIAL/PLATELET
Absolute Monocytes: 608 cells/uL (ref 200–950)
Basophils Absolute: 32 cells/uL (ref 0–200)
Basophils Relative: 0.4 %
Eosinophils Absolute: 176 cells/uL (ref 15–500)
Eosinophils Relative: 2.2 %
HCT: 39.6 % (ref 35.0–45.0)
Hemoglobin: 13.3 g/dL (ref 11.7–15.5)
Lymphs Abs: 1984 cells/uL (ref 850–3900)
MCH: 30.7 pg (ref 27.0–33.0)
MCHC: 33.6 g/dL (ref 32.0–36.0)
MCV: 91.5 fL (ref 80.0–100.0)
MPV: 10.4 fL (ref 7.5–12.5)
Monocytes Relative: 7.6 %
Neutro Abs: 5200 cells/uL (ref 1500–7800)
Neutrophils Relative %: 65 %
Platelets: 300 10*3/uL (ref 140–400)
RBC: 4.33 10*6/uL (ref 3.80–5.10)
RDW: 12.6 % (ref 11.0–15.0)
Total Lymphocyte: 24.8 %
WBC: 8 10*3/uL (ref 3.8–10.8)

## 2019-11-25 LAB — HEMOGLOBIN A1C
Hgb A1c MFr Bld: 5.8 % of total Hgb — ABNORMAL HIGH (ref <5.7)
Mean Plasma Glucose: 120 (calc)
eAG (mmol/L): 6.6 (calc)

## 2019-11-25 LAB — COMPLETE METABOLIC PANEL WITH GFR
AG Ratio: 1.7 (calc) (ref 1.0–2.5)
ALT: 17 U/L (ref 6–29)
AST: 16 U/L (ref 10–35)
Albumin: 4.2 g/dL (ref 3.6–5.1)
Alkaline phosphatase (APISO): 71 U/L (ref 37–153)
BUN/Creatinine Ratio: 19 (calc) (ref 6–22)
BUN: 20 mg/dL (ref 7–25)
CO2: 21 mmol/L (ref 20–32)
Calcium: 8.9 mg/dL (ref 8.6–10.4)
Chloride: 108 mmol/L (ref 98–110)
Creat: 1.03 mg/dL — ABNORMAL HIGH (ref 0.50–0.99)
GFR, Est African American: 67 mL/min/{1.73_m2} (ref >=60)
GFR, Est Non African American: 58 mL/min/{1.73_m2} — ABNORMAL LOW (ref >=60)
Globulin: 2.5 g/dL (calc) (ref 1.9–3.7)
Glucose, Bld: 119 mg/dL — ABNORMAL HIGH (ref 65–99)
Potassium: 4.4 mmol/L (ref 3.5–5.3)
Sodium: 141 mmol/L (ref 135–146)
Total Bilirubin: 0.4 mg/dL (ref 0.2–1.2)
Total Protein: 6.7 g/dL (ref 6.1–8.1)

## 2019-11-25 LAB — VITAMIN D 25 HYDROXY (VIT D DEFICIENCY, FRACTURES): Vit D, 25-Hydroxy: 14 ng/mL — ABNORMAL LOW (ref 30–100)

## 2019-11-25 LAB — LIPID PANEL
Cholesterol: 174 mg/dL (ref <200)
HDL: 52 mg/dL (ref >=50)
LDL Cholesterol (Calc): 101 mg/dL (calc) — ABNORMAL HIGH
Non-HDL Cholesterol (Calc): 122 mg/dL (calc) (ref <130)
Total CHOL/HDL Ratio: 3.3 (calc) (ref <5.0)
Triglycerides: 117 mg/dL (ref <150)

## 2019-11-26 ENCOUNTER — Other Ambulatory Visit: Payer: Self-pay | Admitting: Family Medicine

## 2019-11-26 ENCOUNTER — Ambulatory Visit
Admission: RE | Admit: 2019-11-26 | Discharge: 2019-11-26 | Disposition: A | Discharge status: Home / Self Care | Payer: Medicare PPO | Source: Ambulatory Visit | Attending: Orthopedic Surgery | Admitting: Orthopedic Surgery

## 2019-11-26 ENCOUNTER — Other Ambulatory Visit: Payer: Self-pay

## 2019-11-26 DIAGNOSIS — M4807 Spinal stenosis, lumbosacral region: Secondary | ICD-10-CM | POA: Insufficient documentation

## 2019-11-26 MED ORDER — VITAMIN D (ERGOCALCIFEROL) 1.25 MG (50000 UNIT) PO CAPS: 50000.0000 [IU] | ORAL_CAPSULE | ORAL | 0 refills | Status: DC

## 2019-12-04 ENCOUNTER — Other Ambulatory Visit: Payer: Self-pay | Admitting: Family Medicine

## 2019-12-04 DIAGNOSIS — N6011 Diffuse cystic mastopathy of right breast: Secondary | ICD-10-CM

## 2019-12-16 ENCOUNTER — Other Ambulatory Visit: Payer: Self-pay | Admitting: Family Medicine

## 2019-12-16 DIAGNOSIS — E782 Mixed hyperlipidemia: Secondary | ICD-10-CM

## 2019-12-16 NOTE — Telephone Encounter (Signed)
Mailbox full

## 2019-12-18 ENCOUNTER — Encounter: Payer: Medicare PPO | Admitting: Family Medicine

## 2019-12-18 NOTE — Progress Notes (Deleted)
Patient: Jenna Meyers, Female    DOB: 10-30-1955, 64 y.o.   MRN: [id number] Delsa Grana, PA-C Visit Date: 12/18/2019  Today's Provider: Delsa Grana, PA-C   No chief complaint on file.  Subjective:   Annual physical exam:  Jenna Meyers is a 64 y.o. female who presents today for complete physical exam:  Exercise/Activity:  Diet/nutrition:  Sleep:   Pt wished to discuss acute complaints *** do routine f/up on chronic conditions today in addition to CPE. Advised pt of separate visit billing/coding  USPSTF grade A and B recommendations - reviewed and addressed today  Depression:  Phq 9 completed today by patient, was reviewed by me with patient in the room PHQ score is ***, pt feels *** PHQ 2/9 Scores 11/24/2019 08/01/2019 05/08/2019 01/23/2019  PHQ - 2 Score 1 2 2  0  PHQ- 9 Score 1 4 6  0   Depression screen [facility] 2/9 11/24/2019 08/01/2019 05/08/2019 01/23/2019 01/09/2019  Decreased Interest 0 1 1 0 0  Down, Depressed, Hopeless 1 1 1  0 1  PHQ - 2 Score 1 2 2  0 1  Altered sleeping 0 1 3 0 -  Tired, decreased energy 0 1 0 0 -  Change in appetite 0 0 1 0 -  Feeling bad or failure about yourself  0 0 0 0 -  Trouble concentrating 0 0 0 0 -  Moving slowly or fidgety/restless 0 0 0 0 -  Suicidal thoughts 0 0 0 0 -  PHQ-9 Score 1 4 6  0 -  Difficult doing work/chores Not difficult at all Somewhat difficult - Not difficult at all -    Alcohol screening:   Office Visit from 11/24/2019 in [facility]  AUDIT-C Score  1      Immunizations and Health Maintenance: Health Maintenance  Topic Date Due  . HIV Screening  Never done  . COVID-19 Vaccine (1) Never done  . Fecal DNA (Cologuard)  Never done  . MAMMOGRAM  03/15/2019  . INFLUENZA VACCINE  03/21/2020  . PAP SMEAR-Modifier  04/29/2021  . TETANUS/TDAP  09/02/2025  . Hepatitis C Screening  Completed     Hep C Screening: ***  STD testing and prevention (HIV/chl/gon/syphilis):  see above, no additional  testing desired by pt today  Intimate partner violence:***  Sexual History/Pain during Intercourse: Divorced  Menstrual History/LMP/Abnormal Bleeding: *** No LMP recorded. Patient has had a hysterectomy.  Incontinence Symptoms: ***  Breast cancer:  Last Mammogram: *see HM list above BRCA gene screening: ***  Cervical cancer screening: *** Pt *** family hx of cancers - breast, ovarian, uterine, colon:     Osteoporosis:   Discussion on osteoporosis per age, including high calcium and vitamin D supplementation, weight bearing exercises Pt is *** supplementing with daily calcium/Vit D. *** Bone scan/dexa Roughly experienced menopause at age ***  Skin cancer:  Hx of skin CA -  NO*** Discussed atypical lesions   Colorectal cancer:   Colonoscopy is ***   Discussed concerning signs and sx of CRC, pt denies ***  Lung cancer:   Low Dose CT Chest recommended if Age 68-80 years, 30 pack-year currently smoking OR have quit w/in 15years. Patient {DOES NOT does:27190::"does not"} qualify.    Social History   Tobacco Use  . Smoking status: Former Smoker    Packs/day: 0.15    Years: 45.00    Pack years: 6.75    Types: Cigarettes    Quit date: 01/20/2019    Years since quitting: 0.9  .  Smokeless tobacco: Never Used  . Tobacco comment: been given a Rx for Chantix  Substance Use Topics  . Alcohol use: Yes    Comment: rare  . Drug use: No       Office Visit from 11/24/2019 in [facility]  AUDIT-C Score  1      Family History  Problem Relation Age of Onset  . Cancer Mother        colon  . Osteoporosis Mother   . Hyperlipidemia Mother   . Alzheimer's disease Father   . Thyroid disease Son   . Heart disease Brother      Blood pressure/Hypertension: BP Readings from Last 3 Encounters:  11/24/19 108/74  02/13/19 130/90  01/06/19 (!) 134/92    Weight/Obesity: Wt Readings from Last 3 Encounters:  11/24/19 204 lb 6.4 oz (92.7 kg)  08/01/19 200 lb (90.7  kg)  05/08/19 200 lb (90.7 kg)   BMI Readings from Last 3 Encounters:  11/24/19 35.09 kg/m  08/01/19 34.33 kg/m  05/08/19 34.33 kg/m     Lipids:  Lab Results  Component Value Date   CHOL 174 11/24/2019   CHOL 188 01/10/2019   CHOL 287 (H) 04/29/2018   Lab Results  Component Value Date   HDL 52 11/24/2019   HDL 44 (L) 01/10/2019   HDL 41 (L) 04/29/2018   Lab Results  Component Value Date   LDLCALC 101 (H) 11/24/2019   LDLCALC 114 (H) 01/10/2019   LDLCALC 195 (H) 04/29/2018   Lab Results  Component Value Date   TRIG 117 11/24/2019   TRIG 181 (H) 01/10/2019   TRIG 287 (H) 04/29/2018   Lab Results  Component Value Date   CHOLHDL 3.3 11/24/2019   CHOLHDL 4.3 01/10/2019   CHOLHDL 7.0 (H) 04/29/2018   No results found for: LDLDIRECT Based on the results of lipid panel his/her cardiovascular risk factor ( using [place] )  in the next 10 years is: The 10-year ASCVD risk score Mikey Bussing DC Brooke Bonito., et al., 2013) is: 3.7%   Values used to calculate the score:     Age: 9 years     Sex: Female     Is Non-Hispanic African American: No     Diabetic: No     Tobacco smoker: No     Systolic Blood Pressure: 630 mmHg     Is BP treated: Yes     HDL Cholesterol: 52 mg/dL     Total Cholesterol: 174 mg/dL Glucose:  Glucose  Date Value Ref Range Status  04/17/2014 114 (H) 65 - 99 mg/dL Final  10/24/2013 87 65 - 99 mg/dL Final  10/20/2013 86 65 - 99 mg/dL Final   Glucose, Bld  Date Value Ref Range Status  11/24/2019 119 (H) 65 - 99 mg/dL Final    Comment:    .            Fasting reference interval . For someone without known diabetes, a glucose value between 100 and 125 mg/dL is consistent with prediabetes and should be confirmed with a follow-up test. .   02/13/2019 132 (H) 70 - 99 mg/dL Final  01/10/2019 92 65 - 99 mg/dL Final    Comment:    .            Fasting reference interval .    Hypertension: BP Readings from Last 3 Encounters:  11/24/19 108/74    02/13/19 130/90  01/06/19 (!) 134/92   Obesity: Wt Readings from Last 3 Encounters:  11/24/19 204 lb 6.4 oz (92.7 kg)  08/01/19 200 lb (90.7 kg)  05/08/19 200 lb (90.7 kg)   BMI Readings from Last 3 Encounters:  11/24/19 35.09 kg/m  08/01/19 34.33 kg/m  05/08/19 34.33 kg/m      Advanced Care Planning:  A voluntary discussion about advance care planning including the explanation and discussion of advance directives.   Discussed health care proxy and Living will, and the patient was able to identify a health care proxy as ***.   Patient {DOES_DOES EHU:31497} have a living will at present time.   Social History      She        Social History   Socioeconomic History  . Marital status: Divorced    Spouse name: Not on file  . Number of children: 1  . Years of education: Not on file  . Highest education level: Associate degree: academic program  Occupational History  . Occupation: Disabled  Tobacco Use  . Smoking status: Former Smoker    Packs/day: 0.15    Years: 45.00    Pack years: 6.75    Types: Cigarettes    Quit date: 01/20/2019    Years since quitting: 0.9  . Smokeless tobacco: Never Used  . Tobacco comment: been given a Rx for Chantix  Substance and Sexual Activity  . Alcohol use: Yes    Comment: rare  . Drug use: No  . Sexual activity: Not Currently    Partners: Male  Other Topics Concern  . Not on file  Social History Narrative  . Not on file   Social Determinants of Health   Financial Resource Strain:   . Difficulty of Paying Living Expenses:   Food Insecurity:   . Worried About Charity fundraiser in the Last Year:   . Arboriculturist in the Last Year:   Transportation Needs:   . Film/video editor (Medical):   Marland Kitchen Lack of Transportation (Non-Medical):   Physical Activity:   . Days of Exercise per Week:   . Minutes of Exercise per Session:   Stress:   . Feeling of Stress :   Social Connections: Unknown  . Frequency of Communication with  Friends and Family: More than three times a week  . Frequency of Social Gatherings with Friends and Family: Once a week  . Attends Religious Services: Never  . Active Member of Clubs or Organizations: No  . Attends Archivist Meetings: Never  . Marital Status: Not on file    Family History        Family History  Problem Relation Age of Onset  . Cancer Mother        colon  . Osteoporosis Mother   . Hyperlipidemia Mother   . Alzheimer's disease Father   . Thyroid disease Son   . Heart disease Brother     Patient Active Problem List   Diagnosis Date Noted  . Hyperlipidemia 08/12/2019  . Vitamin D deficiency disease 08/12/2019  . Hallucinations, visual 03/09/2019  . Morbid obesity ([place]) 04/14/2018  . Moderate episode of recurrent major depressive disorder ([place]) 04/14/2018  . Aortic insufficiency 03/19/2018  . LVH (left ventricular hypertrophy) 03/19/2018  . Grade I diastolic dysfunction 02/63/7858  . Mitral regurgitation 03/19/2018  . Lumbar canal stenosis 09/14/2017  . Emphysema of lung ([place]) 05/31/2017  . Palpitations 05/30/2017  . Medication monitoring encounter 02/09/2016  . Essential hypertension, benign 05/17/2015  . Acute allergic reaction 05/17/2015  . Situational stress 05/17/2015  .  Obesity 03/03/2015  . Abnormal thyroid blood test 03/03/2015  . Tobacco abuse 03/03/2015  . Lumbago   . Fibromyalgia   . Anxiety 10/09/2012    Past Surgical History:  Procedure Laterality Date  . ABDOMINAL HYSTERECTOMY  2000   partial, still has cervix  . CESAREAN SECTION  1996  . LAPAROSCOPIC ABDOMINAL EXPLORATION  1999  . TONSILLECTOMY AND ADENOIDECTOMY  1962     Current Outpatient Medications:  .  albuterol (PROVENTIL HFA;VENTOLIN HFA) 108 (90 Base) MCG/ACT inhaler, INHALE 2 PUFFS BY MOUTH  EVERY 4 HOURS AS NEEDED FOR WHEEZING OR SHORTNESS OF  BREATH, Disp: 3 Inhaler, Rfl: 0 .  aspirin EC 81 MG tablet, Take 1 tablet (81 mg total) by mouth daily. Take at least  one hour before naproxen, Disp: , Rfl:  .  atorvastatin (LIPITOR) 80 MG tablet, Take 1 tablet (80 mg total) by mouth daily at 6 PM., Disp: 90 tablet, Rfl: 3 .  Cholecalciferol (VITAMIN D3) 125 MCG (5000 UT) CAPS, Take by mouth. Pt takes once a week, Disp: , Rfl:  .  DULoxetine (CYMBALTA) 60 MG capsule, Take 1 capsule (60 mg total) by mouth 2 (two) times daily., Disp: 180 capsule, Rfl: 3 .  metoprolol succinate (TOPROL-XL) 25 MG 24 hr tablet, Take 1 tablet (25 mg total) by mouth daily. for blood pressure, Disp: 90 tablet, Rfl: 3 .  pregabalin (LYRICA) 25 MG capsule, Take 1 capsule (25 mg total) by mouth 2 (two) times daily., Disp: 60 capsule, Rfl: 0 .  tiZANidine (ZANAFLEX) 4 MG tablet, Take 0.5-1.5 tablets (2-6 mg total) by mouth every 8 (eight) hours as needed for muscle spasms., Disp: 90 tablet, Rfl: 0 .  Vitamin D, Ergocalciferol, (DRISDOL) 1.25 MG (50000 UNIT) CAPS capsule, Take 1 capsule (50,000 Units total) by mouth every 7 (seven) days. x12 weeks., Disp: 12 capsule, Rfl: 0  No Known Allergies  Patient Care Team: Delsa Grana, PA-C as PCP - General (Family Medicine) Dionisio David, MD as Consulting Physician (Cardiology)  Review of Systems   ***       Objective:   Vitals:  There were no vitals filed for this visit.  There is no height or weight on file to calculate BMI.  Physical Exam    Fall Risk: Fall Risk  11/24/2019 08/01/2019 05/08/2019 01/23/2019 01/09/2019  Falls in the past year? 0 0 0 0 0  Number falls in past yr: 0 0 0 - 0  Injury with Fall? 0 0 0 - 0  Risk for fall due to : - - - - -  Risk for fall due to: Comment - - - - -  Follow up - - Falls prevention discussed - -    Functional Status Survey:     Assessment & Plan:    CPE completed today  . USPSTF grade A and B recommendations reviewed with patient; age-appropriate recommendations, preventive care, screening tests, etc discussed and encouraged; healthy living encouraged; see AVS for patient education  given to patient  . Discussed importance of 150 minutes of physical activity weekly, AHA exercise recommendations given to pt in AVS/handout  . Discussed importance of healthy diet:  eating lean meats and proteins, avoiding trans fats and saturated fats, avoid simple sugars and excessive carbs in diet, eat 6 servings of fruit/vegetables daily and drink plenty of water and avoid sweet beverages.    . Recommended pt to do annual eye exam and routine dental exams/cleanings  . Depression, alcohol, fall screening completed as documented  above and per flowsheets  . Reviewed Health Maintenance: Health Maintenance  Topic Date Due  . HIV Screening  Never done  . COVID-19 Vaccine (1) Never done  . Fecal DNA (Cologuard)  Never done  . MAMMOGRAM  03/15/2019  . INFLUENZA VACCINE  03/21/2020  . PAP SMEAR-Modifier  04/29/2021  . TETANUS/TDAP  09/02/2025  . Hepatitis C Screening  Completed    . Immunizations: Immunization History  Administered Date(s) Administered  . Influenza Inj Mdck Quad Pf 06/14/2017  . Influenza,inj,Quad PF,6+ Mos 04/04/2018  . Tdap 09/03/2015  . Zoster 08/21/2013  . Zoster Recombinat (Shingrix) 04/04/2018    ***  No orders of the defined types were placed in this encounter.       Delsa Grana, PA-C 12/18/19 1:45 PM  [facility]

## 2020-01-01 ENCOUNTER — Other Ambulatory Visit: Payer: Self-pay | Admitting: Family Medicine

## 2020-01-01 ENCOUNTER — Ambulatory Visit: Payer: Medicare PPO | Admitting: Family Medicine

## 2020-01-01 ENCOUNTER — Telehealth: Payer: Self-pay | Admitting: Family Medicine

## 2020-01-01 DIAGNOSIS — F331 Major depressive disorder, recurrent, moderate: Secondary | ICD-10-CM

## 2020-01-01 DIAGNOSIS — M797 Fibromyalgia: Secondary | ICD-10-CM

## 2020-01-01 DIAGNOSIS — F419 Anxiety disorder, unspecified: Secondary | ICD-10-CM

## 2020-01-01 DIAGNOSIS — E782 Mixed hyperlipidemia: Secondary | ICD-10-CM

## 2020-01-01 NOTE — Telephone Encounter (Signed)
Medication Refill - Medication: DULoxetine (CYMBALTA) 60 MG capsule  Pt is experiencing a family emergency. Is scheduled for 01/20/2020. Requesting 1 month supply to last her until appt.   Has the patient contacted their pharmacy? Yes.   (Agent: If no, request that the patient contact the pharmacy for the refill.) (Agent: If yes, when and what did the pharmacy advise?)  Preferred Pharmacy (with phone number or street name):  [facility] [address] [place] - [address]  [address] [place] [address]  Phone: [phone] Fax: [phone]     Agent: Please be advised that RX refills may take up to 3 business days. We ask that you follow-up with your pharmacy.

## 2020-01-01 NOTE — Telephone Encounter (Signed)
Requested medication (s) are due for refill today - no  Requested medication (s) are on the active medication list -yes  Future visit scheduled -yes  Last refill: 11/24/19  Notes to clinic: Attempted to call patient to verify how using/taking medication- left message to call back- patient requesting 1 month supply sent to local pharmacy- sent for review   Requested Prescriptions  Pending Prescriptions Disp Refills   DULoxetine (CYMBALTA) 60 MG capsule 180 capsule 3    Sig: Take 1 capsule (60 mg total) by mouth 2 (two) times daily.      Psychiatry: Antidepressants - SNRI Passed - 01/01/2020  3:40 PM      Passed - Completed PHQ-2 or PHQ-9 in the last 360 days.      Passed - Last BP in normal range    BP Readings from Last 1 Encounters:  11/24/19 108/74          Passed - Valid encounter within last 6 months    Recent Outpatient Visits           1 month ago Mixed hyperlipidemia   [facility] Danelle Berry, PA-C   5 months ago Fibromyalgia   [facility] Danelle Berry, PA-C   11 months ago Essential hypertension, benign   [facility] Cheryle Horsfall, NP   11 months ago Syncope, unspecified syncope type   [facility] Cheryle Horsfall, NP   1 year ago Preventative health care   [facility] Lada, Janit Bern, MD       Future Appointments             In 2 weeks Danelle Berry, PA-C [facility]                Requested Prescriptions  Pending Prescriptions Disp Refills   DULoxetine (CYMBALTA) 60 MG capsule 180 capsule 3    Sig: Take 1 capsule (60 mg total) by mouth 2 (two) times daily.      Psychiatry: Antidepressants - SNRI Passed - 01/01/2020  3:40 PM      Passed - Completed PHQ-2 or PHQ-9 in the last 360 days.      Passed - Last BP in normal range    BP Readings from Last 1 Encounters:  11/24/19 108/74          Passed - Valid  encounter within last 6 months    Recent Outpatient Visits           1 month ago Mixed hyperlipidemia   [facility] Danelle Berry, PA-C   5 months ago Fibromyalgia   [facility] Danelle Berry, PA-C   11 months ago Essential hypertension, benign   [facility] Cheryle Horsfall, NP   11 months ago Syncope, unspecified syncope type   [facility] Cheryle Horsfall, NP   1 year ago Preventative health care   [facility] Lada, Janit Bern, MD       Future Appointments             In 2 weeks Danelle Berry, PA-C [facility], [facility]

## 2020-01-02 NOTE — Telephone Encounter (Signed)
Pt called again stating that she is leaving for out of town for a week and is requesting to know why the medication was denied. Please advise.

## 2020-01-05 NOTE — Telephone Encounter (Signed)
Refills was sent on 11/24/19 with 3 refills

## 2020-01-05 NOTE — Telephone Encounter (Signed)
Refills previously sent: torvastatin (LIPITOR) 80 MG tablet 90 tablet 3 11/24/2019    Sig - Route: Take 1 tablet (80 mg total) by mouth daily at 6 PM. - Oral   Sent to pharmacy as: atorvastatin (LIPITOR) 80 MG tablet   E-Prescribing Status: Receipt confirmed by pharmacy (11/24/2019 2:27 PM EDT)

## 2020-01-05 NOTE — Telephone Encounter (Signed)
Called left message for patient to call back with any issues regarding medication refills

## 2020-01-14 ENCOUNTER — Ambulatory Visit: Payer: Self-pay | Admitting: *Deleted

## 2020-01-14 NOTE — Telephone Encounter (Signed)
Left patient message to explain a #90 day supply was sent to [facility] in April. [facility] to please call the office

## 2020-01-14 NOTE — Telephone Encounter (Signed)
B/P today up to 160/90. Short lasting headache early this morning and feeling woozy headed that has resolved. No changes in her diet at this time. Taking antihypertensive as prescribed. Denies CP/SOB/weakness/vision changes. Mentioned she recently stopped taking Cymbalta abruptly. No availability with provider this week. She currently has an appointment on 01/20/20. Reviewed Care Advice and urgent symptoms to seek immediate evaluation should she experience. Patient stated understanding all discussed.  Reason for Disposition . [1] Systolic BP  >= 130 OR Diastolic >= 80 AND [2] not taking BP medications  Answer Assessment - Initial Assessment Questions 1. BLOOD PRESSURE: "What is the blood pressure?" "Did you take at least two measurements 5 minutes apart?"     150/80 and 160/90 20 minutes go. 2. ONSET: "When did you take your blood pressure?"     Within the last hour 3. HOW: "How did you obtain the blood pressure?" (e.g., visiting nurse, automatic home BP monitor)    Home monitor 4. HISTORY: "Do you have a history of high blood pressure?"     yes 5. MEDICATIONS: "Are you taking any medications for blood pressure?" "Have you missed any doses recently?"     Taking as prescribed 6. OTHER SYMPTOMS: "Do you have any symptoms?" (e.g., headache, chest pain, blurred vision, difficulty breathing, weakness)     Dizziness earlier today 7. PREGNANCY: "Is there any chance you are pregnant?" "When was your last menstrual period?"     no  Protocols used: HIGH BLOOD PRESSURE-A-AH

## 2020-01-20 ENCOUNTER — Encounter: Payer: Self-pay | Admitting: Family Medicine

## 2020-01-20 ENCOUNTER — Other Ambulatory Visit: Payer: Self-pay

## 2020-01-20 ENCOUNTER — Ambulatory Visit (INDEPENDENT_AMBULATORY_CARE_PROVIDER_SITE_OTHER): Payer: Medicare PPO | Admitting: Family Medicine

## 2020-01-20 VITALS — BP 128/78 | HR 87 | Temp 97.6°F | Resp 14 | Ht 64.0 in | Wt 195.5 lb

## 2020-01-20 DIAGNOSIS — M545 Low back pain, unspecified: Secondary | ICD-10-CM

## 2020-01-20 DIAGNOSIS — M48061 Spinal stenosis, lumbar region without neurogenic claudication: Secondary | ICD-10-CM

## 2020-01-20 DIAGNOSIS — G8929 Other chronic pain: Secondary | ICD-10-CM

## 2020-01-20 DIAGNOSIS — E66811 Obesity, class 1: Secondary | ICD-10-CM

## 2020-01-20 DIAGNOSIS — E669 Obesity, unspecified: Secondary | ICD-10-CM

## 2020-01-20 DIAGNOSIS — M25559 Pain in unspecified hip: Secondary | ICD-10-CM | POA: Diagnosis not present

## 2020-01-20 DIAGNOSIS — M797 Fibromyalgia: Secondary | ICD-10-CM | POA: Diagnosis not present

## 2020-01-20 DIAGNOSIS — Z6833 Body mass index (BMI) 33.0-33.9, adult: Secondary | ICD-10-CM

## 2020-01-20 MED ORDER — PREGABALIN 25 MG PO CAPS: 25.0000 mg | ORAL_CAPSULE | Freq: Two times a day (BID) | ORAL | 0 refills | Status: DC

## 2020-01-20 NOTE — Patient Instructions (Signed)
For statin and grapefruit juice - Atorvastatin serum concentrations may be increased by grapefruit juice. Management: Avoid concurrent intake of large quantities of grapefruit juice 1.2 quarts/day  Let me know if you need refills through [facility] for the lyrica (controlled substances may need to be requested for each refill)

## 2020-01-20 NOTE — Progress Notes (Signed)
Name: Jenna Meyers   MRN: [id number]    DOB: 1956/05/14   Date:01/20/2020       Progress Note  Chief Complaint  Patient presents with  . Follow-up    1 month  . Medication Refill  . Pain     Subjective:   Jenna Meyers is a 64 y.o. female, presents to clinic for routine follow up on the conditions listed above.   F/up on fibromyalgia, MDD and other MSK pain after increasing cymbalta and trial of lyrica She feels much better with dose increase in cymbalta and did not try lyrica.    Labs previously ordered but not completed for chronic conditions.  She did have some trouble with meds and refills and without meds she did have some flares in the last month. She is back on her cymbalta 60 mg BID lyrica 25 mg BID - she was not able to get from her mail order  She saw ortho and is going to restart PT and she was given a back brace that has helped with her pain.      Patient Active Problem List   Diagnosis Date Noted  . Hyperlipidemia 08/12/2019  . Vitamin D deficiency disease 08/12/2019  . Hallucinations, visual 03/09/2019  . Morbid obesity (HCC) 04/14/2018  . Moderate episode of recurrent major depressive disorder (HCC) 04/14/2018  . Aortic insufficiency 03/19/2018  . LVH (left ventricular hypertrophy) 03/19/2018  . Grade I diastolic dysfunction 03/19/2018  . Mitral regurgitation 03/19/2018  . Lumbar canal stenosis 09/14/2017  . Emphysema of lung (HCC) 05/31/2017  . Palpitations 05/30/2017  . Medication monitoring encounter 02/09/2016  . Essential hypertension, benign 05/17/2015  . Acute allergic reaction 05/17/2015  . Situational stress 05/17/2015  . Obesity 03/03/2015  . Abnormal thyroid blood test 03/03/2015  . Tobacco abuse 03/03/2015  . Lumbago   . Fibromyalgia   . Anxiety 10/09/2012    Past Surgical History:  Procedure Laterality Date  . ABDOMINAL HYSTERECTOMY  2000   partial, still has cervix  . CESAREAN SECTION  1996  . LAPAROSCOPIC ABDOMINAL  EXPLORATION  1999  . TONSILLECTOMY AND ADENOIDECTOMY  1962    Family History  Problem Relation Age of Onset  . Cancer Mother        colon  . Osteoporosis Mother   . Hyperlipidemia Mother   . Alzheimer's disease Father   . Thyroid disease Son   . Heart disease Brother     Social History   Tobacco Use  . Smoking status: Former Smoker    Packs/day: 0.15    Years: 45.00    Pack years: 6.75    Types: Cigarettes    Quit date: 01/20/2019    Years since quitting: 1.0  . Smokeless tobacco: Never Used  . Tobacco comment: been given a Rx for Chantix  Substance Use Topics  . Alcohol use: Yes    Comment: rare  . Drug use: No      Current Outpatient Medications:  .  albuterol (PROVENTIL HFA;VENTOLIN HFA) 108 (90 Base) MCG/ACT inhaler, INHALE 2 PUFFS BY MOUTH  EVERY 4 HOURS AS NEEDED FOR WHEEZING OR SHORTNESS OF  BREATH, Disp: 3 Inhaler, Rfl: 0 .  aspirin EC 81 MG tablet, Take 1 tablet (81 mg total) by mouth daily. Take at least one hour before naproxen, Disp: , Rfl:  .  atorvastatin (LIPITOR) 80 MG tablet, Take 1 tablet (80 mg total) by mouth daily at 6 PM., Disp: 90 tablet, Rfl: 3 .  DULoxetine (CYMBALTA) 60  MG capsule, Take 1 capsule (60 mg total) by mouth 2 (two) times daily., Disp: 180 capsule, Rfl: 3 .  metoprolol succinate (TOPROL-XL) 25 MG 24 hr tablet, Take 1 tablet (25 mg total) by mouth daily. for blood pressure, Disp: 90 tablet, Rfl: 3 .  pregabalin (LYRICA) 25 MG capsule, Take 1 capsule (25 mg total) by mouth 2 (two) times daily., Disp: 60 capsule, Rfl: 0 .  tiZANidine (ZANAFLEX) 4 MG tablet, Take 0.5-1.5 tablets (2-6 mg total) by mouth every 8 (eight) hours as needed for muscle spasms., Disp: 90 tablet, Rfl: 0 .  Vitamin D, Ergocalciferol, (DRISDOL) 1.25 MG (50000 UNIT) CAPS capsule, Take 1 capsule (50,000 Units total) by mouth every 7 (seven) days. x12 weeks., Disp: 12 capsule, Rfl: 0 .  Cholecalciferol (VITAMIN D3) 125 MCG (5000 UT) CAPS, Take by mouth. Pt takes once a week,  Disp: , Rfl:   No Known Allergies  Chart Review Today: I personally reviewed active problem list, medication list, allergies, family history, social history, health maintenance, notes from last encounter, lab results, imaging with the patient/caregiver today.   Review of Systems  10 Systems reviewed and are negative for acute change except as noted in the HPI.    Objective:    Vitals:   01/20/20 1502  BP: 128/78  Pulse: 87  Resp: 14  Temp: 97.6 F (36.4 C)  SpO2: 97%  Weight: 195 lb 8 oz (88.7 kg)  Height: 5\' 4"  (1.626 m)    Body mass index is 33.56 kg/m.  Physical Exam Vitals and nursing note reviewed.  Constitutional:      General: She is not in acute distress.    Appearance: Normal appearance. She is obese. She is not ill-appearing, toxic-appearing or diaphoretic.  HENT:     Right Ear: External ear normal.     Left Ear: External ear normal.  Cardiovascular:     Rate and Rhythm: Normal rate and regular rhythm.  Pulmonary:     Effort: Pulmonary effort is normal.     Breath sounds: Normal breath sounds.  Musculoskeletal:     Right lower leg: No edema.     Left lower leg: No edema.  Skin:    General: Skin is warm and dry.     Coloration: Skin is not jaundiced or pale.  Neurological:     Mental Status: She is alert.  Psychiatric:        Mood and Affect: Mood normal.        Behavior: Behavior normal.       PHQ2/9: Depression screen [facility] 2/9 01/20/2020 11/24/2019 08/01/2019 05/08/2019 01/23/2019  Decreased Interest 0 0 1 1 0  Down, Depressed, Hopeless 0 1 1 1  0  PHQ - 2 Score 0 1 2 2  0  Altered sleeping 3 0 1 3 0  Tired, decreased energy 1 0 1 0 0  Change in appetite 0 0 0 1 0  Feeling bad or failure about yourself  0 0 0 0 0  Trouble concentrating 0 0 0 0 0  Moving slowly or fidgety/restless 0 0 0 0 0  Suicidal thoughts 0 0 0 0 0  PHQ-9 Score 4 1 4 6  0  Difficult doing work/chores Not difficult at all Not difficult at all Somewhat difficult - Not difficult  at all  Some recent data might be hidden    phq 9 is neg, reviewed today  Fall Risk: Fall Risk  01/20/2020 11/24/2019 08/01/2019 05/08/2019 01/23/2019  Falls in the past year? 0  0 0 0 0  Number falls in past yr: 0 0 0 0 -  Injury with Fall? 0 0 0 0 -  Risk for fall due to : - - - - -  Risk for fall due to: Comment - - - - -  Follow up - - - Falls prevention discussed -    Functional Status Survey: Is the patient deaf or have difficulty hearing?: No Does the patient have difficulty seeing, even when wearing glasses/contacts?: No Does the patient have difficulty concentrating, remembering, or making decisions?: No Does the patient have difficulty walking or climbing stairs?: No Does the patient have difficulty dressing or bathing?: No Does the patient have difficulty doing errands alone such as visiting a doctor's office or shopping?: No   Assessment & Plan:   [facility] at [place] - for back/ortho management-he is managing #2 3 and 4  1. Fibromyalgia See below - pregabalin (LYRICA) 25 MG capsule; Take 1 capsule (25 mg total) by mouth 2 (two) times daily.  Dispense: 60 capsule; Refill: 0  2. Foraminal stenosis of lumbar region - pregabalin (LYRICA) 25 MG capsule; Take 1 capsule (25 mg total) by mouth 2 (two) times daily.  Dispense: 60 capsule; Refill: 0  3. Hip pain - pregabalin (LYRICA) 25 MG capsule; Take 1 capsule (25 mg total) by mouth 2 (two) times daily.  Dispense: 60 capsule; Refill: 0  4. Chronic low back pain, unspecified back pain laterality, unspecified whether sciatica present - pregabalin (LYRICA) 25 MG capsule; Take 1 capsule (25 mg total) by mouth 2 (two) times daily.  Dispense: 60 capsule; Refill: 0  5. Encounter for chronic pain management Managing fibomyalgia, chronic back pain  With cymbalta 60 mg BID, lyrica 25 mg BID, she is working with physical therapy and bracing has helped.  She had severe pain when she got behind on medications.  We will be reviewing her meds  and refills and ensuring that she does not run out again.   Patient pain today is rated 2 out of 10  Local refill for first month of Lyrica and then will attempt to send a 68-month supply to her mail order pharmacy - pregabalin (LYRICA) 25 MG capsule; Take 1 capsule (25 mg total) by mouth 2 (two) times daily.  Dispense: 60 capsule; Refill: 0 - pregabalin (LYRICA) 25 MG capsule; Take 1 capsule (25 mg total) by mouth 2 (two) times daily.  Dispense: 180 capsule; Refill: 0   6. Class 1 obesity with serious comorbidity and body mass index (BMI) of 33.0 to 33.9 in adult, unspecified obesity type - Amb ref to Medical Nutrition Therapy-MNT Patient wishes to work on diet nutrition for BMI will refer to medical nutritional therapy  Danelle Berry, PA-C 01/20/20 3:14 PM

## 2020-01-22 ENCOUNTER — Other Ambulatory Visit: Payer: Self-pay | Admitting: Family Medicine

## 2020-01-22 DIAGNOSIS — I10 Essential (primary) hypertension: Secondary | ICD-10-CM

## 2020-01-23 ENCOUNTER — Ambulatory Visit
Admission: RE | Admit: 2020-01-23 | Discharge: 2020-01-23 | Disposition: A | Discharge status: Home / Self Care | Payer: Medicare PPO | Source: Ambulatory Visit | Attending: Family Medicine | Admitting: Family Medicine

## 2020-01-23 DIAGNOSIS — N6011 Diffuse cystic mastopathy of right breast: Secondary | ICD-10-CM

## 2020-01-29 ENCOUNTER — Other Ambulatory Visit: Payer: Self-pay | Admitting: Family Medicine

## 2020-01-29 ENCOUNTER — Encounter: Payer: Self-pay | Admitting: Family Medicine

## 2020-01-29 DIAGNOSIS — E782 Mixed hyperlipidemia: Secondary | ICD-10-CM

## 2020-02-25 ENCOUNTER — Telehealth: Payer: Self-pay | Admitting: Family Medicine

## 2020-02-25 NOTE — Telephone Encounter (Signed)
Pt called stating that she was contacted by nutritionist and since she is not a diabetic they will not cover her. Pt states that they will cover a medicare diabetes program. Pt states that the referral has to be done through medicare. Please advise.

## 2020-04-13 ENCOUNTER — Telehealth: Payer: Self-pay | Admitting: Family Medicine

## 2020-04-13 NOTE — Telephone Encounter (Signed)
varenicline (CHANTIX CONTINUING MONTH PAK) 1 MG tablet [   Patient is requesting a refill.    Pharmacy  [facility] - [place], [place] - [address] Phone:  [phone]  Fax:  [phone]

## 2020-04-19 NOTE — Telephone Encounter (Signed)
Do not see on current medication list or history will need appointment

## 2020-04-27 IMAGING — MG MM DIGITAL SCREENING BILAT W/ TOMO W/ CAD
8 of 11 series · 8 of 27 positions shown · non-contrast
Comparison: None.

CLINICAL DATA: Screening. Reestablishing baseline. Last mammogram
obtained over 10 years ago at an uncertain location.

EXAM:
DIGITAL SCREENING BILATERAL MAMMOGRAM WITH TOMO AND CAD

[L MLO synth-2D]
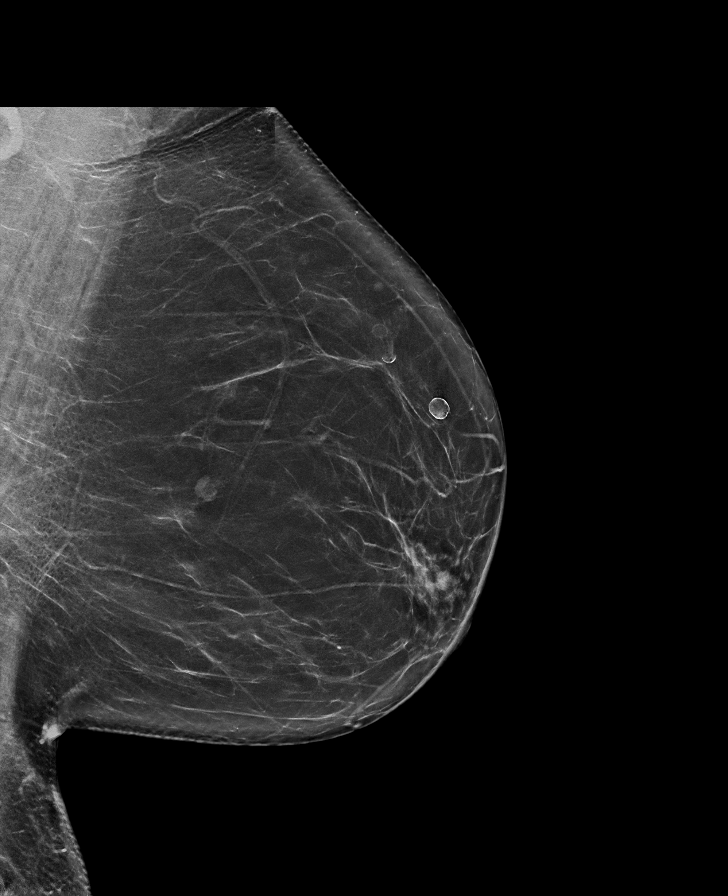

[L CC synth-2D]
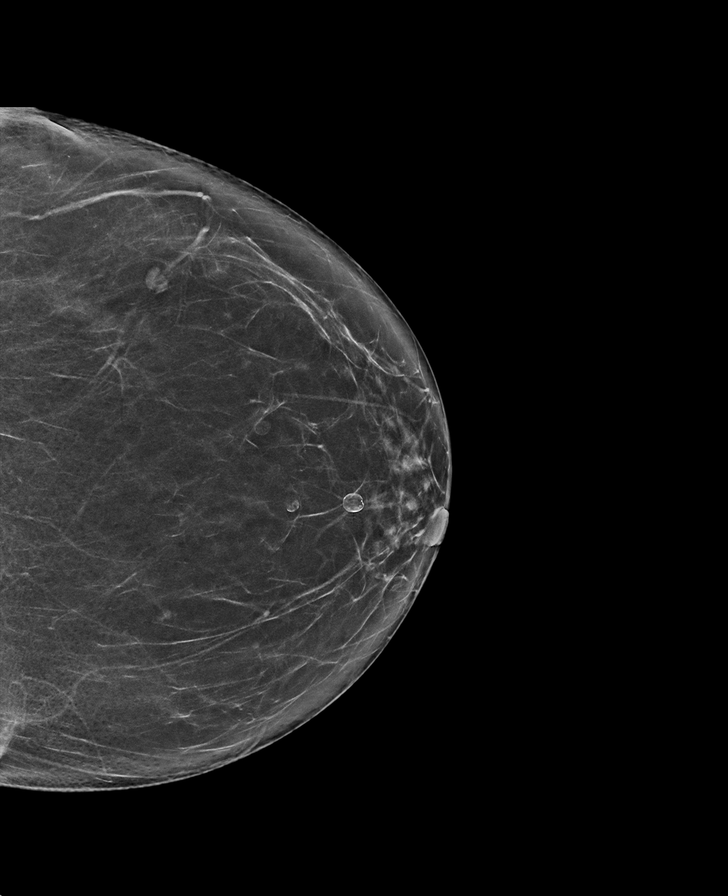

[R MLO synth-2D]
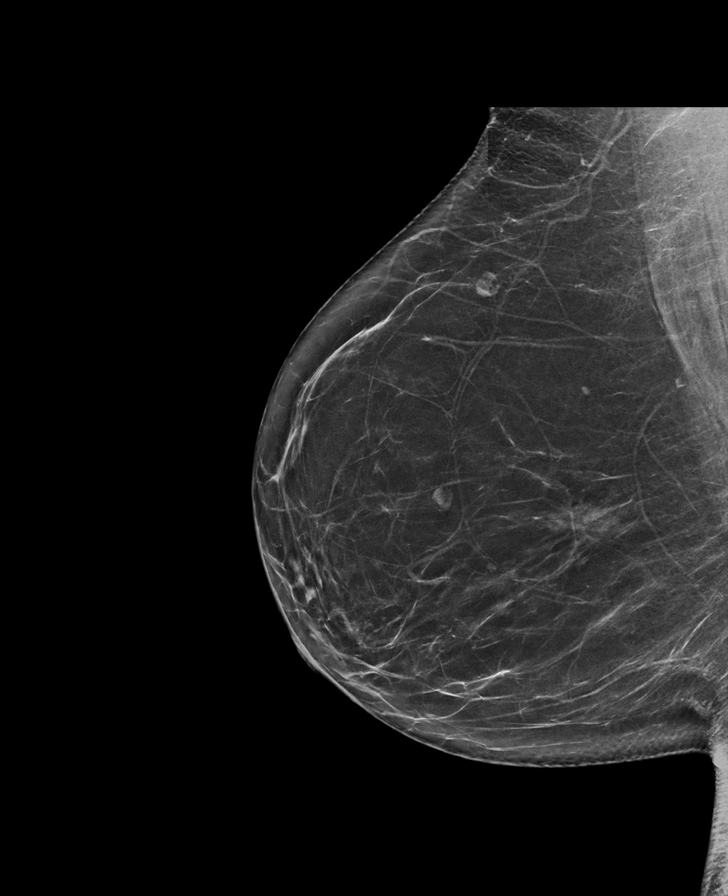

[R CC synth-2D]
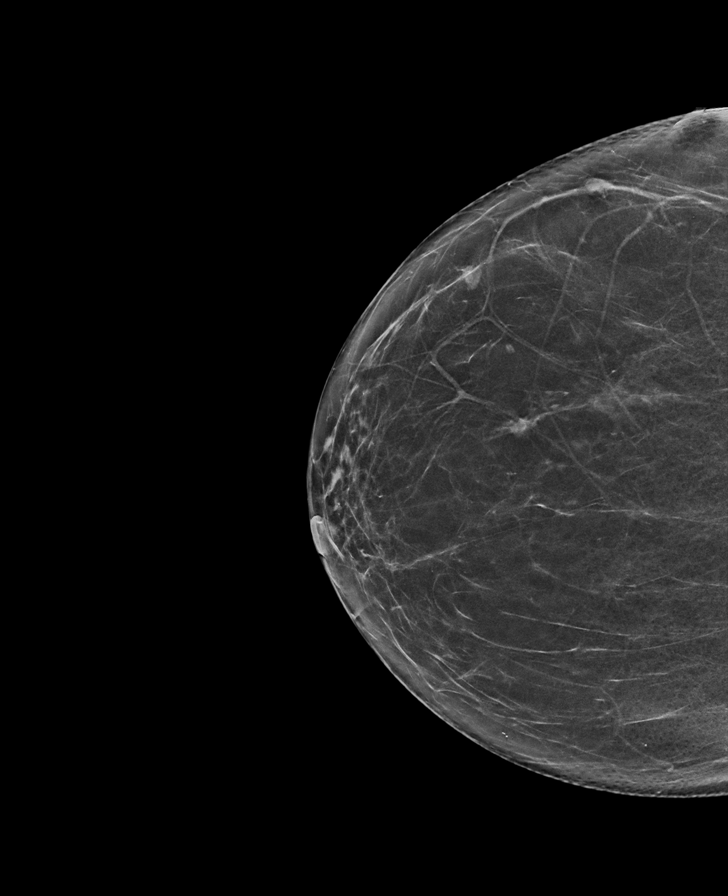

[R CC tomo · tomo slice 34/67.0]
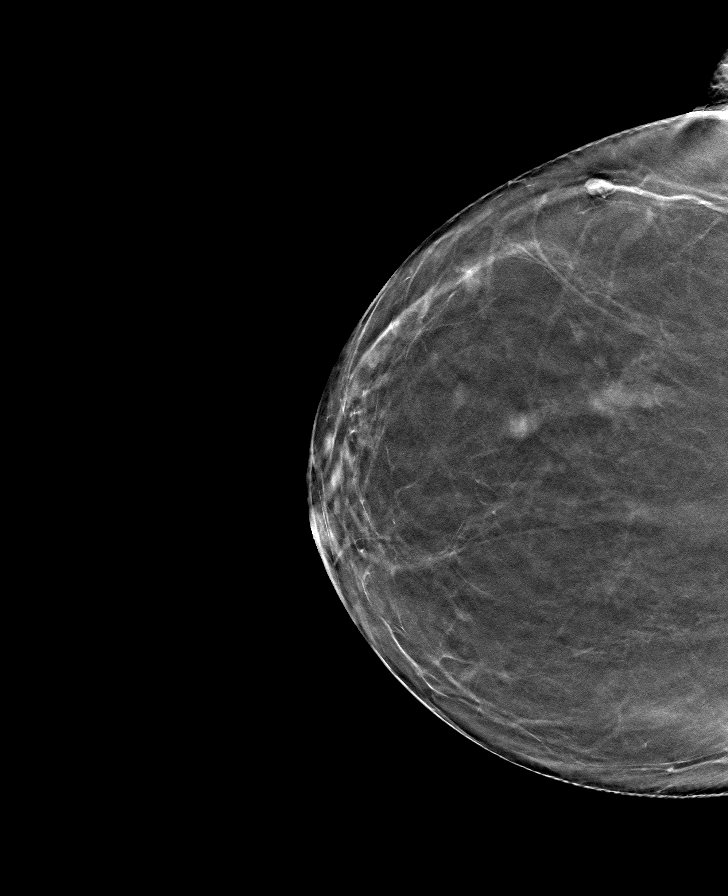

[R CC (1 of 2)]
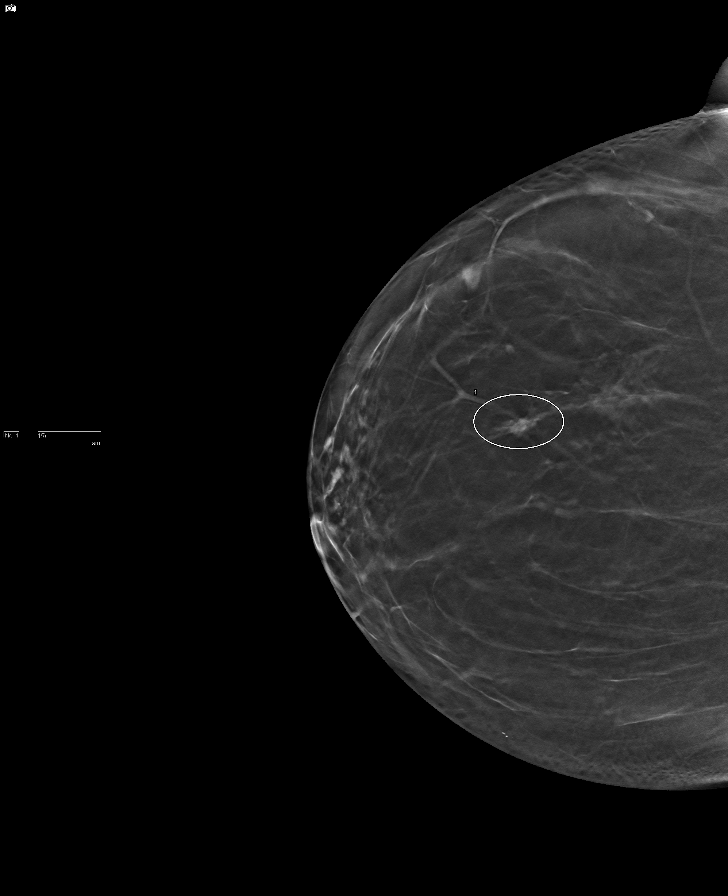

[R CC (2 of 2)]
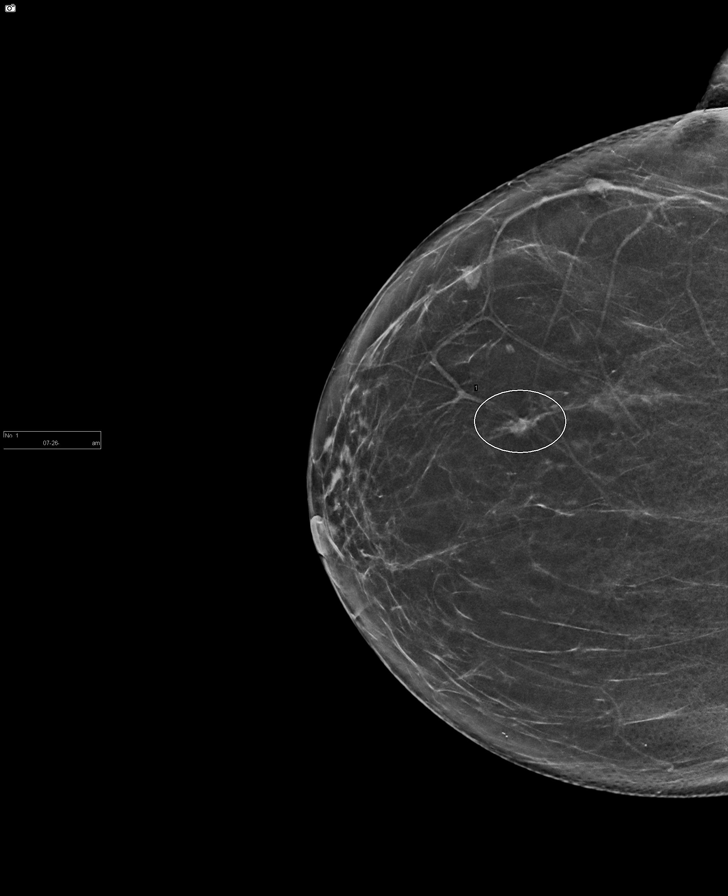

[R MLO]
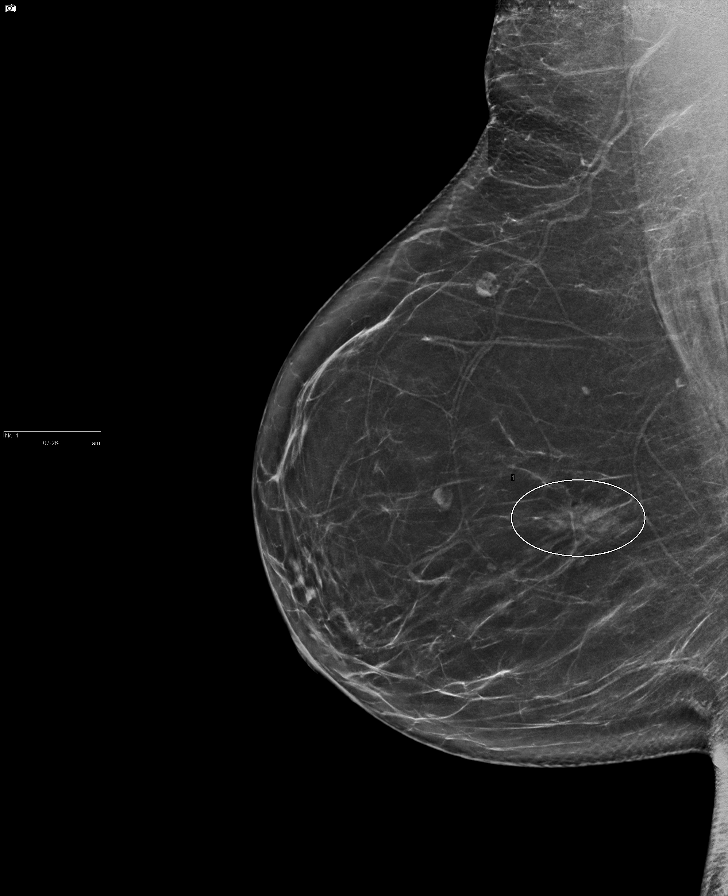

[8 of 27 positions shown; findings below may reference images not displayed]

ACR Breast Density Category b: There are scattered areas of
fibroglandular density.
FINDINGS: In the right breast, a possible asymmetry warrants further
evaluation. This possible asymmetry is seen within the outer RIGHT
breast, at middle depth, tomosynthesis CC slice 15, possible
correlate within the central RIGHT breast on the MLO projection.

In the left breast, no findings suspicious for malignancy. Images
were processed with CAD.
IMPRESSION: Further evaluation is suggested for possible asymmetry in the right
breast.

RECOMMENDATION:
Diagnostic mammogram and possibly ultrasound of the right breast.
(Code:[id number])

The patient will be contacted regarding the findings, and additional
imaging will be scheduled.

BI-RADS CATEGORY  0: Incomplete. Need additional imaging evaluation
and/or prior mammograms for comparison.

## 2020-05-11 ENCOUNTER — Ambulatory Visit (INDEPENDENT_AMBULATORY_CARE_PROVIDER_SITE_OTHER): Payer: Medicare PPO

## 2020-05-11 DIAGNOSIS — Z0000 Encounter for general adult medical examination without abnormal findings: Secondary | ICD-10-CM | POA: Diagnosis not present

## 2020-05-11 NOTE — Progress Notes (Signed)
Name: Jenna Meyers   MRN: [id number]    DOB: 04/10/1956   Date:05/12/2020       Progress Note  Chief Complaint  Patient presents with  . Medication Refill     Subjective:   Jenna Meyers is a 64 y.o. female, presents to clinic for routine f/up   Hyperlipidemia: Currently treated with Lipitpor 80mg  qd - still taking and doing well, still working on diet and exercise and loosing weight Last Lipids: Lab Results  Component Value Date   CHOL 174 11/24/2019   HDL 52 11/24/2019   LDLCALC 101 (H) 11/24/2019   TRIG 117 11/24/2019   CHOLHDL 3.3 11/24/2019   - Denies: Chest pain, shortness of breath, myalgias, claudication  Hypertension:  Currently managed on Metoprolol 25 mg qd Pt reports good med compliance and denies any SE.   Blood pressure today is well controlled. BP Readings from Last 3 Encounters:  05/12/20 128/84  01/20/20 128/78  11/24/19 108/74   Pt denies CP, SOB, exertional sx, LE edema, palpitation, Ha's, visual disturbances, lightheadedness, hypotension, syncope. Dietary efforts for BP? Eating healthy, loosing weight  Smoking cessation instruction/counseling given:  counseled patient on the dangers of tobacco use, advised patient to stop smoking, and reviewed strategies to maximize success Would like to get on chantix again - around her children who smoke and she's had increase stress, she would like to take chantix again, did well with it in the past w/o SE.  Fibromyalgia/FMS - on cymbalta, lyrica and zanaflex Pain worse with weather changes.  zanaflex helps a little  Shingrix shot - did one in 2019 but didn't complete the series  She wants another referral to a diabetes or nutrition and exercise program through medicare - but has prediabetes and obesity - last referral did not go through due to no T2DM diagnosis  Prediabetes: Lab Results  Component Value Date   HGBA1C 5.8 (H) 11/24/2019    Obesity: Wt Readings from Last 5 Encounters:  05/12/20 192  lb (87.1 kg)  01/20/20 195 lb 8 oz (88.7 kg)  11/24/19 204 lb 6.4 oz (92.7 kg)  08/01/19 200 lb (90.7 kg)  05/08/19 200 lb (90.7 kg)   BMI Readings from Last 5 Encounters:  05/12/20 32.96 kg/m  01/20/20 33.56 kg/m  11/24/19 35.09 kg/m  08/01/19 34.33 kg/m  05/08/19 34.33 kg/m   Down 8 lbs - continued diet and exercise efforts -she still wants help with understanding diet/nutrition and wants to continue to loose weight  MDD and anxiety PHQ neg, reviewed today, mood good, still some anxiety  Depression screen [facility] 2/9 05/11/2020 01/20/2020 11/24/2019  Decreased Interest 0 0 0  Down, Depressed, Hopeless 0 0 1  PHQ - 2 Score 0 0 1  Altered sleeping - 3 0  Tired, decreased energy - 1 0  Change in appetite - 0 0  Feeling bad or failure about yourself  - 0 0  Trouble concentrating - 0 0  Moving slowly or fidgety/restless - 0 0  Suicidal thoughts - 0 0  PHQ-9 Score - 4 1  Difficult doing work/chores - Not difficult at all Not difficult at all  Some recent data might be hidden   Colon CA screening - pt wanted cologuard - but she does have family hx of CRC in her mother, no past GI consult or screening colonoscopy - she had a colonoscopy once when she was 17 due to rectal bleeding   Current Outpatient Medications:  .  albuterol (PROVENTIL HFA;VENTOLIN HFA) 108 (  90 Base) MCG/ACT inhaler, INHALE 2 PUFFS BY MOUTH  EVERY 4 HOURS AS NEEDED FOR WHEEZING OR SHORTNESS OF  BREATH, Disp: 3 Inhaler, Rfl: 0 .  aspirin EC 81 MG tablet, Take 1 tablet (81 mg total) by mouth daily. Take at least one hour before naproxen, Disp: , Rfl:  .  atorvastatin (LIPITOR) 80 MG tablet, Take 1 tablet (80 mg total) by mouth at bedtime., Disp: 90 tablet, Rfl: 3 .  Cholecalciferol (VITAMIN D3) 125 MCG (5000 UT) CAPS, Take by mouth. Pt takes once a week , Disp: , Rfl:  .  DULoxetine (CYMBALTA) 60 MG capsule, Take 1 capsule (60 mg total) by mouth 2 (two) times daily., Disp: 180 capsule, Rfl: 3 .  metoprolol succinate  (TOPROL-XL) 25 MG 24 hr tablet, Take 1 tablet (25 mg total) by mouth daily. for blood pressure, Disp: 90 tablet, Rfl: 3 .  tiZANidine (ZANAFLEX) 4 MG tablet, Take 0.5-1.5 tablets (2-6 mg total) by mouth every 8 (eight) hours as needed for muscle spasms., Disp: 90 tablet, Rfl: 0 .  pregabalin (LYRICA) 25 MG capsule, Take 1 capsule (25 mg total) by mouth 2 (two) times daily. (Patient not taking: Reported on 05/12/2020), Disp: 180 capsule, Rfl: 0  Patient Active Problem List   Diagnosis Date Noted  . Hyperlipidemia 08/12/2019  . Vitamin D deficiency disease 08/12/2019  . Hallucinations, visual 03/09/2019  . Morbid obesity (HCC) 04/14/2018  . Moderate episode of recurrent major depressive disorder (HCC) 04/14/2018  . Aortic insufficiency 03/19/2018  . LVH (left ventricular hypertrophy) 03/19/2018  . Grade I diastolic dysfunction 03/19/2018  . Mitral regurgitation 03/19/2018  . Lumbar canal stenosis 09/14/2017  . Emphysema of lung (HCC) 05/31/2017  . Palpitations 05/30/2017  . Medication monitoring encounter 02/09/2016  . Essential hypertension, benign 05/17/2015  . Acute allergic reaction 05/17/2015  . Situational stress 05/17/2015  . Obesity 03/03/2015  . Abnormal thyroid blood test 03/03/2015  . Tobacco abuse 03/03/2015  . Lumbago   . Fibromyalgia   . Anxiety 10/09/2012    Past Surgical History:  Procedure Laterality Date  . ABDOMINAL HYSTERECTOMY  2000   partial, still has cervix  . CESAREAN SECTION  1996  . LAPAROSCOPIC ABDOMINAL EXPLORATION  1999  . TONSILLECTOMY AND ADENOIDECTOMY  1962    Family History  Problem Relation Age of Onset  . Cancer Mother        colon  . Osteoporosis Mother   . Hyperlipidemia Mother   . Alzheimer's disease Father   . Thyroid disease Son   . Heart disease Brother     Social History   Tobacco Use  . Smoking status: Former Smoker    Packs/day: 0.25    Years: 45.00    Pack years: 11.25    Types: Cigarettes    Quit date: 01/20/2019     Years since quitting: 1.3  . Smokeless tobacco: Never Used  . Tobacco comment: been given a Rx for Chantix quit for 1 yr and started back  Vaping Use  . Vaping Use: Never used  Substance Use Topics  . Alcohol use: Yes    Comment: rare  . Drug use: No     No Known Allergies  Health Maintenance  Topic Date Due  . Fecal DNA (Cologuard)  Never done  . HIV Screening  01/19/2021 (Originally 06/27/1971)  . MAMMOGRAM  01/22/2021  . PAP SMEAR-Modifier  04/29/2021  . TETANUS/TDAP  09/02/2025  . INFLUENZA VACCINE  Completed  . COVID-19 Vaccine  Completed  . Hepatitis  C Screening  Completed    Chart Review Today: I personally reviewed active problem list, medication list, allergies, family history, social history, health maintenance, notes from last encounter, lab results, imaging with the patient/caregiver today.   Review of Systems  10 Systems reviewed and are negative for acute change except as noted in the HPI.  Objective:   Vitals:   05/12/20 1130  BP: 128/84  Pulse: 76  Resp: 16  Temp: 99.4 F (37.4 C)  TempSrc: Oral  SpO2: 97%  Weight: 192 lb (87.1 kg)  Height: 5\' 4"  (1.626 m)    Body mass index is 32.96 kg/m.  Physical Exam Vitals and nursing note reviewed.  Constitutional:      General: She is not in acute distress.    Appearance: Normal appearance. She is well-developed. She is obese. She is diaphoretic. She is not ill-appearing or toxic-appearing.     Interventions: Face mask in place.  HENT:     Head: Normocephalic and atraumatic.     Right Ear: External ear normal.     Left Ear: External ear normal.  Eyes:     General: Lids are normal. No scleral icterus.       Right eye: No discharge.        Left eye: No discharge.     Conjunctiva/sclera: Conjunctivae normal.  Neck:     Trachea: Phonation normal. No tracheal deviation.  Cardiovascular:     Rate and Rhythm: Normal rate and regular rhythm.     Pulses: Normal pulses.          Radial pulses are 2+ on  the right side and 2+ on the left side.       Posterior tibial pulses are 2+ on the right side and 2+ on the left side.     Heart sounds: Normal heart sounds. No murmur heard.  No friction rub. No gallop.   Pulmonary:     Effort: Pulmonary effort is normal. No respiratory distress.     Breath sounds: Normal breath sounds. No stridor. No wheezing, rhonchi or rales.  Chest:     Chest wall: No tenderness.  Abdominal:     General: Bowel sounds are normal. There is no distension.     Palpations: Abdomen is soft.  Musculoskeletal:     Right lower leg: No edema.     Left lower leg: No edema.  Skin:    General: Skin is warm.     Coloration: Skin is not jaundiced or pale.     Findings: No rash.  Neurological:     Mental Status: She is alert.     Motor: No abnormal muscle tone.     Gait: Gait normal.  Psychiatric:        Mood and Affect: Mood normal.        Speech: Speech normal.        Behavior: Behavior normal.         Assessment & Plan:      ICD-10-CM   1. Mixed hyperlipidemia  E78.2 COMPLETE METABOLIC PANEL WITH GFR   compliant with statin, last labs show LDL control was improving, she continues to work on diet/lifestyle and loosing weight - recheck next OV  2. Essential hypertension, benign  I10 COMPLETE METABOLIC PANEL WITH GFR   stable, well controlled  3. Prediabetes  R73.03 COMPLETE METABOLIC PANEL WITH GFR    Hemoglobin A1c   pt working hard on diet and weight loss, down 4 more lbs, recheck A1C due  to lifestyle efforts  4. Moderate episode of recurrent major depressive disorder (HCC)  F33.1    cymbalta high dose, she is still feeling stressed, but depressive sx are currently well controlled  5. Vitamin D deficiency  E55.9 COMPLETE METABOLIC PANEL WITH GFR    VITAMIN D 25 Hydroxy (Vit-D Deficiency, Fractures)   completed high dose and started OTC supplement, outdoors in sun more - recheck to ensure improvement, if >20 no need to continue to monitor  6. Class 1 obesity  with serious comorbidity and body mass index (BMI) of 32.0 to 32.9 in adult, unspecified obesity type  E66.9    Z68.32    with multiple comorbidities- HTN, HLD, chronic pain syndrome, prediabetes- working on weight loss  7. Screening for colon cancer  Z12.11    will need to see GI - mother had hx of CRC  8. Need for influenza vaccination  Z23 Flu Vaccine QUAD 6+ mos PF IM (Fluarix Quad PF)   done today- pt observed for more than 15 min afterwards and tolerated w/o concerning vaccine SE  9. Fibromyalgia  M79.7 pregabalin (LYRICA) 50 MG capsule   discussed cymbalta dosing, since MDD and anxiety have indication for high dose usage will increase, but discussed how may not help pain, and lyrica may help  10. Chronic low back pain, unspecified back pain laterality, unspecified whether sciatica present  M54.5 pregabalin (LYRICA) 50 MG capsule   G89.29    pain worse with rain and weather/season changes - increase lyrica to 50 mg BID  11. Encounter for chronic pain management  G89.29 pregabalin (LYRICA) 50 MG capsule   increase lyrica dose  12. Need for shingles vaccine  Z23 Varicella-zoster vaccine IM (Shingrix)  13. Encounter for smoking cessation counseling  Z71.6 varenicline (CHANTIX STARTING MONTH PAK) 0.5 MG X 11 & 1 MG X 42 tablet    varenicline (CHANTIX) 1 MG tablet  14. Pulmonary emphysema, unspecified emphysema type (HCC) Chronic J43.9    pulmonary sx currently stable, no current wheeze or DOE - continue current tx, continue to avoid smoke, d/c smoking, chantix prescribed      Return in about 6 months (around 11/09/2020) for Routine follow-up.   Danelle BerryLeisa Torien Ramroop, PA-C 05/12/20 11:54 AM

## 2020-05-11 NOTE — Patient Instructions (Signed)
Jenna Meyers , Thank you for taking time to come for your Medicare Wellness Visit. I appreciate your ongoing commitment to your health goals. Please review the following plan we discussed and let me know if I can assist you in the future.   Screening recommendations/referrals: Colonoscopy: due Mammogram: done 01/23/20 Bone Density: due at age 64 Recommended yearly ophthalmology/optometry visit for glaucoma screening and checkup Recommended yearly dental visit for hygiene and checkup  Vaccinations: Influenza vaccine: due Pneumococcal vaccine: due at age 69 Tdap vaccine: done 09/03/15 Shingles vaccine: due for second dose  Covid-19: done 02/25/20 & 03/24/20  Advanced directives: Advance directive discussed with you today. I have provided a copy for you to complete at home and have notarized. Once this is complete please bring a copy in to our office so we can scan it into your chart.  Conditions/risks identified: Recommend increasing physical activity   Next appointment: Follow up in one year for your annual wellness visit.   Preventive Care 40-64 Years, Female Preventive care refers to lifestyle choices and visits with your health care provider that can promote health and wellness. What does preventive care include?  A yearly physical exam. This is also called an annual well check.  Dental exams once or twice a year.  Routine eye exams. Ask your health care provider how often you should have your eyes checked.  Personal lifestyle choices, including:  Daily care of your teeth and gums.  Regular physical activity.  Eating a healthy diet.  Avoiding tobacco and drug use.  Limiting alcohol use.  Practicing safe sex.  Taking low-dose aspirin daily starting at age 77.  Taking vitamin and mineral supplements as recommended by your health care provider. What happens during an annual well check? The services and screenings done by your health care provider during your annual well check  will depend on your age, overall health, lifestyle risk factors, and family history of disease. Counseling  Your health care provider may ask you questions about your:  Alcohol use.  Tobacco use.  Drug use.  Emotional well-being.  Home and relationship well-being.  Sexual activity.  Eating habits.  Work and work Statistician.  Method of birth control.  Menstrual cycle.  Pregnancy history. Screening  You may have the following tests or measurements:  Height, weight, and BMI.  Blood pressure.  Lipid and cholesterol levels. These may be checked every 5 years, or more frequently if you are over 1 years old.  Skin check.  Lung cancer screening. You may have this screening every year starting at age 36 if you have a 30-pack-year history of smoking and currently smoke or have quit within the past 15 years.  Fecal occult blood test (FOBT) of the stool. You may have this test every year starting at age 41.  Flexible sigmoidoscopy or colonoscopy. You may have a sigmoidoscopy every 5 years or a colonoscopy every 10 years starting at age 11.  Hepatitis C blood test.  Hepatitis B blood test.  Sexually transmitted disease (STD) testing.  Diabetes screening. This is done by checking your blood sugar (glucose) after you have not eaten for a while (fasting). You may have this done every 1-3 years.  Mammogram. This may be done every 1-2 years. Talk to your health care provider about when you should start having regular mammograms. This may depend on whether you have a family history of breast cancer.  BRCA-related cancer screening. This may be done if you have a family history of breast, ovarian,  tubal, or peritoneal cancers.  Pelvic exam and Pap test. This may be done every 3 years starting at age 50. Starting at age 2, this may be done every 5 years if you have a Pap test in combination with an HPV test.  Bone density scan. This is done to screen for osteoporosis. You may  have this scan if you are at high risk for osteoporosis. Discuss your test results, treatment options, and if necessary, the need for more tests with your health care provider. Vaccines  Your health care provider may recommend certain vaccines, such as:  Influenza vaccine. This is recommended every year.  Tetanus, diphtheria, and acellular pertussis (Tdap, Td) vaccine. You may need a Td booster every 10 years.  Zoster vaccine. You may need this after age 33.  Pneumococcal 13-valent conjugate (PCV13) vaccine. You may need this if you have certain conditions and were not previously vaccinated.  Pneumococcal polysaccharide (PPSV23) vaccine. You may need one or two doses if you smoke cigarettes or if you have certain conditions. Talk to your health care provider about which screenings and vaccines you need and how often you need them. This information is not intended to replace advice given to you by your health care provider. Make sure you discuss any questions you have with your health care provider. Document Released: 09/03/2015 Document Revised: 04/26/2016 Document Reviewed: 06/08/2015 Elsevier Interactive Patient Education  2017 [place] Prevention in the Home Falls can cause injuries. They can happen to people of all ages. There are many things you can do to make your home safe and to help prevent falls. What can I do on the outside of my home?  Regularly fix the edges of walkways and driveways and fix any cracks.  Remove anything that might make you trip as you walk through a door, such as a raised step or threshold.  Trim any bushes or trees on the path to your home.  Use bright outdoor lighting.  Clear any walking paths of anything that might make someone trip, such as rocks or tools.  Regularly check to see if handrails are loose or broken. Make sure that both sides of any steps have handrails.  Any raised decks and porches should have guardrails on the  edges.  Have any leaves, snow, or ice cleared regularly.  Use sand or salt on walking paths during winter.  Clean up any spills in your garage right away. This includes oil or grease spills. What can I do in the bathroom?  Use night lights.  Install grab bars by the toilet and in the tub and shower. Do not use towel bars as grab bars.  Use non-skid mats or decals in the tub or shower.  If you need to sit down in the shower, use a plastic, non-slip stool.  Keep the floor dry. Clean up any water that spills on the floor as soon as it happens.  Remove soap buildup in the tub or shower regularly.  Attach bath mats securely with double-sided non-slip rug tape.  Do not have throw rugs and other things on the floor that can make you trip. What can I do in the bedroom?  Use night lights.  Make sure that you have a light by your bed that is easy to reach.  Do not use any sheets or blankets that are too big for your bed. They should not hang down onto the floor.  Have a firm chair that has side arms.  You can use this for support while you get dressed.  Do not have throw rugs and other things on the floor that can make you trip. What can I do in the kitchen?  Clean up any spills right away.  Avoid walking on wet floors.  Keep items that you use a lot in easy-to-reach places.  If you need to reach something above you, use a strong step stool that has a grab bar.  Keep electrical cords out of the way.  Do not use floor polish or wax that makes floors slippery. If you must use wax, use non-skid floor wax.  Do not have throw rugs and other things on the floor that can make you trip. What can I do with my stairs?  Do not leave any items on the stairs.  Make sure that there are handrails on both sides of the stairs and use them. Fix handrails that are broken or loose. Make sure that handrails are as long as the stairways.  Check any carpeting to make sure that it is firmly  attached to the stairs. Fix any carpet that is loose or worn.  Avoid having throw rugs at the top or bottom of the stairs. If you do have throw rugs, attach them to the floor with carpet tape.  Make sure that you have a light switch at the top of the stairs and the bottom of the stairs. If you do not have them, ask someone to add them for you. What else can I do to help prevent falls?  Wear shoes that:  Do not have high heels.  Have rubber bottoms.  Are comfortable and fit you well.  Are closed at the toe. Do not wear sandals.  If you use a stepladder:  Make sure that it is fully opened. Do not climb a closed stepladder.  Make sure that both sides of the stepladder are locked into place.  Ask someone to hold it for you, if possible.  Clearly mark and make sure that you can see:  Any grab bars or handrails.  First and last steps.  Where the edge of each step is.  Use tools that help you move around (mobility aids) if they are needed. These include:  Canes.  Walkers.  Scooters.  Crutches.  Turn on the lights when you go into a dark area. Replace any light bulbs as soon as they burn out.  Set up your furniture so you have a clear path. Avoid moving your furniture around.  If any of your floors are uneven, fix them.  If there are any pets around you, be aware of where they are.  Review your medicines with your doctor. Some medicines can make you feel dizzy. This can increase your chance of falling. Ask your doctor what other things that you can do to help prevent falls. This information is not intended to replace advice given to you by your health care provider. Make sure you discuss any questions you have with your health care provider. Document Released: 06/03/2009 Document Revised: 01/13/2016 Document Reviewed: 09/11/2014 Elsevier Interactive Patient Education  2017 [employer].

## 2020-05-11 NOTE — Progress Notes (Signed)
Subjective:   Jenna Meyers is a 64 y.o. female who presents for Medicare Annual (Subsequent) preventive examination.  Virtual Visit via Telephone Note  I connected with  Kathrin Penner on 05/11/20 at 10:00 AM EDT by telephone and verified that I am speaking with the correct person using two identifiers.  Medicare Annual Wellness visit completed telephonically due to Covid-19 pandemic.   Location: Patient: home Provider: [facility]   I discussed the limitations, risks, security and privacy concerns of performing an evaluation and management service by telephone and the availability of in person appointments. The patient expressed understanding and agreed to proceed.  Unable to perform video visit due to video visit attempted and failed and/or patient does not have video capability.   Some vital signs may be absent or patient reported.   Reather Littler, LPN    Review of Systems     Cardiac Risk Factors include: dyslipidemia;hypertension;smoking/ tobacco exposure     Objective:    Today's Vitals   05/11/20 1019  PainSc: 3    There is no height or weight on file to calculate BMI.  Advanced Directives 05/11/2020 05/08/2019 02/13/2019 01/06/2019 04/23/2018 05/30/2017 05/30/2017  Does Patient Have a Medical Advance Directive? No No No No No No No  Would patient like information on creating a medical advance directive? Yes (MAU/Ambulatory/Procedural Areas - Information given) Yes (MAU/Ambulatory/Procedural Areas - Information given) No - Patient declined No - Patient declined Yes (MAU/Ambulatory/Procedural Areas - Information given) Yes (Inpatient - patient requests chaplain consult to create a medical advance directive) -    Current Medications (verified) Outpatient Encounter Medications as of 05/11/2020  Medication Sig  . albuterol (PROVENTIL HFA;VENTOLIN HFA) 108 (90 Base) MCG/ACT inhaler INHALE 2 PUFFS BY MOUTH  EVERY 4 HOURS AS NEEDED FOR WHEEZING OR SHORTNESS OF  BREATH  . aspirin  EC 81 MG tablet Take 1 tablet (81 mg total) by mouth daily. Take at least one hour before naproxen  . atorvastatin (LIPITOR) 80 MG tablet Take 1 tablet (80 mg total) by mouth at bedtime.  . DULoxetine (CYMBALTA) 60 MG capsule Take 1 capsule (60 mg total) by mouth 2 (two) times daily.  . metoprolol succinate (TOPROL-XL) 25 MG 24 hr tablet Take 1 tablet (25 mg total) by mouth daily. for blood pressure  . pregabalin (LYRICA) 25 MG capsule Take 1 capsule (25 mg total) by mouth 2 (two) times daily.  Marland Kitchen tiZANidine (ZANAFLEX) 4 MG tablet Take 0.5-1.5 tablets (2-6 mg total) by mouth every 8 (eight) hours as needed for muscle spasms.  . Cholecalciferol (VITAMIN D3) 125 MCG (5000 UT) CAPS Take by mouth. Pt takes once a week (Patient not taking: Reported on 05/11/2020)  . [DISCONTINUED] pregabalin (LYRICA) 25 MG capsule Take 1 capsule (25 mg total) by mouth 2 (two) times daily.  . [DISCONTINUED] Vitamin D, Ergocalciferol, (DRISDOL) 1.25 MG (50000 UNIT) CAPS capsule Take 1 capsule (50,000 Units total) by mouth every 7 (seven) days. x12 weeks.   No facility-administered encounter medications on file as of 05/11/2020.    Allergies (verified) Patient has no known allergies.   History: Past Medical History:  Diagnosis Date  . Anxiety 10/09/2012  . Aortic insufficiency 03/19/2018   ECHO July 2019  . Arthritis    back  . Depression   . Emphysema lung (HCC)   . Emphysema of lung (HCC)   . Fibromyalgia   . Grade I diastolic dysfunction 03/19/2018   ECHO July 2019  . Hyperlipidemia   . Hypertension   .  IBS (irritable bowel syndrome)   . Loss of consciousness (HCC) 03/09/2019  . Lumbago   . Lumbar canal stenosis 09/14/2017   Moderate to severe, MRI Nov 2018  . LVH (left ventricular hypertrophy) 03/19/2018   ECHO July 2019  . Mitral regurgitation 03/19/2018   ECHO July 2019  . Tobacco abuse   . Vitamin D deficiency disease    Past Surgical History:  Procedure Laterality Date  . ABDOMINAL HYSTERECTOMY   2000   partial, still has cervix  . CESAREAN SECTION  1996  . LAPAROSCOPIC ABDOMINAL EXPLORATION  1999  . TONSILLECTOMY AND ADENOIDECTOMY  1962   Family History  Problem Relation Age of Onset  . Cancer Mother        colon  . Osteoporosis Mother   . Hyperlipidemia Mother   . Alzheimer's disease Father   . Thyroid disease Son   . Heart disease Brother    Social History   Socioeconomic History  . Marital status: Divorced    Spouse name: Not on file  . Number of children: 1  . Years of education: Not on file  . Highest education level: Associate degree: academic program  Occupational History  . Occupation: Disabled  Tobacco Use  . Smoking status: Former Smoker    Packs/day: 0.25    Years: 45.00    Pack years: 11.25    Types: Cigarettes    Quit date: 01/20/2019    Years since quitting: 1.3  . Smokeless tobacco: Never Used  . Tobacco comment: been given a Rx for Chantix quit for 1 yr and started back  Vaping Use  . Vaping Use: Never used  Substance and Sexual Activity  . Alcohol use: Yes    Comment: rare  . Drug use: No  . Sexual activity: Not Currently    Partners: Male  Other Topics Concern  . Not on file  Social History Narrative   Pt lives with her son and daughter in law and 2 small grandchildren   Social Determinants of Health   Financial Resource Strain: Low Risk   . Difficulty of Paying Living Expenses: Not hard at all  Food Insecurity: No Food Insecurity  . Worried About Programme researcher, broadcasting/film/video in the Last Year: Never true  . Ran Out of Food in the Last Year: Never true  Transportation Needs: No Transportation Needs  . Lack of Transportation (Medical): No  . Lack of Transportation (Non-Medical): No  Physical Activity: Inactive  . Days of Exercise per Week: 0 days  . Minutes of Exercise per Session: 0 min  Stress: Stress Concern Present  . Feeling of Stress : To some extent  Social Connections: Unknown  . Frequency of Communication with Friends and Family:  Not on file  . Frequency of Social Gatherings with Friends and Family: Twice a week  . Attends Religious Services: Never  . Active Member of Clubs or Organizations: No  . Attends Banker Meetings: Never  . Marital Status: Divorced    Tobacco Counseling Counseling given: Not Answered Comment: been given a Rx for Chantix quit for 1 yr and started back   Clinical Intake:  Pre-visit preparation completed: Yes  Pain : 0-10 Pain Score: 3  Pain Type: Chronic pain Pain Location: Back (hips and shoulders) Pain Orientation: Lower Pain Descriptors / Indicators: Aching, Sore, Discomfort Pain Onset: More than a month ago Pain Frequency: Constant     Nutritional Risks: None Diabetes: No  How often do you need to have someone  help you when you read instructions, pamphlets, or other written materials from your doctor or pharmacy?: 1 - Never    Interpreter Needed?: No  Information entered by :: Reather Littler LPN   Activities of Daily Living In your present state of health, do you have any difficulty performing the following activities: 05/11/2020 01/20/2020  Hearing? N N  Comment declines hearing aids -  Vision? N N  Difficulty concentrating or making decisions? N N  Walking or climbing stairs? N N  Dressing or bathing? N N  Doing errands, shopping? N N  Preparing Food and eating ? N -  Using the Toilet? N -  In the past six months, have you accidently leaked urine? N -  Do you have problems with loss of bowel control? N -  Managing your Medications? N -  Managing your Finances? N -  Housekeeping or managing your Housekeeping? N -  Some recent data might be hidden    Patient Care Team: Danelle Berry, PA-C as PCP - General (Family Medicine) Laurier Nancy, MD as Consulting Physician (Cardiology)  Indicate any recent Medical Services you may have received from other than [facility] providers in the past year (date may be approximate).     Assessment:   This is a  routine wellness examination for [facility].  Hearing/Vision screen  Hearing Screening   125Hz  250Hz  500Hz  1000Hz  2000Hz  3000Hz  4000Hz  6000Hz  8000Hz   Right ear:           Left ear:           Comments: Pt denies hearing difficulty   Vision Screening Comments: [facility] screenings [facility] at [facility]  Dietary issues and exercise activities discussed: Current Exercise Habits: The patient does not participate in regular exercise at present, Exercise limited by: None identified  Goals    . DIET - INCREASE WATER INTAKE     Recommend to drink at least 6-8 8oz glasses of water per day.    . Quit Smoking     If you wish to quit smoking, help is available. For free tobacco cessation program offerings call the [facility] at (253)764-9000 or Live Well Line at 907-868-7731. You may also visit [email or web address] or email [email or web address] for more information on other programs.        Depression Screen PHQ 2/9 Scores 05/11/2020 01/20/2020 11/24/2019 08/01/2019 05/08/2019 01/23/2019 01/09/2019  PHQ - 2 Score 0 0 1 2 2  0 1  PHQ- 9 Score - 4 1 4 6  0 -    Fall Risk Fall Risk  05/11/2020 01/20/2020 11/24/2019 08/01/2019 05/08/2019  Falls in the past year? 0 0 0 0 0  Number falls in past yr: 0 0 0 0 0  Injury with Fall? 0 0 0 0 0  Risk for fall due to : No Fall Risks - - - -  Risk for fall due to: Comment - - - - -  Follow up Falls prevention discussed - - - Falls prevention discussed    Any stairs in or around the home? No  If so, are there any without handrails? No  Home free of loose throw rugs in walkways, pet beds, electrical cords, etc? Yes  Adequate lighting in your home to reduce risk of falls? Yes   ASSISTIVE DEVICES UTILIZED TO PREVENT FALLS:  Life alert? No  Use of a cane, walker or w/c? No  Grab bars in the bathroom? Yes  Shower chair or bench in shower? No  Elevated toilet seat or a handicapped toilet? No   TIMED UP AND GO:  Was the test performed? No  . Telephonic visit.   Cognitive Function: 6CIT deferred for 2021 AWV; pt states no memory issues.      6CIT Screen 05/08/2019 04/23/2018  What Year? 0 points 0 points  What month? 0 points 0 points  What time? 0 points 0 points  Count back from 20 0 points 0 points  Months in reverse 0 points 0 points  Repeat phrase 0 points 0 points  Total Score 0 0    Immunizations Immunization History  Administered Date(s) Administered  . Influenza Inj Mdck Quad Pf 06/14/2017  . Influenza,inj,Quad PF,6+ Mos 04/04/2018  . Moderna SARS-COVID-2 Vaccination 02/25/2020, 03/24/2020  . Tdap 09/03/2015  . Zoster 08/21/2013  . Zoster Recombinat (Shingrix) 04/04/2018    TDAP status: Up to date   Flu Vaccine status: Declined, Education has been provided regarding the importance of this vaccine but patient still declined. Advised may receive this vaccine at local pharmacy or Health Dept. Aware to provide a copy of the vaccination record if obtained from local pharmacy or Health Dept. Verbalized acceptance and understanding.   Pneumococcal vaccine: due at age 64  Covid-19 vaccine status: Completed vaccines  Qualifies for Shingles Vaccine? Yes   Zostavax completed Yes   Shingrix Completed?: Yes  Screening Tests Health Maintenance  Topic Date Due  . Fecal DNA (Cologuard)  Never done  . INFLUENZA VACCINE  03/21/2020  . HIV Screening  01/19/2021 (Originally 06/27/1971)  . MAMMOGRAM  01/22/2021  . PAP SMEAR-Modifier  04/29/2021  . TETANUS/TDAP  09/02/2025  . COVID-19 Vaccine  Completed  . Hepatitis C Screening  Completed    Health Maintenance  Health Maintenance Due  Topic Date Due  . Fecal DNA (Cologuard)  Never done  . INFLUENZA VACCINE  03/21/2020   Colorectal Cancer Screening: pt states she had colonoscopy at age 64 but not since and did not complete Cologuard. Pt plans to discuss with [name] at appt tomorrow.   Mammogram status: Completed 01/23/20. Repeat every year    Bone density  status: due at age 64  Lung Cancer Screening: (Low Dose CT Chest recommended if Age 32-80 years, 30 pack-year currently smoking OR have quit w/in 15years.) does qualify.   Lung Cancer Screening Referral: An Epic message has been sent to Glenna FellowsShawn Perkins, RN (Oncology Nurse Navigator) regarding the possible need for this exam. Ines BloomerShawn will review the patient's chart to determine if the patient truly qualifies for the exam. If the patient qualifies, Ines BloomerShawn will order the Low Dose CT of the chest to facilitate the scheduling of this exam.    Additional Screening:  Hepatitis C Screening: does qualify; Completed 09/03/15  Vision Screening: Recommended annual ophthalmology exams for early detection of glaucoma and other disorders of the eye. Is the patient up to date with their annual eye exam?  Yes  Who is the provider or what is the name of the office in which the patient attends annual eye exams? Dr. Larence PenningNice  Dental Screening: Recommended annual dental exams for proper oral hygiene  Community Resource Referral / Chronic Care Management: CRR required this visit?  No   CCM required this visit?  No      Plan:     I have personally reviewed and noted the following in the patient's chart:   . Medical and social history . Use of alcohol, tobacco or illicit drugs  . Current medications and supplements .  Functional ability and status . Nutritional status . Physical activity . Advanced directives . List of other physicians . Hospitalizations, surgeries, and ER visits in previous 12 months . Vitals . Screenings to include cognitive, depression, and falls . Referrals and appointments  In addition, I have reviewed and discussed with patient certain preventive protocols, quality metrics, and best practice recommendations. A written personalized care plan for preventive services as well as general preventive health recommendations were provided to patient.     Reather Littler, LPN   [id number]/61/[id number]    Nurse Notes: pt advised will need to repeat labs at tomorrow's appt and does need to be taking vit d supplement 2000 IU daily. Pt to discuss colonoscopy options at appt tomorrow as well. She reports changing eating habits and has had less sxs with IBS lately.

## 2020-05-12 ENCOUNTER — Encounter: Payer: Self-pay | Admitting: Family Medicine

## 2020-05-12 ENCOUNTER — Ambulatory Visit (INDEPENDENT_AMBULATORY_CARE_PROVIDER_SITE_OTHER): Payer: Medicare PPO | Admitting: Family Medicine

## 2020-05-12 ENCOUNTER — Other Ambulatory Visit: Payer: Self-pay

## 2020-05-12 VITALS — BP 128/84 | HR 76 | Temp 99.4°F | Resp 16 | Ht 64.0 in | Wt 192.0 lb

## 2020-05-12 DIAGNOSIS — Z6832 Body mass index (BMI) 32.0-32.9, adult: Secondary | ICD-10-CM

## 2020-05-12 DIAGNOSIS — Z1211 Encounter for screening for malignant neoplasm of colon: Secondary | ICD-10-CM

## 2020-05-12 DIAGNOSIS — F331 Major depressive disorder, recurrent, moderate: Secondary | ICD-10-CM

## 2020-05-12 DIAGNOSIS — R7303 Prediabetes: Secondary | ICD-10-CM

## 2020-05-12 DIAGNOSIS — M797 Fibromyalgia: Secondary | ICD-10-CM

## 2020-05-12 DIAGNOSIS — E669 Obesity, unspecified: Secondary | ICD-10-CM

## 2020-05-12 DIAGNOSIS — I10 Essential (primary) hypertension: Secondary | ICD-10-CM

## 2020-05-12 DIAGNOSIS — Z23 Encounter for immunization: Secondary | ICD-10-CM

## 2020-05-12 DIAGNOSIS — E782 Mixed hyperlipidemia: Secondary | ICD-10-CM

## 2020-05-12 DIAGNOSIS — M545 Low back pain, unspecified: Secondary | ICD-10-CM

## 2020-05-12 DIAGNOSIS — G8929 Other chronic pain: Secondary | ICD-10-CM

## 2020-05-12 DIAGNOSIS — E559 Vitamin D deficiency, unspecified: Secondary | ICD-10-CM

## 2020-05-12 DIAGNOSIS — J439 Emphysema, unspecified: Secondary | ICD-10-CM

## 2020-05-12 DIAGNOSIS — Z716 Tobacco abuse counseling: Secondary | ICD-10-CM

## 2020-05-12 MED ORDER — PREGABALIN 50 MG PO CAPS: 50.0000 mg | ORAL_CAPSULE | Freq: Two times a day (BID) | ORAL | 0 refills | Status: DC

## 2020-05-12 MED ORDER — CHANTIX STARTING MONTH PAK 0.5 MG X 11 & 1 MG X 42 PO TABS: ORAL_TABLET | ORAL | 0 refills | Status: DC

## 2020-05-12 MED ORDER — VARENICLINE TARTRATE 1 MG PO TABS: 1.0000 mg | ORAL_TABLET | Freq: Two times a day (BID) | ORAL | 4 refills | Status: DC

## 2020-05-12 NOTE — Patient Instructions (Signed)
Let me know about what program they say is available and covered by medicare.   Prediabetes Prediabetes is the condition of having a blood sugar (blood glucose) level that is higher than it should be, but not high enough for you to be diagnosed with type 2 diabetes. Having prediabetes puts you at risk for developing type 2 diabetes (type 2 diabetes mellitus). Prediabetes may be called impaired glucose tolerance or impaired fasting glucose. Prediabetes usually does not cause symptoms. Your health care provider can diagnose this condition with blood tests. You may be tested for prediabetes if you are overweight and if you have at least one other risk factor for prediabetes. What is blood glucose, and how is it measured? Blood glucose refers to the amount of glucose in your bloodstream. Glucose comes from eating foods that contain sugars and starches (carbohydrates), which the body breaks down into glucose. Your blood glucose level may be measured in mg/dL (milligrams per deciliter) or mmol/L (millimoles per liter). Your blood glucose may be checked with one or more of the following blood tests:  A fasting blood glucose (FBG) test. You will not be allowed to eat (you will fast) for 8 hours or longer before a blood sample is taken. ? A normal range for FBG is 70-100 mg/dl (6.9-6.2 mmol/L).  An A1c (hemoglobin A1c) blood test. This test provides information about blood glucose control over the previous 2?75months.  An oral glucose tolerance test (OGTT). This test measures your blood glucose at two times: ? After fasting. This is your baseline level. ? Two hours after you drink a beverage that contains glucose. You may be diagnosed with prediabetes:  If your FBG is 100?125 mg/dL (9.5-2.8 mmol/L).  If your A1c level is 5.7?6.4%.  If your OGTT result is 140?199 mg/dL (4.1-32 mmol/L). These blood tests may be repeated to confirm your diagnosis. How can this condition affect me? The pancreas produces  a hormone (insulin) that helps to move glucose from the bloodstream into cells. When cells in the body do not respond properly to insulin that the body makes (insulin resistance), excess glucose builds up in the blood instead of going into cells. As a result, high blood glucose (hyperglycemia) can develop, which can cause many complications. Hyperglycemia is a symptom of prediabetes. Having high blood glucose for a long time is dangerous. Too much glucose in your blood can damage your nerves and blood vessels. Long-term damage can lead to complications from diabetes, which may include:  Heart disease.  Stroke.  Blindness.  Kidney disease.  Depression.  Poor circulation in the feet and legs, which could lead to surgical removal (amputation) in severe cases. What can increase my risk? Risk factors for prediabetes include:  Having a family member with type 2 diabetes.  Being overweight or obese.  Being older than age 10.  Being of American [place], African-American, Hispanic/Latino, or Asian/Pacific Islander descent.  Having an inactive (sedentary) lifestyle.  Having a history of heart disease.  History of gestational diabetes or polycystic ovary syndrome (PCOS), in women.  Having low levels of good cholesterol (HDL-C) or high levels of blood fats (triglycerides).  Having high blood pressure. What actions can I take to prevent diabetes?      Be physically active. ? Do moderate-intensity physical activity for 30 or more minutes on 5 or more days of the week, or as much as told by your health care provider. This could be brisk walking, biking, or water aerobics. ? Ask your health care provider  what activities are safe for you. A mix of physical activities may be best, such as walking, swimming, cycling, and strength training.  Lose weight as told by your health care provider. ? Losing 5-7% of your body weight can reverse insulin resistance. ? Your health care provider can  determine how much weight loss is best for you and can help you lose weight safely.  Follow a healthy meal plan. This includes eating lean proteins, complex carbohydrates, fresh fruits and vegetables, low-fat dairy products, and healthy fats. ? Follow instructions from your health care provider about eating or drinking restrictions. ? Make an appointment to see a diet and nutrition specialist (registered dietitian) to help you create a healthy eating plan that is right for you.  Do not smoke or use any tobacco products, such as cigarettes, chewing tobacco, and e-cigarettes. If you need help quitting, ask your health care provider.  Take over-the-counter and prescription medicines as told by your health care provider. You may be prescribed medicines that help lower the risk of type 2 diabetes.  Keep all follow-up visits as told by your health care provider. This is important. Summary  Prediabetes is the condition of having a blood sugar (blood glucose) level that is higher than it should be, but not high enough for you to be diagnosed with type 2 diabetes.  Having prediabetes puts you at risk for developing type 2 diabetes (type 2 diabetes mellitus).  To help prevent type 2 diabetes, make lifestyle changes such as being physically active and eating a healthy diet. Lose weight as told by your health care provider. This information is not intended to replace advice given to you by your health care provider. Make sure you discuss any questions you have with your health care provider. Document Revised: 11/29/2018 Document Reviewed: 09/28/2015 Elsevier Patient Education  2020 [employer].

## 2020-05-13 ENCOUNTER — Telehealth: Payer: Self-pay

## 2020-05-13 LAB — COMPLETE METABOLIC PANEL WITH GFR
AG Ratio: 1.7 (calc) (ref 1.0–2.5)
ALT: 13 U/L (ref 6–29)
AST: 16 U/L (ref 10–35)
Albumin: 4.3 g/dL (ref 3.6–5.1)
Alkaline phosphatase (APISO): 69 U/L (ref 37–153)
BUN: 13 mg/dL (ref 7–25)
CO2: 21 mmol/L (ref 20–32)
Calcium: 9.4 mg/dL (ref 8.6–10.4)
Chloride: 108 mmol/L (ref 98–110)
Creat: 0.88 mg/dL (ref 0.50–0.99)
GFR, Est African American: 82 mL/min/{1.73_m2} (ref >=60)
GFR, Est Non African American: 70 mL/min/{1.73_m2} (ref >=60)
Globulin: 2.6 g/dL (calc) (ref 1.9–3.7)
Glucose, Bld: 89 mg/dL (ref 65–99)
Potassium: 4.8 mmol/L (ref 3.5–5.3)
Sodium: 139 mmol/L (ref 135–146)
Total Bilirubin: 0.5 mg/dL (ref 0.2–1.2)
Total Protein: 6.9 g/dL (ref 6.1–8.1)

## 2020-05-13 LAB — VITAMIN D 25 HYDROXY (VIT D DEFICIENCY, FRACTURES): Vit D, 25-Hydroxy: 26 ng/mL — ABNORMAL LOW (ref 30–100)

## 2020-05-13 LAB — HEMOGLOBIN A1C
Hgb A1c MFr Bld: 5.7 % of total Hgb — ABNORMAL HIGH (ref <5.7)
Mean Plasma Glucose: 117 (calc)
eAG (mmol/L): 6.5 (calc)

## 2020-05-13 NOTE — Telephone Encounter (Signed)
Contacted patient for lung CT screening clinic based on referral from Danelle Berry.  Patient agreeable to participate in program and CT scheduled for Tuesday, Oct 19 at 1:15.  She was given address to imaging center and phone number to [facility], lung navigator.  Patient is a current smoker.  She started smoking at age 64 and quit for about 15 years at age 64.  She restarted smoking at age 37 and was able to quit about 1 year ago with the help of chantix.  She has since begun smoking again.  She smokes less than a pack a day, around 50%.

## 2020-05-14 ENCOUNTER — Encounter: Payer: Self-pay | Admitting: *Deleted

## 2020-05-14 ENCOUNTER — Other Ambulatory Visit: Payer: Self-pay | Admitting: *Deleted

## 2020-05-14 DIAGNOSIS — Z122 Encounter for screening for malignant neoplasm of respiratory organs: Secondary | ICD-10-CM

## 2020-05-14 DIAGNOSIS — Z87891 Personal history of nicotine dependence: Secondary | ICD-10-CM

## 2020-05-14 NOTE — Progress Notes (Signed)
Confirmed smoking history: current smoker, 51 pack year

## 2020-05-24 NOTE — Progress Notes (Signed)
Patient: Jenna Meyers, Female    DOB: [birth year], 64 y.o.   MRN: [id number] Delsa Grana, PA-C Visit Date: 05/25/2020  Today's Provider: Delsa Grana, PA-C   Chief Complaint  Patient presents with  . Annual Exam   Subjective:   Annual physical exam:  Jenna Meyers is a 64 y.o. female who presents today for complete physical exam:  Exercise/Activity:  Limited due to multiple pain/ortho issues Diet/nutrition:  Still working very diligently on healthy diet  Sleep: Sleeping well    USPSTF grade A and B recommendations - reviewed and addressed today  Depression:  Phq 9 completed today by patient, was reviewed by me with patient in the room PHQ score is neg, pt feels good PHQ 2/9 Scores 05/25/2020 05/11/2020 01/20/2020 11/24/2019  PHQ - 2 Score 2 0 0 1  PHQ- 9 Score - - 4 1   Depression screen [facility] 2/9 05/25/2020 05/11/2020 01/20/2020 11/24/2019 08/01/2019  Decreased Interest 1 0 0 0 1  Down, Depressed, Hopeless 1 0 0 1 1  PHQ - 2 Score 2 0 0 1 2  Altered sleeping - - 3 0 1  Tired, decreased energy - - 1 0 1  Change in appetite - - 0 0 0  Feeling bad or failure about yourself  - - 0 0 0  Trouble concentrating - - 0 0 0  Moving slowly or fidgety/restless - - 0 0 0  Suicidal thoughts - - 0 0 0  PHQ-9 Score - - 4 1 4   Difficult doing work/chores - - Not difficult at all Not difficult at all Somewhat difficult  Some recent data might be hidden    Alcohol screening:   Office Visit from 05/25/2020 in [facility]  AUDIT-C Score 0      Immunizations and Health Maintenance: Health Maintenance  Topic Date Due  . COLONOSCOPY  Never done  . MAMMOGRAM  01/22/2021  . PAP SMEAR-Modifier  04/29/2021  . TETANUS/TDAP  09/02/2025  . INFLUENZA VACCINE  Completed  . COVID-19 Vaccine  Completed  . Hepatitis C Screening  Completed  . HIV Screening  Completed     Hep C Screening: done 09/03/15  STD testing and prevention (HIV/chl/gon/syphilis): ordered see above, no  additional testing desired by pt today  Intimate partner violence:denies  Sexual History/Pain during Intercourse: Divorced  Menstrual History/LMP/Abnormal Bleeding: has partial hysterectomy in her 87's  No LMP recorded. Patient has had a hysterectomy.  Incontinence Symptoms:  Mild or rare urge incontinence  Breast cancer:  Last Mammogram: done 01/23/20 see HM list above BRCA gene screening:   None known  Cervical cancer screening: done 04/29/18 Pt denies family hx of cancers - breast, ovarian, uterine      Family hx Colon CA - mother - overdue for colon cancer screening  Osteoporosis:   Discussion on osteoporosis per age, including high calcium and vitamin D supplementation, weight bearing exercises Pt is supplementing with daily calcium/Vit D.  Skin cancer:  Hx of skin CA -  NO   No hx of skin CA Discussed atypical lesions   Colorectal cancer:   GI referral - pt not sure about seeing GI, she hesitant to do colonoscopy, last done as a teenager Discussed concerning signs and sx of CRC, pt denies melena, hematochezia - bowels with IBS have improved with her diet changes and eating more whole foods  Lung cancer:   Low Dose CT Chest recommended if Age 21-80 years, 30 pack-year currently smoking OR have quit  w/in 15years. Patient does qualify.  Is set up to do scan soon  Social History   Tobacco Use  . Smoking status: Current Every Day Smoker    Packs/day: 1.50    Years: 34.00    Pack years: 51.00    Types: Cigarettes  . Smokeless tobacco: Never Used  . Tobacco comment: been given a Rx for Chantix quit for 1 yr and started back  Vaping Use  . Vaping Use: Never used  Substance Use Topics  . Alcohol use: Yes    Comment: rare  . Drug use: No       Office Visit from 05/25/2020 in [facility]  AUDIT-C Score 0      Family History  Problem Relation Age of Onset  . Cancer Mother        colon  . Osteoporosis Mother   . Hyperlipidemia Mother   .  Alzheimer's disease Father   . Thyroid disease Son   . Heart disease Brother      Blood pressure/Hypertension: BP Readings from Last 3 Encounters:  05/25/20 124/76  05/12/20 128/84  01/20/20 128/78    Weight/Obesity: Wt Readings from Last 3 Encounters:  05/25/20 195 lb 1.6 oz (88.5 kg)  05/12/20 192 lb (87.1 kg)  01/20/20 195 lb 8 oz (88.7 kg)   BMI Readings from Last 3 Encounters:  05/25/20 33.49 kg/m  05/12/20 32.96 kg/m  01/20/20 33.56 kg/m     Lipids:  Lab Results  Component Value Date   CHOL 174 11/24/2019   CHOL 188 01/10/2019   CHOL 287 (H) 04/29/2018   Lab Results  Component Value Date   HDL 52 11/24/2019   HDL 44 (L) 01/10/2019   HDL 41 (L) 04/29/2018   Lab Results  Component Value Date   LDLCALC 101 (H) 11/24/2019   LDLCALC 114 (H) 01/10/2019   LDLCALC 195 (H) 04/29/2018   Lab Results  Component Value Date   TRIG 117 11/24/2019   TRIG 181 (H) 01/10/2019   TRIG 287 (H) 04/29/2018   Lab Results  Component Value Date   CHOLHDL 3.3 11/24/2019   CHOLHDL 4.3 01/10/2019   CHOLHDL 7.0 (H) 04/29/2018   No results found for: LDLDIRECT Based on the results of lipid panel his/her cardiovascular risk factor ( using [place] )  in the next 10 years is: The 10-year ASCVD risk score Mikey Bussing DC Brooke Bonito., et al., 2013) is: 9.5%   Values used to calculate the score:     Age: 75 years     Sex: Female     Is Non-Hispanic African American: No     Diabetic: No     Tobacco smoker: Yes     Systolic Blood Pressure: 601 mmHg     Is BP treated: Yes     HDL Cholesterol: 52 mg/dL     Total Cholesterol: 174 mg/dL Glucose:  Glucose  Date Value Ref Range Status  04/17/2014 114 (H) 65 - 99 mg/dL Final  10/24/2013 87 65 - 99 mg/dL Final  10/20/2013 86 65 - 99 mg/dL Final   Glucose, Bld  Date Value Ref Range Status  05/12/2020 89 65 - 99 mg/dL Final    Comment:    .            Fasting reference interval .   11/24/2019 119 (H) 65 - 99 mg/dL Final    Comment:      .            Fasting  reference interval . For someone without known diabetes, a glucose value between 100 and 125 mg/dL is consistent with prediabetes and should be confirmed with a follow-up test. .   02/13/2019 132 (H) 70 - 99 mg/dL Final   Hypertension: BP Readings from Last 3 Encounters:  05/25/20 124/76  05/12/20 128/84  01/20/20 128/78   Obesity: Wt Readings from Last 3 Encounters:  05/25/20 195 lb 1.6 oz (88.5 kg)  05/12/20 192 lb (87.1 kg)  01/20/20 195 lb 8 oz (88.7 kg)   BMI Readings from Last 3 Encounters:  05/25/20 33.49 kg/m  05/12/20 32.96 kg/m  01/20/20 33.56 kg/m      Advanced Care Planning:  A voluntary discussion about advance care planning including the explanation and discussion of advance directives.   Working on completing and getting a Patent examiner   Social History      She        Social History   Socioeconomic History  . Marital status: Divorced    Spouse name: Not on file  . Number of children: 1  . Years of education: Not on file  . Highest education level: Associate degree: academic program  Occupational History  . Occupation: Disabled  Tobacco Use  . Smoking status: Current Every Day Smoker    Packs/day: 1.50    Years: 34.00    Pack years: 51.00    Types: Cigarettes  . Smokeless tobacco: Never Used  . Tobacco comment: been given a Rx for Chantix quit for 1 yr and started back  Vaping Use  . Vaping Use: Never used  Substance and Sexual Activity  . Alcohol use: Yes    Comment: rare  . Drug use: No  . Sexual activity: Not Currently    Partners: Male  Other Topics Concern  . Not on file  Social History Narrative   Pt lives with her son and daughter in law and 2 small grandchildren   Social Determinants of Health   Financial Resource Strain: Low Risk   . Difficulty of Paying Living Expenses: Not hard at all  Food Insecurity: Food Insecurity Present  . Worried About Charity fundraiser in the Last Year: Sometimes true  .  Ran Out of Food in the Last Year: Sometimes true  Transportation Needs: No Transportation Needs  . Lack of Transportation (Medical): No  . Lack of Transportation (Non-Medical): No  Physical Activity: Inactive  . Days of Exercise per Week: 0 days  . Minutes of Exercise per Session: 0 min  Stress: No Stress Concern Present  . Feeling of Stress : Only a little  Social Connections: Socially Isolated  . Frequency of Communication with Friends and Family: More than three times a week  . Frequency of Social Gatherings with Friends and Family: Never  . Attends Religious Services: Never  . Active Member of Clubs or Organizations: No  . Attends Archivist Meetings: Never  . Marital Status: Divorced    Family History        Family History  Problem Relation Age of Onset  . Cancer Mother        colon  . Osteoporosis Mother   . Hyperlipidemia Mother   . Alzheimer's disease Father   . Thyroid disease Son   . Heart disease Brother     Patient Active Problem List   Diagnosis Date Noted  . Hyperlipidemia 08/12/2019  . Vitamin D deficiency disease 08/12/2019  . Hallucinations, visual 03/09/2019  . Morbid obesity ([place]) 04/14/2018  .  Moderate episode of recurrent major depressive disorder ([place]) 04/14/2018  . Aortic insufficiency 03/19/2018  . LVH (left ventricular hypertrophy) 03/19/2018  . Grade I diastolic dysfunction 80/16/5537  . Mitral regurgitation 03/19/2018  . Lumbar canal stenosis 09/14/2017  . Emphysema of lung ([place]) 05/31/2017  . Palpitations 05/30/2017  . Medication monitoring encounter 02/09/2016  . Essential hypertension, benign 05/17/2015  . Acute allergic reaction 05/17/2015  . Situational stress 05/17/2015  . Obesity 03/03/2015  . Abnormal thyroid blood test 03/03/2015  . Tobacco abuse 03/03/2015  . Lumbago   . Fibromyalgia   . Anxiety 10/09/2012    Past Surgical History:  Procedure Laterality Date  . ABDOMINAL HYSTERECTOMY  2000   partial, still has  cervix  . CESAREAN SECTION  1996  . LAPAROSCOPIC ABDOMINAL EXPLORATION  1999  . TONSILLECTOMY AND ADENOIDECTOMY  1962     Current Outpatient Medications:  .  albuterol (PROVENTIL HFA;VENTOLIN HFA) 108 (90 Base) MCG/ACT inhaler, INHALE 2 PUFFS BY MOUTH  EVERY 4 HOURS AS NEEDED FOR WHEEZING OR SHORTNESS OF  BREATH, Disp: 3 Inhaler, Rfl: 0 .  aspirin EC 81 MG tablet, Take 1 tablet (81 mg total) by mouth daily. Take at least one hour before naproxen, Disp: , Rfl:  .  atorvastatin (LIPITOR) 80 MG tablet, Take 1 tablet (80 mg total) by mouth at bedtime., Disp: 90 tablet, Rfl: 3 .  Cholecalciferol (VITAMIN D3) 125 MCG (5000 UT) CAPS, Take by mouth. Pt takes once a week , Disp: , Rfl:  .  DULoxetine (CYMBALTA) 60 MG capsule, Take 1 capsule (60 mg total) by mouth 2 (two) times daily., Disp: 180 capsule, Rfl: 3 .  metoprolol succinate (TOPROL-XL) 25 MG 24 hr tablet, Take 1 tablet (25 mg total) by mouth daily. for blood pressure, Disp: 90 tablet, Rfl: 3 .  pregabalin (LYRICA) 50 MG capsule, Take 1 capsule (50 mg total) by mouth 2 (two) times daily., Disp: 180 capsule, Rfl: 0 .  tiZANidine (ZANAFLEX) 4 MG tablet, Take 0.5-1.5 tablets (2-6 mg total) by mouth every 8 (eight) hours as needed for muscle spasms., Disp: 90 tablet, Rfl: 0 .  varenicline (CHANTIX STARTING MONTH PAK) 0.5 MG X 11 & 1 MG X 42 tablet, Take 0.5 mg tablet by mouth 1x daily for 3 days, then increase to 0.5 mg tablet 2x daily for 4 days, then increase to 1 mg tablet 2x daily., Disp: 53 tablet, Rfl: 0 .  [START ON 06/11/2020] varenicline (CHANTIX) 1 MG tablet, Take 1 tablet (1 mg total) by mouth 2 (two) times daily., Disp: 60 tablet, Rfl: 4  No Known Allergies  Patient Care Team: Delsa Grana, PA-C as PCP - General (Family Medicine) Dionisio David, MD as Consulting Physician (Cardiology)  Review of Systems  10 Systems reviewed and are negative for acute change except as noted in the HPI.  I personally reviewed active problem list,  medication list, allergies, family history, social history, health maintenance, notes from last encounter, lab results, imaging with the patient/caregiver today.        Objective:   Vitals:  Vitals:   05/25/20 1332  BP: 124/76  Pulse: 84  Resp: 18  Temp: 98.1 F (36.7 C)  TempSrc: Oral  SpO2: 99%  Weight: 195 lb 1.6 oz (88.5 kg)  Height: 5' 4"  (1.626 m)    Body mass index is 33.49 kg/m.  Physical Exam Vitals and nursing note reviewed.  Constitutional:      General: She is not in acute distress.  Appearance: Normal appearance. She is well-developed. She is obese. She is not ill-appearing, toxic-appearing or diaphoretic.     Interventions: Face mask in place.  HENT:     Head: Normocephalic and atraumatic.     Right Ear: External ear normal.     Left Ear: External ear normal.  Eyes:     General: Lids are normal. No scleral icterus.       Right eye: No discharge.        Left eye: No discharge.     Conjunctiva/sclera: Conjunctivae normal.  Neck:     Trachea: Phonation normal. No tracheal deviation.  Cardiovascular:     Rate and Rhythm: Normal rate and regular rhythm.     Pulses: Normal pulses.          Radial pulses are 2+ on the right side and 2+ on the left side.       Posterior tibial pulses are 2+ on the right side and 2+ on the left side.     Heart sounds: Normal heart sounds. No murmur heard.  No friction rub. No gallop.   Pulmonary:     Effort: Pulmonary effort is normal. No respiratory distress.     Breath sounds: Normal breath sounds. No stridor. No wheezing, rhonchi or rales.  Chest:     Chest wall: No tenderness.  Abdominal:     General: Bowel sounds are normal. There is no distension.     Palpations: Abdomen is soft.     Tenderness: There is no abdominal tenderness. There is no right CVA tenderness, left CVA tenderness or guarding.  Musculoskeletal:     Right lower leg: No edema.     Left lower leg: No edema.  Skin:    General: Skin is warm and  dry.     Capillary Refill: Capillary refill takes less than 2 seconds.     Coloration: Skin is not jaundiced or pale.     Findings: No rash.  Neurological:     Mental Status: She is alert. Mental status is at baseline.     Motor: No abnormal muscle tone.     Gait: Gait normal.  Psychiatric:        Mood and Affect: Mood normal.        Speech: Speech normal.        Behavior: Behavior normal.       Fall Risk: Fall Risk  05/25/2020 05/12/2020 05/11/2020 01/20/2020 11/24/2019  Falls in the past year? 0 0 0 0 0  Number falls in past yr: 0 0 0 0 0  Injury with Fall? 0 0 0 0 0  Risk for fall due to : - - No Fall Risks - -  Risk for fall due to: Comment - - - - -  Follow up Falls evaluation completed Falls evaluation completed Falls prevention discussed - -    Functional Status Survey: Is the patient deaf or have difficulty hearing?: No Does the patient have difficulty seeing, even when wearing glasses/contacts?: No Does the patient have difficulty concentrating, remembering, or making decisions?: No Does the patient have difficulty walking or climbing stairs?: Yes Does the patient have difficulty dressing or bathing?: No Does the patient have difficulty doing errands alone such as visiting a doctor's office or shopping?: Yes   Assessment & Plan:    CPE completed today  . USPSTF grade A and B recommendations reviewed with patient; age-appropriate recommendations, preventive care, screening tests, etc discussed and encouraged; healthy living encouraged; see AVS  for patient education given to patient  . Discussed importance of 150 minutes of physical activity weekly, AHA exercise recommendations given to pt in AVS/handout  . Discussed importance of healthy diet:  eating lean meats and proteins, avoiding trans fats and saturated fats, avoid simple sugars and excessive carbs in diet, eat 6 servings of fruit/vegetables daily and drink plenty of water and avoid sweet beverages.     . Recommended pt to do annual eye exam and routine dental exams/cleanings  . Depression, alcohol, fall screening completed as documented above and per flowsheets  . Reviewed Health Maintenance: Health Maintenance  Topic Date Due  . COLONOSCOPY  Never done  . MAMMOGRAM  01/22/2021  . PAP SMEAR-Modifier  04/29/2021  . TETANUS/TDAP  09/02/2025  . INFLUENZA VACCINE  Completed  . COVID-19 Vaccine  Completed  . Hepatitis C Screening  Completed  . HIV Screening  Completed    . Immunizations: Immunization History  Administered Date(s) Administered  . Influenza Inj Mdck Quad Pf 06/14/2017  . Influenza,inj,Quad PF,6+ Mos 04/04/2018, 05/12/2020  . Moderna SARS-COVID-2 Vaccination 02/25/2020, 03/24/2020  . Tdap 09/03/2015  . Zoster 08/21/2013  . Zoster Recombinat (Shingrix) 04/04/2018, 05/12/2020     ICD-10-CM   1. Annual physical exam  Z00.00 HIV antibody (with reflex)    Hemoglobin A1c    CBC w/Diff/Platelet    COMPLETE METABOLIC PANEL WITH GFR    Lipid panel    CANCELED: Lipid panel    CANCELED: CBC w/Diff/Platelet    CANCELED: COMPLETE METABOLIC PANEL WITH GFR  2. Screening for HIV without presence of risk factors  Z11.4 HIV antibody (with reflex)  3. Screening for colon cancer  Z12.11 Ambulatory referral to Gastroenterology   Mother had colon cancer, patient has never done any colon cancer screening, no current signs or symptoms of colorectal cancer, consult with GI  4. Mixed hyperlipidemia  L93.7 COMPLETE METABOLIC PANEL WITH GFR    Lipid panel    CANCELED: Lipid panel   Improving LDL with diet changes, exercise, weight loss and Lipitor  5. Prediabetes  R73.03 Hemoglobin A1c    COMPLETE METABOLIC PANEL WITH GFR   Improving A1c, last labs recently done was 5.7 we will recheck in 3 to 6 months  6. Essential hypertension, benign  T02 COMPLETE METABOLIC PANEL WITH GFR   Well-controlled with metoprolol blood pressure at goal today  7. Postmenopausal estrogen deficiency  Z78.0  DG Bone Density   Went through menopause over 20 years ago has vitamin D deficiency and multiple muscle skeletal chronic pain issues -appropriate for bone density screen  8. Class 1 obesity with serious comorbidity and body mass index (BMI) of 33.0 to 33.9 in adult, unspecified obesity type  E66.9    Z68.33    Chronic pain, osteoarthritis, hyperlipidemia, hypertension and prediabetes   Patient brings in a book from Medicare and states that she is called [employer] and Medicare and they have explained to her that she would qualify for Medicare diabetes prevention program and that PCP should call Medicare to arrange this however I did not know what program this is referring to and she was previously denied medical nutritional therapy or talking to a dietitian because she does not have diabetes though her BMI is over 30.    I have sent a message to my supervising physician to inquire if she knows of any programs which are covered under Medicare   Delsa Grana, PA-C 05/25/20 1:50 PM  [place]

## 2020-05-25 ENCOUNTER — Ambulatory Visit (INDEPENDENT_AMBULATORY_CARE_PROVIDER_SITE_OTHER): Payer: Medicare PPO | Admitting: Family Medicine

## 2020-05-25 ENCOUNTER — Encounter: Payer: Self-pay | Admitting: Family Medicine

## 2020-05-25 ENCOUNTER — Other Ambulatory Visit: Payer: Self-pay

## 2020-05-25 VITALS — BP 124/76 | HR 84 | Temp 98.1°F | Resp 18 | Ht 64.0 in | Wt 195.1 lb

## 2020-05-25 DIAGNOSIS — Z0000 Encounter for general adult medical examination without abnormal findings: Secondary | ICD-10-CM

## 2020-05-25 DIAGNOSIS — E782 Mixed hyperlipidemia: Secondary | ICD-10-CM

## 2020-05-25 DIAGNOSIS — Z1211 Encounter for screening for malignant neoplasm of colon: Secondary | ICD-10-CM | POA: Diagnosis not present

## 2020-05-25 DIAGNOSIS — R7303 Prediabetes: Secondary | ICD-10-CM

## 2020-05-25 DIAGNOSIS — Z114 Encounter for screening for human immunodeficiency virus [HIV]: Secondary | ICD-10-CM | POA: Diagnosis not present

## 2020-05-25 DIAGNOSIS — I10 Essential (primary) hypertension: Secondary | ICD-10-CM

## 2020-05-25 DIAGNOSIS — E669 Obesity, unspecified: Secondary | ICD-10-CM

## 2020-05-25 DIAGNOSIS — Z6833 Body mass index (BMI) 33.0-33.9, adult: Secondary | ICD-10-CM

## 2020-05-25 DIAGNOSIS — Z780 Asymptomatic menopausal state: Secondary | ICD-10-CM

## 2020-05-25 NOTE — Patient Instructions (Addendum)
[facility] at [facility],  [place]  [id number] Get Driving Directions Main: [phone] Call to schedule bone scan  Health Maintenance  Topic Date Due  . Colon Cancer Screening  Never done  . Mammogram  01/22/2021  . Pap Smear  04/29/2021  . Tetanus Vaccine  09/02/2025  . Flu Shot  Completed  . COVID-19 Vaccine  Completed  .  Hepatitis C: One time screening is recommended by Center for Disease Control  (CDC) for  adults born from [birth year] through 1964.   Completed  . HIV Screening  Completed    See if you can get any info about your surgery with hysterectomy and see if there was ovaries taken out or not     Preventive Care 2-39 Years Old, Female Preventive care refers to visits with your health care provider and lifestyle choices that can promote health and wellness. This includes:  A yearly physical exam. This may also be called an annual well check.  Regular dental visits and eye exams.  Immunizations.  Screening for certain conditions.  Healthy lifestyle choices, such as eating a healthy diet, getting regular exercise, not using drugs or products that contain nicotine and tobacco, and limiting alcohol use. What can I expect for my preventive care visit? Physical exam Your health care provider will check your:  Height and weight. This may be used to calculate body mass index (BMI), which tells if you are at a healthy weight.  Heart rate and blood pressure.  Skin for abnormal spots. Counseling Your health care provider may ask you questions about your:  Alcohol, tobacco, and drug use.  Emotional well-being.  Home and relationship well-being.  Sexual activity.  Eating habits.  Work and work Statistician.  Method of birth control.  Menstrual cycle.  Pregnancy history. What immunizations do I need?  Influenza (flu) vaccine  This is recommended every year. Tetanus, diphtheria, and pertussis (Tdap)  vaccine  You may need a Td booster every 10 years. Varicella (chickenpox) vaccine  You may need this if you have not been vaccinated. Zoster (shingles) vaccine  You may need this after age 39. Measles, mumps, and rubella (MMR) vaccine  You may need at least one dose of MMR if you were born in 1957 or later. You may also need a second dose. Pneumococcal conjugate (PCV13) vaccine  You may need this if you have certain conditions and were not previously vaccinated. Pneumococcal polysaccharide (PPSV23) vaccine  You may need one or two doses if you smoke cigarettes or if you have certain conditions. Meningococcal conjugate (MenACWY) vaccine  You may need this if you have certain conditions. Hepatitis A vaccine  You may need this if you have certain conditions or if you travel or work in places where you may be exposed to hepatitis A. Hepatitis B vaccine  You may need this if you have certain conditions or if you travel or work in places where you may be exposed to hepatitis B. Haemophilus influenzae type b (Hib) vaccine  You may need this if you have certain conditions. Human papillomavirus (HPV) vaccine  If recommended by your health care provider, you may need three doses over 6 months. You may receive vaccines as individual doses or as more than one vaccine together in one shot (combination vaccines). Talk with your health care provider about the risks and benefits of combination vaccines. What tests do I need? Blood tests  Lipid and cholesterol levels. These may be checked  every 5 years, or more frequently if you are over 9 years old.  Hepatitis C test.  Hepatitis B test. Screening  Lung cancer screening. You may have this screening every year starting at age 46 if you have a 30-pack-year history of smoking and currently smoke or have quit within the past 15 years.  Colorectal cancer screening. All adults should have this screening starting at age 68 and continuing until  age 105. Your health care provider may recommend screening at age 2 if you are at increased risk. You will have tests every 1-10 years, depending on your results and the type of screening test.  Diabetes screening. This is done by checking your blood sugar (glucose) after you have not eaten for a while (fasting). You may have this done every 1-3 years.  Mammogram. This may be done every 1-2 years. Talk with your health care provider about when you should start having regular mammograms. This may depend on whether you have a family history of breast cancer.  BRCA-related cancer screening. This may be done if you have a family history of breast, ovarian, tubal, or peritoneal cancers.  Pelvic exam and Pap test. This may be done every 3 years starting at age 4. Starting at age 38, this may be done every 5 years if you have a Pap test in combination with an HPV test. Other tests  Sexually transmitted disease (STD) testing.  Bone density scan. This is done to screen for osteoporosis. You may have this scan if you are at high risk for osteoporosis. Follow these instructions at home: Eating and drinking  Eat a diet that includes fresh fruits and vegetables, whole grains, lean protein, and low-fat dairy.  Take vitamin and mineral supplements as recommended by your health care provider.  Do not drink alcohol if: ? Your health care provider tells you not to drink. ? You are pregnant, may be pregnant, or are planning to become pregnant.  If you drink alcohol: ? Limit how much you have to 0-1 drink a day. ? Be aware of how much alcohol is in your drink. In the [place] one drink equals one 12 oz bottle of beer (355 mL), one 5 oz glass of wine (148 mL), or one 1 oz glass of hard liquor (44 mL). Lifestyle  Take daily care of your teeth and gums.  Stay active. Exercise for at least 30 minutes on 5 or more days each week.  Do not use any products that contain nicotine or tobacco, such as cigarettes,  e-cigarettes, and chewing tobacco. If you need help quitting, ask your health care provider.  If you are sexually active, practice safe sex. Use a condom or other form of birth control (contraception) in order to prevent pregnancy and STIs (sexually transmitted infections).  If told by your health care provider, take low-dose aspirin daily starting at age 12. What's next?  Visit your health care provider once a year for a well check visit.  Ask your health care provider how often you should have your eyes and teeth checked.  Stay up to date on all vaccines. This information is not intended to replace advice given to you by your health care provider. Make sure you discuss any questions you have with your health care provider. Document Revised: 04/18/2018 Document Reviewed: 04/18/2018 Elsevier Patient Education  2020 [employer].

## 2020-06-07 ENCOUNTER — Encounter: Payer: Self-pay | Admitting: *Deleted

## 2020-06-08 ENCOUNTER — Ambulatory Visit: Payer: Medicare PPO | Attending: Nurse Practitioner

## 2020-06-08 ENCOUNTER — Inpatient Hospital Stay: Payer: Medicare PPO | Attending: Nurse Practitioner | Admitting: Nurse Practitioner

## 2020-06-09 ENCOUNTER — Telehealth: Payer: Self-pay | Admitting: *Deleted

## 2020-06-09 NOTE — Telephone Encounter (Signed)
Patient left voicemail that she was sick and had to cancel lung screening scan. Attempted to call back to reschedule. Left voicemail for patient to return my call when she was feeling better.

## 2020-06-16 ENCOUNTER — Telehealth: Payer: Medicare PPO

## 2020-06-16 NOTE — Telephone Encounter (Signed)
Contacted and rescheduled.

## 2020-06-17 ENCOUNTER — Ambulatory Visit
Admission: RE | Admit: 2020-06-17 | Discharge: 2020-06-17 | Disposition: A | Discharge status: Home / Self Care | Payer: Medicare PPO | Source: Ambulatory Visit | Attending: Nurse Practitioner | Admitting: Nurse Practitioner

## 2020-06-17 ENCOUNTER — Inpatient Hospital Stay (HOSPITAL_BASED_OUTPATIENT_CLINIC_OR_DEPARTMENT_OTHER): Payer: Medicare PPO | Admitting: Nurse Practitioner

## 2020-06-17 ENCOUNTER — Other Ambulatory Visit: Payer: Self-pay

## 2020-06-17 DIAGNOSIS — Z87891 Personal history of nicotine dependence: Secondary | ICD-10-CM | POA: Insufficient documentation

## 2020-06-17 DIAGNOSIS — Z122 Encounter for screening for malignant neoplasm of respiratory organs: Secondary | ICD-10-CM | POA: Diagnosis present

## 2020-06-17 NOTE — Progress Notes (Signed)
Virtual Visit via Video Enabled Telemedicine Note   I connected with Jenna Meyers on 06/17/20 at 2:00 PM EST by video enabled telemedicine visit and verified that I am speaking with the correct person using two identifiers. Video failed and we converted to telephone visit.   I discussed the limitations, risks, security and privacy concerns of performing an evaluation and management service by telemedicine and the availability of in-person appointments. I also discussed with the patient that there may be a patient responsible charge related to this service. The patient expressed understanding and agreed to proceed.   Other persons participating in the visit and their role in the encounter: Burgess Estelle, RN- checking in patient & navigation  Patient's location: [place]  Provider's location: Clinic  Chief Complaint: Low Dose CT Screening  Patient agreed to evaluation by telemedicine to discuss shared decision making for consideration of low dose CT lung cancer screening.    In accordance with CMS guidelines, patient has met eligibility criteria including age, absence of signs or symptoms of lung cancer.  Social History   Tobacco Use  . Smoking status: Current Every Day Smoker    Packs/day: 1.50    Years: 34.00    Pack years: 51.00    Types: Cigarettes  . Smokeless tobacco: Never Used  . Tobacco comment: been given a Rx for Chantix quit for 1 yr and started back  Substance Use Topics  . Alcohol use: Yes    Comment: rare     A shared decision-making session was conducted prior to the performance of CT scan. This includes one or more decision aids, includes benefits and harms of screening, follow-up diagnostic testing, over-diagnosis, false positive rate, and total radiation exposure.   Counseling on the importance of adherence to annual lung cancer LDCT screening, impact of co-morbidities, and ability or willingness to undergo diagnosis and treatment is imperative for  compliance of the program.   Counseling on the importance of continued smoking cessation for former smokers; the importance of smoking cessation for current smokers, and information about tobacco cessation interventions have been given to patient including [place] and [employer] [place] programs.   Written order for lung cancer screening with LDCT has been given to the patient and any and all questions have been answered to the best of my abilities.    Yearly follow up will be coordinated by Burgess Estelle, Thoracic Navigator.  I discussed the assessment and treatment plan with the patient. The patient was provided an opportunity to ask questions and all were answered. The patient agreed with the plan and demonstrated an understanding of the instructions.   The patient was advised to call back or seek an in-person evaluation if the symptoms worsen or if the condition fails to improve as anticipated.   I provided 15 minutes of face-to-face video visit time during this encounter, and > 50% was spent counseling as documented under my assessment & plan.   Beckey Rutter, DNP, AGNP-C [place] at [facility] [phone] (clinic)

## 2020-06-20 ENCOUNTER — Encounter: Payer: Self-pay | Admitting: *Deleted

## 2020-06-29 ENCOUNTER — Other Ambulatory Visit: Payer: Self-pay | Admitting: Family Medicine

## 2020-06-29 DIAGNOSIS — M797 Fibromyalgia: Secondary | ICD-10-CM

## 2020-06-29 DIAGNOSIS — M25559 Pain in unspecified hip: Secondary | ICD-10-CM

## 2020-06-29 DIAGNOSIS — M48061 Spinal stenosis, lumbar region without neurogenic claudication: Secondary | ICD-10-CM

## 2020-06-29 DIAGNOSIS — G8929 Other chronic pain: Secondary | ICD-10-CM

## 2020-06-29 DIAGNOSIS — M545 Low back pain, unspecified: Secondary | ICD-10-CM

## 2020-06-29 DIAGNOSIS — I10 Essential (primary) hypertension: Secondary | ICD-10-CM

## 2020-07-01 ENCOUNTER — Telehealth: Payer: Self-pay | Admitting: Family Medicine

## 2020-07-01 DIAGNOSIS — F419 Anxiety disorder, unspecified: Secondary | ICD-10-CM

## 2020-07-01 DIAGNOSIS — M797 Fibromyalgia: Secondary | ICD-10-CM

## 2020-07-01 DIAGNOSIS — G8929 Other chronic pain: Secondary | ICD-10-CM

## 2020-07-01 DIAGNOSIS — M545 Low back pain, unspecified: Secondary | ICD-10-CM

## 2020-07-01 DIAGNOSIS — F331 Major depressive disorder, recurrent, moderate: Secondary | ICD-10-CM

## 2020-07-01 NOTE — Telephone Encounter (Signed)
Medication Refill - Medication: Lyrica 50mg  and Cymbalta 60mg   Has the patient contacted their pharmacy? Yes.   (Agent: If no, request that the patient contact the pharmacy for the refill.) (Agent: If yes, when and what did the pharmacy advise?)  Preferred Pharmacy (with phone number or street name):[id number] , - [address]   Agent: Please be advised that RX refills may take up to 3 business days. We ask that you follow-up with your pharmacy.

## 2020-07-02 MED ORDER — DULOXETINE HCL 60 MG PO CPEP: 60.0000 mg | ORAL_CAPSULE | Freq: Two times a day (BID) | ORAL | 0 refills | Status: DC

## 2020-07-02 MED ORDER — PREGABALIN 50 MG PO CAPS: 50.0000 mg | ORAL_CAPSULE | Freq: Two times a day (BID) | ORAL | 0 refills | Status: DC

## 2020-07-02 NOTE — Telephone Encounter (Signed)
Would like a 30 day supply of Cymbalta sent to local pharmacy until mail order comes. [facility] [address]. Do not need Lyrica

## 2020-07-05 ENCOUNTER — Other Ambulatory Visit: Payer: Self-pay

## 2020-07-05 DIAGNOSIS — M797 Fibromyalgia: Secondary | ICD-10-CM

## 2020-07-05 DIAGNOSIS — M545 Low back pain, unspecified: Secondary | ICD-10-CM

## 2020-07-05 DIAGNOSIS — G8929 Other chronic pain: Secondary | ICD-10-CM

## 2020-07-25 ENCOUNTER — Other Ambulatory Visit: Payer: Self-pay | Admitting: Family Medicine

## 2020-07-25 DIAGNOSIS — F419 Anxiety disorder, unspecified: Secondary | ICD-10-CM

## 2020-07-25 DIAGNOSIS — F331 Major depressive disorder, recurrent, moderate: Secondary | ICD-10-CM

## 2020-07-25 DIAGNOSIS — M797 Fibromyalgia: Secondary | ICD-10-CM

## 2020-08-17 IMAGING — US US BREAST*R* LIMITED INC AXILLA
1 series · 5 of 5 positions shown · non-contrast
Comparison: Recent screening mammogram dated 03/14/2018.

CLINICAL DATA: Patient returns today to evaluate a possible RIGHT
breast asymmetry identified on recent screening mammogram.

EXAM:
DIGITAL DIAGNOSTIC RIGHT MAMMOGRAM WITH CAD AND TOMO
ULTRASOUND RIGHT BREAST

[Series 1: us breast*right* limited inc axilla · 0.06mm/px · 5 of 5 slices shown]
[im 1/5]
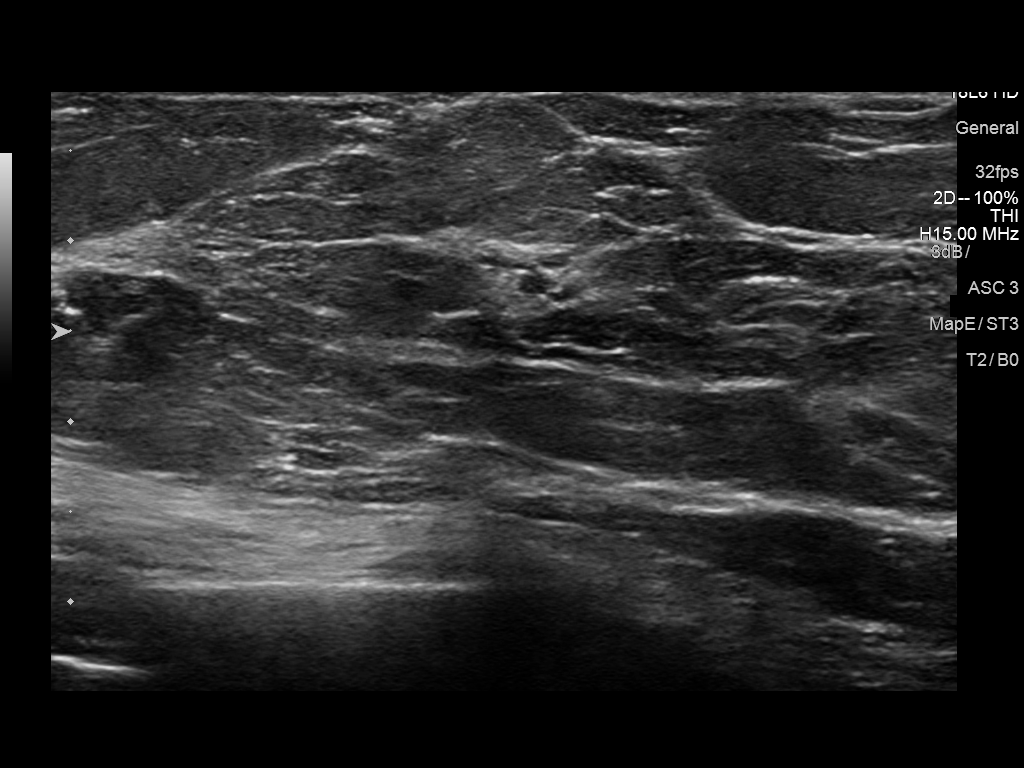
[im 2/5]
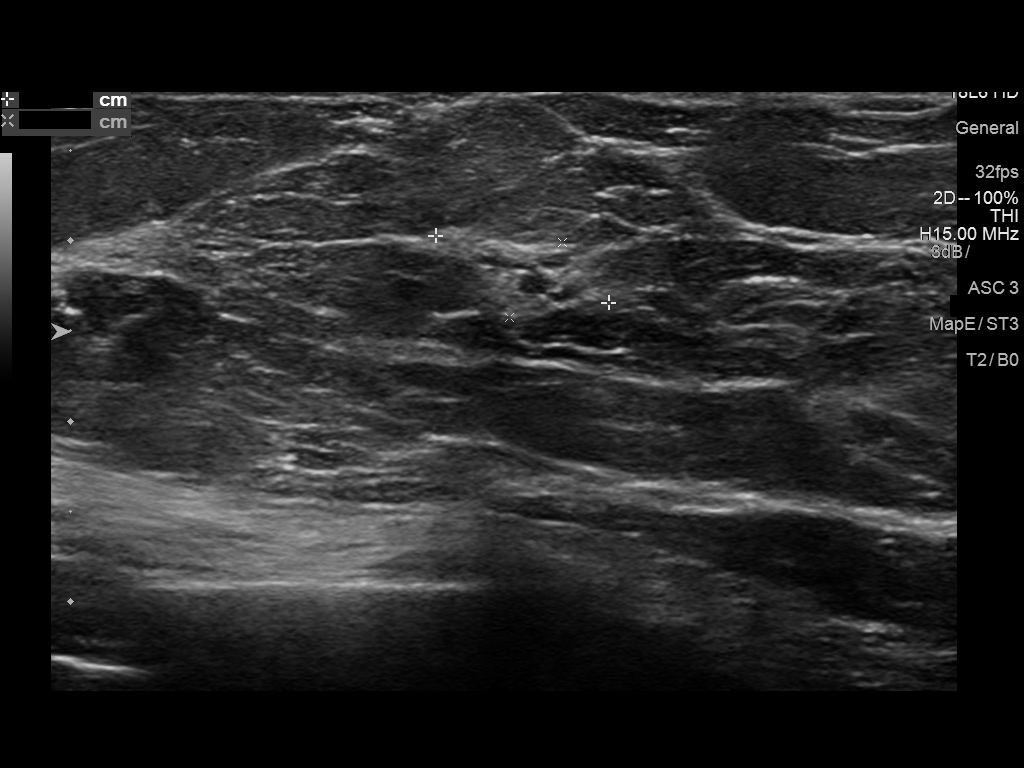
[im 3/5]
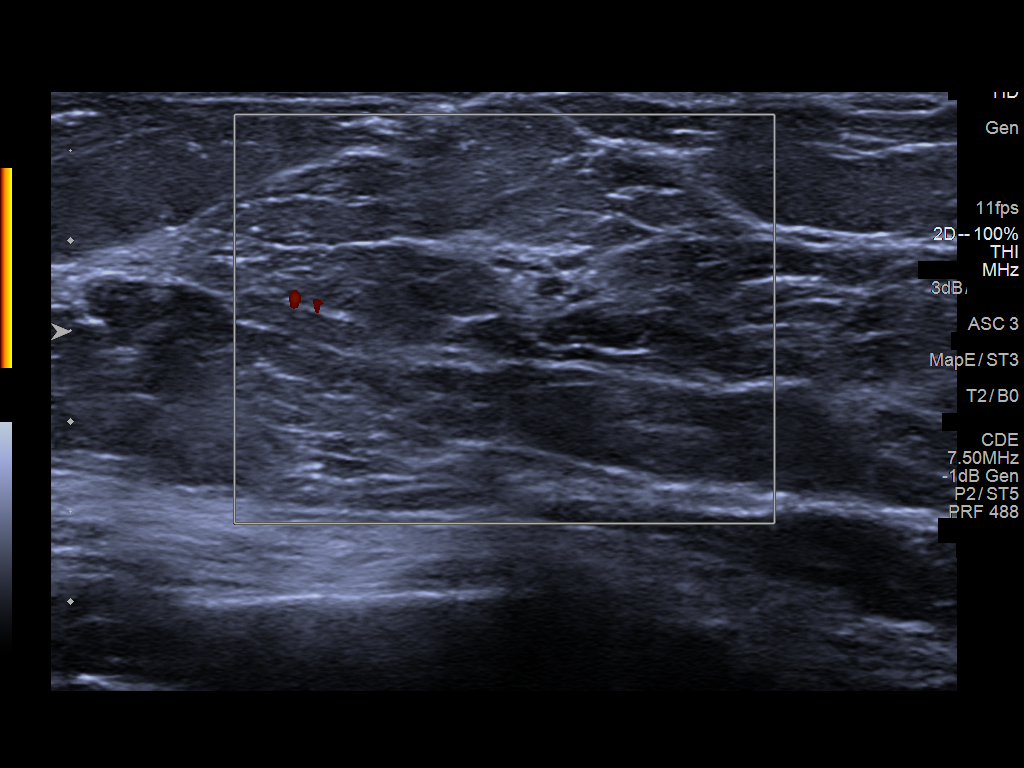
[im 4/5]
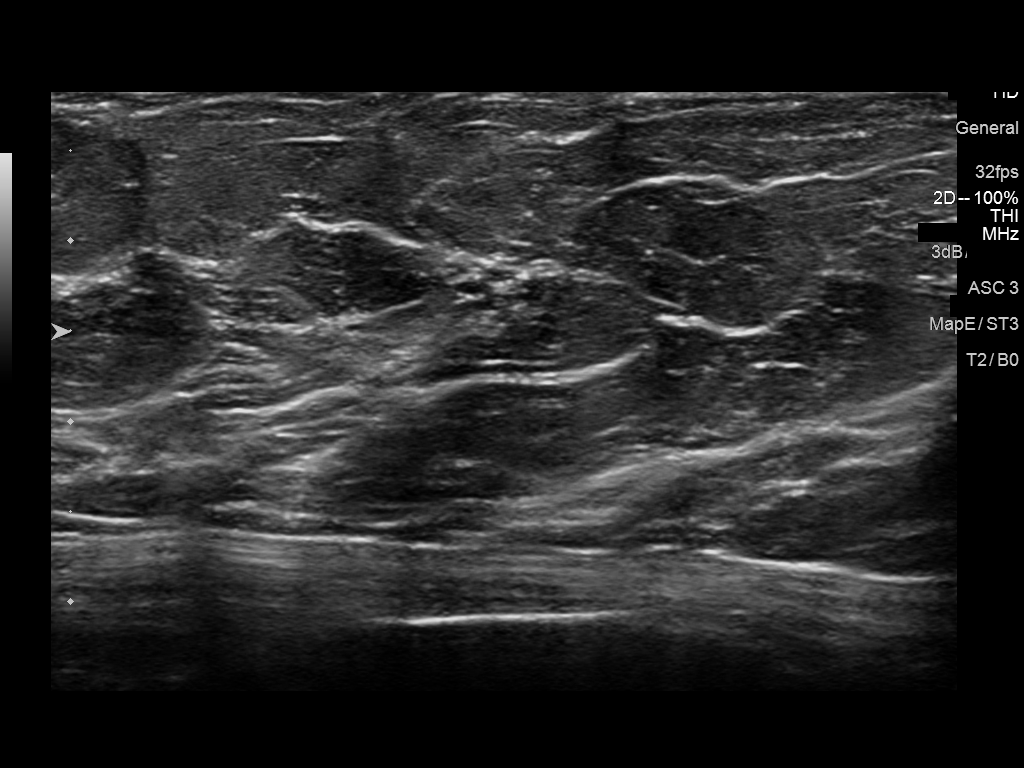
[im 5/5]
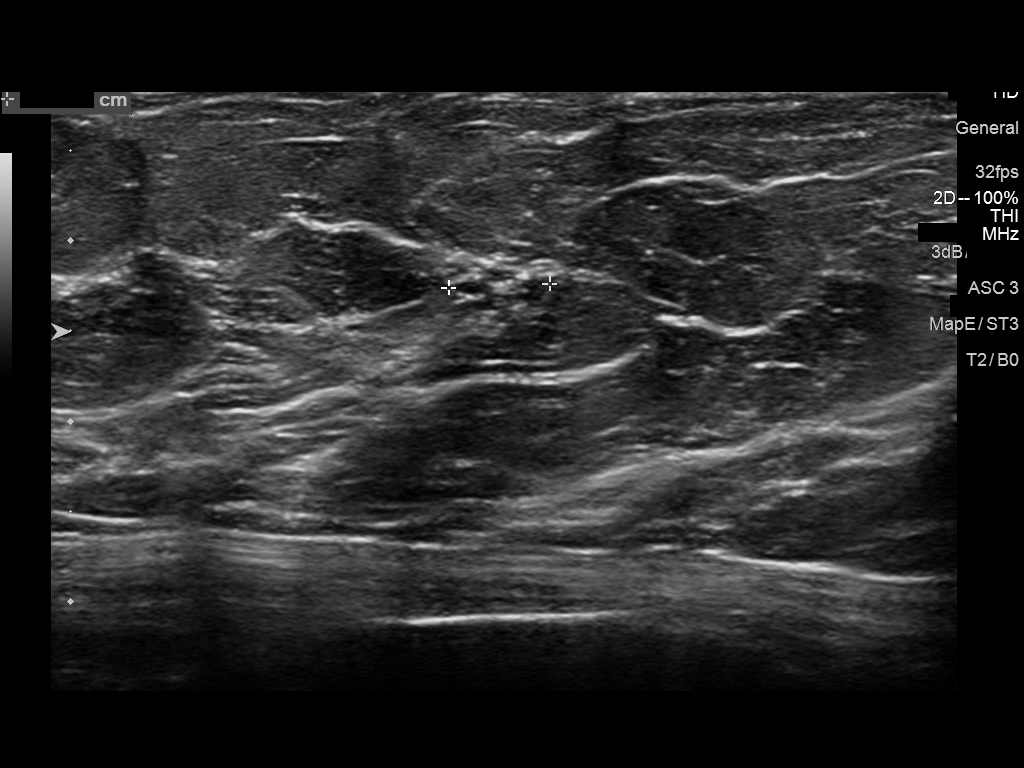

[5 of 5 positions shown; findings below may reference images not displayed]

There are
no earlier mammograms.

ACR Breast Density Category b: There are scattered areas of
fibroglandular density.
FINDINGS: On today's additional diagnostic views, including spot compression
with 3D tomosynthesis, the questioned asymmetry within the lower
outer quadrant of the RIGHT breast is most suggestive of an island
of normal fibroglandular tissue. There are no dominant masses,
suspicious calcifications or secondary signs of malignancy
identified within the RIGHT breast on today's exam.

Mammographic images were processed with CAD.

Targeted ultrasound is performed, showing a small mixed echogenicity
area in the RIGHT breast at the 8 o'clock axis, 3 cm from the
nipple, measuring 1 x 0.5 x 0.6 cm, most suggestive of an island
normal fibroglandular tissue and/or fibrocystic change, probable
correlate for the mammographic asymmetry. No suspicious solid or
cystic mass is identified within the lower outer quadrant of the
RIGHT breast by ultrasound.
IMPRESSION: Probably benign asymmetry within the lower outer quadrant of the
RIGHT breast, most suggestive of an island of normal fibroglandular
tissue and/or fibrocystic change on today's diagnostic exam.
Recommend follow-up RIGHT breast diagnostic mammogram and ultrasound
in 6 months to ensure stability.

RECOMMENDATION:
RIGHT breast diagnostic mammogram and ultrasound in 6 months.

I have discussed the findings and recommendations with the patient.
Results were also provided in writing at the conclusion of the
visit. If applicable, a reminder letter will be sent to the patient
regarding the next appointment.

BI-RADS CATEGORY  3: Probably benign.

## 2020-09-07 ENCOUNTER — Ambulatory Visit: Payer: Self-pay

## 2020-09-07 NOTE — Telephone Encounter (Signed)
3rd attempt to contact patient. No answer and message received that call can not completed at this time, please try again later. Patient calling for advise of OTC medications due to covid symptoms.

## 2020-09-07 NOTE — Telephone Encounter (Addendum)
Attempted to contact patient. Unable to leave message. Message states plese try again later.   Attempted to contact patient. Unable to leave VM. Message states please try your call again later.

## 2020-09-07 NOTE — Telephone Encounter (Signed)
See previous note

## 2020-09-08 ENCOUNTER — Other Ambulatory Visit: Payer: Medicare PPO

## 2020-11-09 ENCOUNTER — Ambulatory Visit: Payer: Medicare PPO | Admitting: Family Medicine

## 2020-12-20 ENCOUNTER — Ambulatory Visit: Payer: Medicare PPO | Admitting: Family Medicine

## 2021-01-05 ENCOUNTER — Ambulatory Visit: Payer: Medicare PPO | Admitting: Family Medicine

## 2021-01-18 ENCOUNTER — Other Ambulatory Visit: Payer: Self-pay | Admitting: Family Medicine

## 2021-01-18 ENCOUNTER — Ambulatory Visit: Payer: Self-pay | Admitting: *Deleted

## 2021-01-18 DIAGNOSIS — I10 Essential (primary) hypertension: Secondary | ICD-10-CM

## 2021-01-18 MED ORDER — METOPROLOL SUCCINATE ER 25 MG PO TB24: ORAL_TABLET | ORAL | 0 refills | Status: DC

## 2021-01-18 NOTE — Telephone Encounter (Signed)
Patient c/o chest heaviness since this weekend. Approx. 2-3 days ago . Chest heaviness comes and goes and noted on left upper chest area above breast. Has felt a "little foggy". Noted elevated B/P now 156/90. Patient did not report how long she has been without metoprolol XL 25 mg . Patient reports she has wanted to decrease the amount of medications she takes and just did not have it refilled. Reports she has been eating more bananas and drinking pomegranate juice and that has helped with her chest heaviness. Denies difficulty breathing, N/V, dizziness, or severe chest pain. Denies heart palpitations or racing heartbeat. appt scheduled 01/20/21 with different provider. Patient requesting to have metoprolol refilled [facility] [place]. Care advise given. Patient reports she does not feel she needs UC at this time. Patient verbalized understanding of care advise and to call back or go to [facility] or ED if symptoms worsen.   Reason for Disposition . [1] Chest pain lasts > 5 minutes AND [2] occurred > 3 days ago (72 hours) AND [3] NO chest pain or cardiac symptoms now  Answer Assessment - Initial Assessment Questions 1. LOCATION: "Where does it hurt?"       Upper chest left side above breast  2. RADIATION: "Does the pain go anywhere else?" (e.g., into neck, jaw, arms, back)     No  3. ONSET: "When did the chest pain begin?" (Minutes, hours or days)      This past weekend  4. PATTERN "Does the pain come and go, or has it been constant since it started?"  "Does it get worse with exertion?"      Comes and goes 5. DURATION: "How long does it last" (e.g., seconds, minutes, hours)     At least 5 minutes sometimes longer  6. SEVERITY: "How bad is the pain?"  (e.g., Scale 1-10; mild, moderate, or severe)    - MILD (1-3): doesn't interfere with normal activities     - MODERATE (4-7): interferes with normal activities or awakens from sleep    - SEVERE (8-10): excruciating pain,  unable to do any normal activities       mild 7. CARDIAC RISK FACTORS: "Do you have any history of heart problems or risk factors for heart disease?" (e.g., angina, prior heart attack; diabetes, high blood pressure, high cholesterol, smoker, or strong family history of heart disease)     HTN 8. PULMONARY RISK FACTORS: "Do you have any history of lung disease?"  (e.g., blood clots in lung, asthma, emphysema, birth control pills)     na 9. CAUSE: "What do you think is causing the chest pain?"     Stopped taking metoprolol  10. OTHER SYMPTOMS: "Do you have any other symptoms?" (e.g., dizziness, nausea, vomiting, sweating, fever, difficulty breathing, cough)       Elevated B/P , chest heaviness, "foggy"  11. PREGNANCY: "Is there any chance you are pregnant?" "When was your last menstrual period?"       na  Protocols used: CHEST PAIN-A-AH

## 2021-01-18 NOTE — Telephone Encounter (Signed)
Courtesy refill. Future visit in 2 days

## 2021-01-19 NOTE — Telephone Encounter (Signed)
Spoke w/ pt yesterday refill sent

## 2021-01-20 ENCOUNTER — Ambulatory Visit: Payer: Medicare PPO | Admitting: Unknown Physician Specialty

## 2021-01-31 ENCOUNTER — Encounter: Payer: Self-pay | Admitting: Family Medicine

## 2021-01-31 ENCOUNTER — Ambulatory Visit: Payer: Medicare PPO | Admitting: Family Medicine

## 2021-01-31 ENCOUNTER — Ambulatory Visit: Payer: Self-pay

## 2021-01-31 ENCOUNTER — Other Ambulatory Visit: Payer: Self-pay

## 2021-01-31 VITALS — BP 122/66 | HR 81 | Temp 98.1°F | Resp 16 | Ht 64.0 in | Wt 183.5 lb

## 2021-01-31 DIAGNOSIS — Z1231 Encounter for screening mammogram for malignant neoplasm of breast: Secondary | ICD-10-CM

## 2021-01-31 DIAGNOSIS — I10 Essential (primary) hypertension: Secondary | ICD-10-CM

## 2021-01-31 DIAGNOSIS — K625 Hemorrhage of anus and rectum: Secondary | ICD-10-CM

## 2021-01-31 DIAGNOSIS — F331 Major depressive disorder, recurrent, moderate: Secondary | ICD-10-CM | POA: Diagnosis not present

## 2021-01-31 DIAGNOSIS — E782 Mixed hyperlipidemia: Secondary | ICD-10-CM

## 2021-01-31 DIAGNOSIS — F419 Anxiety disorder, unspecified: Secondary | ICD-10-CM | POA: Diagnosis not present

## 2021-01-31 DIAGNOSIS — F439 Reaction to severe stress, unspecified: Secondary | ICD-10-CM

## 2021-01-31 MED ORDER — ESCITALOPRAM OXALATE 10 MG PO TABS: 10.0000 mg | ORAL_TABLET | Freq: Every day | ORAL | 1 refills | Status: DC

## 2021-01-31 MED ORDER — ATORVASTATIN CALCIUM 80 MG PO TABS: 80.0000 mg | ORAL_TABLET | Freq: Every day | ORAL | 3 refills | Status: DC

## 2021-01-31 NOTE — Telephone Encounter (Signed)
Pt. Reports she had a large amount of bright red blood this morning with bowel movement. Mainly in the water and when she wiped. No pain. Concerned because her mother had colon cancer. Pt. Has an appointment today.

## 2021-01-31 NOTE — Progress Notes (Signed)
Name: Jenna Meyers   MRN: [id number]    DOB: [birth year]   Date:01/31/2021       Progress Note  Chief Complaint  Patient presents with   Hyperlipidemia   Anxiety      Subjective:   Jenna Meyers is a 65 y.o. female, presents to clinic for f/up, she is anxious and upset today, had a panic attack prior to coming and she also had some blood in stool  GAD 7 : Generalized Anxiety Score 01/31/2021 01/09/2019  Nervous, Anxious, on Edge 2 1  Control/stop worrying 3 1  Worry too much - different things 3 1  Trouble relaxing 2 0  Restless 1 0  Easily annoyed or irritable 3 0  Afraid - awful might happen 2 0  Total GAD 7 Score 16 3  Anxiety Difficulty Somewhat difficult Not difficult at all   Depression screen [facility] 2/9 01/31/2021 05/25/2020 05/11/2020  Decreased Interest 1 1 0  Down, Depressed, Hopeless 3 1 0  PHQ - 2 Score 4 2 0  Altered sleeping 2 1 -  Tired, decreased energy 1 1 -  Change in appetite 1 1 -  Feeling bad or failure about yourself  3 0 -  Trouble concentrating 1 0 -  Moving slowly or fidgety/restless 2 0 -  Suicidal thoughts 0 0 -  PHQ-9 Score 14 - -  Difficult doing work/chores Somewhat difficult Somewhat difficult -  Some recent data might be hidden   Patient endorses significant daily stressors in her home, she is the only person working and providing income, her son lives with her and so does his partner and their 2 children neither of the adults are working and she does help take care of the children she also has the only 1 helping clean up the home and additionally there are 3 other animals in a small 2 bedroom residence.  Her son has legal problems and has been unable to get a job her daughter-in-law has emotional mental issues and never had a job before.  She usually internalizes her frustrations and deals with it but it has recently worsened and she feels like she is going to explode  Prior fibromyalgia and chronic pain was managed on tizanidine, Lyrica and  Cymbalta but she has stopped the Lyrica and Cymbalta over the last 3 months, she does not believe that stopping these medicines had anything to do with worsening mood and anxiety, she feels that her chronic pain is more in her joints and is likely arthritis and not in her muscles or fibromyalgia  HTN -with weight loss and diet lifestyle efforts over the last couple years she did discontinue her blood pressure medicine however recently with increased stress and anxiety she restarted metoprolol BP Readings from Last 3 Encounters:  01/31/21 122/66  05/25/20 124/76  05/12/20 128/84   Hyperlipidemia: Currently treated with atorvastatin 80 mg, pt reports good med compliance Last Lipids: Lab Results  Component Value Date   CHOL 174 11/24/2019   HDL 52 11/24/2019   LDLCALC 101 (H) 11/24/2019   TRIG 117 11/24/2019   CHOLHDL 3.3 11/24/2019   - Denies: Chest pain, shortness of breath, myalgias, claudication       Current Outpatient Medications:    albuterol (PROVENTIL HFA;VENTOLIN HFA) 108 (90 Base) MCG/ACT inhaler, INHALE 2 PUFFS BY MOUTH  EVERY 4 HOURS AS NEEDED FOR WHEEZING OR SHORTNESS OF  BREATH, Disp: 3 Inhaler, Rfl: 0   atorvastatin (LIPITOR) 80 MG tablet, Take 1 tablet (80 mg  total) by mouth at bedtime., Disp: 90 tablet, Rfl: 3   Cholecalciferol (VITAMIN D3) 125 MCG (5000 UT) CAPS, Take by mouth. Pt takes once a week , Disp: , Rfl:    metoprolol succinate (TOPROL-XL) 25 MG 24 hr tablet, TAKE 1 TABLET BY MOUTH EVERY DAY FOR BLOOD PRESSURE, Disp: 90 tablet, Rfl: 0   tiZANidine (ZANAFLEX) 4 MG tablet, Take 0.5-1.5 tablets (2-6 mg total) by mouth every 8 (eight) hours as needed for muscle spasms., Disp: 90 tablet, Rfl: 0  Patient Active Problem List   Diagnosis Date Noted   Hyperlipidemia 08/12/2019   Vitamin D deficiency disease 08/12/2019   Hallucinations, visual 03/09/2019   Morbid obesity (HCC) 04/14/2018   Moderate episode of recurrent major depressive disorder (HCC) 04/14/2018    Aortic insufficiency 03/19/2018   LVH (left ventricular hypertrophy) 03/19/2018   Grade I diastolic dysfunction 03/19/2018   Mitral regurgitation 03/19/2018   Lumbar canal stenosis 09/14/2017   Emphysema of lung (HCC) 05/31/2017   Palpitations 05/30/2017   Medication monitoring encounter 02/09/2016   Essential hypertension, benign 05/17/2015   Acute allergic reaction 05/17/2015   Situational stress 05/17/2015   Obesity 03/03/2015   Abnormal thyroid blood test 03/03/2015   Tobacco abuse 03/03/2015   Lumbago    Fibromyalgia    Anxiety 10/09/2012    Past Surgical History:  Procedure Laterality Date   ABDOMINAL HYSTERECTOMY  2000   partial, still has cervix   CESAREAN SECTION  1996   LAPAROSCOPIC ABDOMINAL EXPLORATION  1999   TONSILLECTOMY AND ADENOIDECTOMY  1962    Family History  Problem Relation Age of Onset   Cancer Mother        colon   Osteoporosis Mother    Hyperlipidemia Mother    Alzheimer's disease Father    Thyroid disease Son    Heart disease Brother     Social History   Tobacco Use   Smoking status: Every Day    Packs/day: 1.50    Years: 34.00    Pack years: 51.00    Types: Cigarettes   Smokeless tobacco: Never   Tobacco comments:    been given a Rx for Chantix quit for 1 yr and started back  Vaping Use   Vaping Use: Never used  Substance Use Topics   Alcohol use: Yes    Comment: rare   Drug use: No     No Known Allergies  Health Maintenance  Topic Date Due   Pneumococcal Vaccine 420-64 Years old (1 - PCV) Never done   COLONOSCOPY (Pts 45-5650yrs Insurance coverage will need to be confirmed)  Never done   MAMMOGRAM  01/22/2021   COVID-19 Vaccine (4 - Booster for Moderna series) 03/04/2021   INFLUENZA VACCINE  03/21/2021   PAP SMEAR-Modifier  04/29/2021   TETANUS/TDAP  09/02/2025   Hepatitis C Screening  Completed   HIV Screening  Completed   Zoster Vaccines- Shingrix  Completed   HPV VACCINES  Aged Out    Chart Review Today: I  personally reviewed active problem list, medication list, allergies, family history, social history, health maintenance, notes from last encounter, lab results, imaging with the patient/caregiver today.   Review of Systems  Constitutional: Negative.   HENT: Negative.    Eyes: Negative.   Respiratory: Negative.    Cardiovascular: Negative.   Gastrointestinal: Negative.   Endocrine: Negative.   Genitourinary: Negative.   Musculoskeletal: Negative.   Skin: Negative.   Allergic/Immunologic: Negative.   Neurological: Negative.   Hematological: Negative.   Psychiatric/Behavioral:  Negative.    All other systems reviewed and are negative.   Objective:   Vitals:   01/31/21 1409  BP: 122/66  Pulse: 81  Resp: 16  Temp: 98.1 F (36.7 C)  SpO2: 97%  Weight: 183 lb 8 oz (83.2 kg)  Height: 5\' 4"  (1.626 m)    Body mass index is 31.5 kg/m.  Physical Exam Vitals and nursing note reviewed.  Constitutional:      General: She is not in acute distress.    Appearance: Normal appearance. She is well-developed, well-groomed and overweight. She is not ill-appearing, toxic-appearing or diaphoretic.     Interventions: Face mask in place.  HENT:     Head: Normocephalic and atraumatic.     Right Ear: External ear normal.     Left Ear: External ear normal.  Eyes:     General: Lids are normal. No scleral icterus.       Right eye: No discharge.        Left eye: No discharge.     Conjunctiva/sclera: Conjunctivae normal.  Neck:     Trachea: Phonation normal. No tracheal deviation.  Cardiovascular:     Rate and Rhythm: Normal rate and regular rhythm.     Pulses: Normal pulses.          Radial pulses are 2+ on the right side and 2+ on the left side.       Posterior tibial pulses are 2+ on the right side and 2+ on the left side.     Heart sounds: Normal heart sounds. No murmur heard.   No friction rub. No gallop.  Pulmonary:     Effort: Pulmonary effort is normal. No respiratory distress.      Breath sounds: Normal breath sounds. No stridor. No wheezing, rhonchi or rales.  Chest:     Chest wall: No tenderness.  Abdominal:     General: Bowel sounds are normal. There is no distension.     Palpations: Abdomen is soft.     Tenderness: There is abdominal tenderness in the epigastric area. There is no right CVA tenderness, left CVA tenderness, guarding or rebound.  Musculoskeletal:     Right lower leg: No edema.     Left lower leg: No edema.  Skin:    General: Skin is warm and dry.     Coloration: Skin is not jaundiced or pale.     Findings: No rash.  Neurological:     Mental Status: She is alert.     Motor: No abnormal muscle tone.     Gait: Gait normal.  Psychiatric:        Mood and Affect: Mood is anxious. Affect is tearful.        Speech: Speech normal.        Behavior: Behavior normal. Behavior is cooperative.        Thought Content: Thought content normal. Thought content does not include homicidal or suicidal ideation. Thought content does not include homicidal or suicidal plan.        Cognition and Memory: Cognition normal.     Depression screen [facility] 2/9 01/31/2021 05/25/2020 05/11/2020  Decreased Interest 1 1 0  Down, Depressed, Hopeless 3 1 0  PHQ - 2 Score 4 2 0  Altered sleeping 2 1 -  Tired, decreased energy 1 1 -  Change in appetite 1 1 -  Feeling bad or failure about yourself  3 0 -  Trouble concentrating 1 0 -  Moving slowly or fidgety/restless 2  0 -  Suicidal thoughts 0 0 -  PHQ-9 Score 14 - -  Difficult doing work/chores Somewhat difficult Somewhat difficult -  Some recent data might be hidden      Assessment & Plan:     ICD-10-CM   1. Anxiety disorder, unspecified type  F41.9    severe sx with increased stressors in home, trial of lexapro, close f/up, referred to SW for local resources and support    2. Moderate episode of recurrent major depressive disorder (HCC)  F33.1    start lexapro, follow closely, no SI/HI/AVH, may need psychiatry  referral if not improving     3. Mixed hyperlipidemia  E78.2 atorvastatin (LIPITOR) 80 MG tablet   good statin compliance, healthy diet and she has lost weight and maintained    4. Essential hypertension, benign  I10    BP well controlled and at goal today    5. Situational stress  F43.9 AMB Referral to Community Care Coordinaton    escitalopram (LEXAPRO) 10 MG tablet   multiple stressors in home, family, financial    6. BRBPR (bright red blood per rectum)  K62.5    one episode today with wiping, no abd pain, normal BM's check CBC and stool cards    7. Encounter for screening mammogram for malignant neoplasm of breast  Z12.31 MM 3D SCREEN BREAST BILATERAL     Stool cards x3 given to pt to return at end of week - if positive we will repeat CBC and refer to GI Pt is overdue for colonoscopy with GI and we will put in referral again for that (done recently with CPE oct 2021 but pt did not do)  Pt was well appearing, hemodynamically stable, no pallor, I suspect hemorrhoid as etiology of BRBPR Pt was encouraged to go to the ER if she has repeated blood with BM's, blood in stool, abd pain, near syncope develop  Return for 6 week f/up on anxiety/depressive sx and med check.   Danelle Berry, PA-C 01/31/21 2:39 PM

## 2021-01-31 NOTE — Telephone Encounter (Signed)
Answer Assessment - Initial Assessment Questions 1. APPEARANCE of BLOOD: "What color is it?" "Is it passed separately, on the surface of the stool, or mixed in with the stool?"      Bright red 2. AMOUNT: "How much blood was passed?"      Large 3. FREQUENCY: "How many times has blood been passed with the stools?"      1 4. ONSET: "When was the blood first seen in the stools?" (Days or weeks)      Today 5. DIARRHEA: "Is there also some diarrhea?" If Yes, ask: "How many diarrhea stools in the past 24 hours?"      No 6. CONSTIPATION: "Do you have constipation?" If Yes, ask: "How bad is it?"     No 7. RECURRENT SYMPTOMS: "Have you had blood in your stools before?" If Yes, ask: "When was the last time?" and "What happened that time?"      No 8. BLOOD THINNERS: "Do you take any blood thinners?" (e.g., Coumadin/warfarin, Pradaxa/dabigatran, aspirin)     No 9. OTHER SYMPTOMS: "Do you have any other symptoms?"  (e.g., abdomen pain, vomiting, dizziness, fever)     No 10. PREGNANCY: "Is there any chance you are pregnant?" "When was your last menstrual period?"       No  Protocols used: Rectal Bleeding-A-AH

## 2021-02-01 ENCOUNTER — Telehealth: Payer: Self-pay | Admitting: *Deleted

## 2021-02-01 LAB — CBC WITH DIFFERENTIAL/PLATELET
Absolute Monocytes: 502 cells/uL (ref 200–950)
Basophils Absolute: 60 cells/uL (ref 0–200)
Basophils Relative: 0.7 %
Eosinophils Absolute: 77 cells/uL (ref 15–500)
Eosinophils Relative: 0.9 %
HCT: 40.4 % (ref 35.0–45.0)
Hemoglobin: 13.3 g/dL (ref 11.7–15.5)
Lymphs Abs: 2559 cells/uL (ref 850–3900)
MCH: 29.8 pg (ref 27.0–33.0)
MCHC: 32.9 g/dL (ref 32.0–36.0)
MCV: 90.6 fL (ref 80.0–100.0)
MPV: 10.7 fL (ref 7.5–12.5)
Monocytes Relative: 5.9 %
Neutro Abs: 5304 cells/uL (ref 1500–7800)
Neutrophils Relative %: 62.4 %
Platelets: 349 10*3/uL (ref 140–400)
RBC: 4.46 10*6/uL (ref 3.80–5.10)
RDW: 12.8 % (ref 11.0–15.0)
Total Lymphocyte: 30.1 %
WBC: 8.5 10*3/uL (ref 3.8–10.8)

## 2021-02-01 LAB — COMPLETE METABOLIC PANEL WITH GFR
AG Ratio: 1.7 (calc) (ref 1.0–2.5)
ALT: 13 U/L (ref 6–29)
AST: 15 U/L (ref 10–35)
Albumin: 4.6 g/dL (ref 3.6–5.1)
Alkaline phosphatase (APISO): 70 U/L (ref 37–153)
BUN: 16 mg/dL (ref 7–25)
CO2: 21 mmol/L (ref 20–32)
Calcium: 9.7 mg/dL (ref 8.6–10.4)
Chloride: 107 mmol/L (ref 98–110)
Creat: 0.82 mg/dL (ref 0.50–0.99)
GFR, Est African American: 88 mL/min/{1.73_m2} (ref >=60)
GFR, Est Non African American: 76 mL/min/{1.73_m2} (ref >=60)
Globulin: 2.7 g/dL (calc) (ref 1.9–3.7)
Glucose, Bld: 80 mg/dL (ref 65–99)
Potassium: 4.4 mmol/L (ref 3.5–5.3)
Sodium: 140 mmol/L (ref 135–146)
Total Bilirubin: 0.4 mg/dL (ref 0.2–1.2)
Total Protein: 7.3 g/dL (ref 6.1–8.1)

## 2021-02-01 LAB — LIPID PANEL
Cholesterol: 215 mg/dL — ABNORMAL HIGH (ref <200)
HDL: 50 mg/dL (ref >=50)
LDL Cholesterol (Calc): 136 mg/dL (calc) — ABNORMAL HIGH
Non-HDL Cholesterol (Calc): 165 mg/dL (calc) — ABNORMAL HIGH (ref <130)
Total CHOL/HDL Ratio: 4.3 (calc) (ref <5.0)
Triglycerides: 155 mg/dL — ABNORMAL HIGH (ref <150)

## 2021-02-01 LAB — HEMOGLOBIN A1C
Hgb A1c MFr Bld: 5.5 % of total Hgb (ref <5.7)
Mean Plasma Glucose: 111 mg/dL
eAG (mmol/L): 6.2 mmol/L

## 2021-02-01 LAB — HIV ANTIBODY (ROUTINE TESTING W REFLEX): HIV 1&2 Ab, 4th Generation: NONREACTIVE

## 2021-02-01 NOTE — Chronic Care Management (AMB) (Signed)
  Chronic Care Management   Outreach Note  02/01/2021 Name: [name] MRN: [id number] DOB: 11/18/1955  Jenna Meyers is a 65 y.o. year old female who is a primary care patient of Danelle Berry, [place]. I reached out to Kathrin Penner by phone today in response to a referral sent by Ms. Natalia Leatherwood Borgmeyer's PCP Danelle Berry, PA-C     An unsuccessful telephone outreach was attempted today. The patient was referred to Licensed Clinical SW Follow Up Plan: A HIPAA compliant phone message was left for the patient providing contact information and requesting a return call.  The care management team will reach out to the patient again over the next 5 days.  If patient returns call to provider office, please advise to call Embedded Care Management Care Guide Tome Wilson at [phone]  Burman Nieves, CCMA Care Guide, Embedded Care Coordination [facility]  Care Management  Direct Dial: [phone]

## 2021-02-02 ENCOUNTER — Telehealth: Payer: Self-pay

## 2021-02-02 NOTE — Telephone Encounter (Signed)
Copied from CRM [id number]. Topic: General - Other >> Feb 02, 2021  4:39 PM Gaetana Michaelis A wrote: Reason for CRM: Patient would like to be contacted regarding their most recent lab results  Please contact to advise when possible

## 2021-02-03 NOTE — Telephone Encounter (Signed)
Pt called

## 2021-02-11 NOTE — Chronic Care Management (AMB) (Signed)
  Chronic Care Management   Outreach Note  02/11/2021 Name: Jenna Meyers MRN: [id number] DOB: 05-Jun-1956  Jenna Meyers is a 65 y.o. year old female who is a primary care patient of Danelle Berry, [place]. I reached out to Kathrin Penner by phone today in response to a referral sent by [name]'s PCP Danelle Berry, PA-C     A second unsuccessful telephone outreach was attempted today. The patient was referred to the case management team for assistance with care management and care coordination.   Follow Up Plan: A HIPAA compliant phone message was left for the patient providing contact information and requesting a return call.  If patient returns call to provider office, please advise to call Embedded Care Management Care Guide Aamari West at [phone]  Burman Nieves, CCMA Care Guide, Embedded Care Coordination [facility]  Care Management  Direct Dial: [phone]

## 2021-02-14 NOTE — Chronic Care Management (AMB) (Signed)
  Chronic Care Management   Outreach Note  02/14/2021 Name: Loa Idler MRN: [id number] DOB: Apr 28, 1956  Lexandra Rettke is a 65 y.o. year old female who is a primary care patient of Danelle Berry, [place]. I reached out to Kathrin Penner by phone today in response to a referral sent by Ms. Alisabeth Casto's Danelle Berry, PA-C     Third unsuccessful telephone outreach was attempted today. The patient was referred to the case management team for assistance with care management and care coordination. The patient's primary care provider has been notified of our unsuccessful attempts to make or maintain contact with the patient. The care management team is pleased to engage with this patient at any time in the future should he/she be interested in assistance from the care management team.   Follow Up Plan: We have been unable to make contact with the patient for follow up. The care management team is available to follow up with the patient after provider conversation with the patient regarding recommendation for care management engagement and subsequent re-referral to the care management team.   Burman Nieves, CCMA Care Guide, Embedded Care Coordination [facility]  Care Management  Direct Dial: [phone]

## 2021-03-14 ENCOUNTER — Ambulatory Visit: Payer: Medicare PPO | Admitting: Family Medicine

## 2021-03-24 ENCOUNTER — Ambulatory Visit: Payer: Medicare PPO | Admitting: Family Medicine

## 2021-04-04 ENCOUNTER — Other Ambulatory Visit: Payer: Self-pay

## 2021-04-04 ENCOUNTER — Encounter: Payer: Self-pay | Admitting: Family Medicine

## 2021-04-04 ENCOUNTER — Ambulatory Visit: Payer: Medicare PPO | Admitting: Family Medicine

## 2021-04-04 VITALS — BP 124/76 | HR 78 | Temp 99.5°F | Resp 16 | Ht 62.0 in | Wt 178.6 lb

## 2021-04-04 DIAGNOSIS — Z23 Encounter for immunization: Secondary | ICD-10-CM | POA: Diagnosis not present

## 2021-04-04 DIAGNOSIS — M797 Fibromyalgia: Secondary | ICD-10-CM

## 2021-04-04 DIAGNOSIS — M48061 Spinal stenosis, lumbar region without neurogenic claudication: Secondary | ICD-10-CM

## 2021-04-04 DIAGNOSIS — F331 Major depressive disorder, recurrent, moderate: Secondary | ICD-10-CM | POA: Diagnosis not present

## 2021-04-04 DIAGNOSIS — F419 Anxiety disorder, unspecified: Secondary | ICD-10-CM | POA: Diagnosis not present

## 2021-04-04 DIAGNOSIS — G8929 Other chronic pain: Secondary | ICD-10-CM

## 2021-04-04 DIAGNOSIS — K582 Mixed irritable bowel syndrome: Secondary | ICD-10-CM

## 2021-04-04 DIAGNOSIS — M545 Low back pain, unspecified: Secondary | ICD-10-CM

## 2021-04-04 DIAGNOSIS — R413 Other amnesia: Secondary | ICD-10-CM | POA: Diagnosis not present

## 2021-04-04 DIAGNOSIS — I10 Essential (primary) hypertension: Secondary | ICD-10-CM

## 2021-04-04 DIAGNOSIS — Z820 Family history of epilepsy and other diseases of the nervous system: Secondary | ICD-10-CM

## 2021-04-04 MED ORDER — METOPROLOL SUCCINATE ER 25 MG PO TB24: ORAL_TABLET | ORAL | 3 refills | Status: DC

## 2021-04-04 MED ORDER — DICYCLOMINE HCL 20 MG PO TABS: 20.0000 mg | ORAL_TABLET | Freq: Three times a day (TID) | ORAL | 2 refills | Status: DC | PRN

## 2021-04-04 MED ORDER — PREGABALIN 25 MG PO CAPS: 25.0000 mg | ORAL_CAPSULE | Freq: Two times a day (BID) | ORAL | 1 refills | Status: DC

## 2021-04-04 MED ORDER — TIZANIDINE HCL 4 MG PO TABS: 2.0000 mg | ORAL_TABLET | Freq: Three times a day (TID) | ORAL | 1 refills | Status: DC | PRN

## 2021-04-04 NOTE — Patient Instructions (Signed)
Wt Readings from Last 15 Encounters:  04/04/21 178 lb 9.6 oz (81 kg)  01/31/21 183 lb 8 oz (83.2 kg)  06/17/20 195 lb (88.5 kg)  05/25/20 195 lb 1.6 oz (88.5 kg)  05/12/20 192 lb (87.1 kg)  01/20/20 195 lb 8 oz (88.7 kg)  11/24/19 204 lb 6.4 oz (92.7 kg)  08/01/19 200 lb (90.7 kg)  05/08/19 200 lb (90.7 kg)  02/13/19 200 lb (90.7 kg)  01/06/19 200 lb (90.7 kg)  04/29/18 208 lb 14.4 oz (94.8 kg)  04/23/18 210 lb 12.8 oz (95.6 kg)  04/04/18 209 lb 6.4 oz (95 kg)  09/14/17 213 lb 11.2 oz (96.9 kg)   BMI Readings from Last 5 Encounters:  04/04/21 32.67 kg/m  01/31/21 31.50 kg/m  06/17/20 33.47 kg/m  05/25/20 33.49 kg/m  05/12/20 32.96 kg/m   Diet for Irritable Bowel Syndrome When you have irritable bowel syndrome (IBS), it is very important to eat the foods and follow the eating habits that are best for your condition. IBS may cause various symptoms such as pain in the abdomen, constipation, or diarrhea. Choosing the right foods can help to ease the discomfort from these symptoms. Work with your health care provider and diet and nutrition specialist (dietitian) to find the eating plan that will help to control your symptoms. What are tips for following this plan?     Keep a food diary. This will help you identify foods that cause symptoms. Write down: What you eat and when you eat it. What symptoms you have. When symptoms occur in relation to your meals, such as "pain in abdomen 2 hours after dinner." Eat your meals slowly and in a relaxed setting. Aim to eat 5-6 small meals per day. Do not skip meals. Drink enough fluid to keep your urine pale yellow. Ask your health care provider if you should take an over-the-counter probiotic to help restore healthy bacteria in your gut (digestive tract). Probiotics are foods that contain good bacteria and yeasts. Your dietitian may have specific dietary recommendations for you based on your symptoms. He or she may recommend that  you: Avoid foods that cause symptoms. Talk with your dietitian about other ways to get the same nutrients that are in those problem foods. Avoid foods with gluten. Gluten is a protein that is found in rye, wheat, and barley. Eat more foods that contain soluble fiber. Examples of foods with high soluble fiber include oats, seeds, and certain fruits and vegetables. Take a fiber supplement if directed by your dietitian. Reduce or avoid certain foods called FODMAPs. These are foods that contain carbohydrates that are hard to digest. Ask your doctor which foods contain these carbohydrates. What foods are not recommended? The following are some foods and drinks that may make your symptoms worse: Fatty foods, such as french fries. Foods that contain gluten, such as pasta and cereal. Dairy products, such as milk, cheese, and ice cream. Chocolate. Alcohol. Products with caffeine, such as coffee. Carbonated drinks, such as soda. Foods that are high in FODMAPs. These include certain fruits and vegetables. Products with sweeteners such as honey, high fructose corn syrup, sorbitol, and mannitol. The items listed above may not be a complete list of foods and beverages youshould avoid. Contact a dietitian for more information. What foods are good sources of fiber? Your health care provider or dietitian may recommend that you eat more foods that contain fiber. Fiber can help to reduce constipation and other IBS symptoms. Add foods with fiber to your  diet a little at a time so your body can get used to them. Too much fiber at one time might cause gas and swelling of your abdomen. The following are some foods that are good sources of fiber: Berries, such as raspberries, strawberries, and blueberries. Tomatoes. Carrots. Brown rice. Oats. Seeds, such as chia and pumpkin seeds. The items listed above may not be a complete list of recommended sources offiber. Contact your dietitian for more options. Where to  find more information International Foundation for Functional Gastrointestinal Disorders: www.iffgd.[employer] of Diabetes and Digestive and Kidney Diseases: [employer].tn Summary When you have irritable bowel syndrome (IBS), it is very important to eat the foods and follow the eating habits that are best for your condition. IBS may cause various symptoms such as pain in the abdomen, constipation, or diarrhea. Choosing the right foods can help to ease the discomfort that comes from symptoms. Keep a food diary. This will help you identify foods that cause symptoms. Your health care provider or diet and nutrition specialist (dietitian) may recommend that you eat more foods that contain fiber. This information is not intended to replace advice given to you by your health care provider. Make sure you discuss any questions you have with your healthcare provider. Document Revised: 04/08/2020 Document Reviewed: 04/08/2020 Elsevier Patient Education  2022 [employer].

## 2021-04-04 NOTE — Progress Notes (Signed)
Name: Jenna Meyers   MRN: [id number]    DOB: 01/31/1956   Date:04/04/2021       Progress Note  Chief Complaint  Patient presents with   Follow-up    Lexapro     Subjective:   Jenna Meyers is a 65 y.o. female, presents to clinic for f/up on med change and to address labs/f/up on chronic conditions  Changed meds last ov 2 months ago  Depression screen [facility] 2/9 04/04/2021 01/31/2021 05/25/2020  Decreased Interest 0 1 1  Down, Depressed, Hopeless 1 3 1   PHQ - 2 Score 1 4 2   Altered sleeping 0 2 1  Tired, decreased energy 0 1 1  Change in appetite 1 1 1   Feeling bad or failure about yourself  0 3 0  Trouble concentrating 0 1 0  Moving slowly or fidgety/restless 0 2 0  Suicidal thoughts - 0 0  PHQ-9 Score 2 14 -  Difficult doing work/chores - Somewhat difficult Somewhat difficult  Some recent data might be hidden   GAD 7 : Generalized Anxiety Score 04/04/2021 01/31/2021 01/09/2019  Nervous, Anxious, on Edge 1 2 1   Control/stop worrying 0 3 1  Worry too much - different things 1 3 1   Trouble relaxing 0 2 0  Restless 0 1 0  Easily annoyed or irritable 1 3 0  Afraid - awful might happen 0 2 0  Total GAD 7 Score 3 16 3   Anxiety Difficulty Not difficult at all Somewhat difficult Not difficult at all   Recently stopped fibromyalgia meds and cymbalta and started  lexapro 10 mg  scores better for phq and gad 7 reviewed today Her kids have remarked that she seems better She is hyperactive but denies any euphoria, risk taking behavior Pain does seem to interfere with her ability to concentrate  Most pain is in hips and low back  Hyperlipidemia: Currently treated with lipitor 80 mg - last labs LDL uncontrolled/worse She has been better with diet/lifestyle and med compliance, wants to wait and recheck labs at next appt Last Lipids: Lab Results  Component Value Date   CHOL 215 (H) 01/31/2021   HDL 50 01/31/2021   LDLCALC 136 (H) 01/31/2021   TRIG 155 (H) 01/31/2021   CHOLHDL  4.3 01/31/2021   - Denies: Chest pain, shortness of breath, myalgias, claudication  FMS - on myuscle relaxers, stopped cymbalta and last OV stopped lyrica Was on gabapentin in the past  Hypertension/palpitations Currently managed on metoprolol 25 mg once daily  Pt reports good med compliance and denies any SE.   Blood pressure today is well controlled. BP Readings from Last 3 Encounters:  04/04/21 124/76  01/31/21 122/66  05/25/20 124/76   Pulse Readings from Last 3 Encounters:  04/04/21 78  01/31/21 81  05/25/20 84    Pt denies CP, SOB, exertional sx, LE edema, palpitation, Ha's, visual disturbances, lightheadedness, hypotension, syncope.  Emphysema on albuterol only - stopped smoking for over a year, lung function has continue to improve, not using albuterol  Obesity - she has lost about 30 lbs since last year - weight reviewed Wt Readings from Last 10 Encounters:  04/04/21 178 lb 9.6 oz (81 kg)  01/31/21 183 lb 8 oz (83.2 kg)  06/17/20 195 lb (88.5 kg)  05/25/20 195 lb 1.6 oz (88.5 kg)  05/12/20 192 lb (87.1 kg)  01/20/20 195 lb 8 oz (88.7 kg)  11/24/19 204 lb 6.4 oz (92.7 kg)  08/01/19 200 lb (90.7 kg)  05/08/19  200 lb (90.7 kg)  02/13/19 200 lb (90.7 kg)   BMI Readings from Last 5 Encounters:  04/04/21 32.67 kg/m  01/31/21 31.50 kg/m  06/17/20 33.47 kg/m  05/25/20 33.49 kg/m  05/12/20 32.96 kg/m       Current Outpatient Medications:    albuterol (PROVENTIL HFA;VENTOLIN HFA) 108 (90 Base) MCG/ACT inhaler, INHALE 2 PUFFS BY MOUTH  EVERY 4 HOURS AS NEEDED FOR WHEEZING OR SHORTNESS OF  BREATH, Disp: 3 Inhaler, Rfl: 0   atorvastatin (LIPITOR) 80 MG tablet, Take 1 tablet (80 mg total) by mouth at bedtime., Disp: 90 tablet, Rfl: 3   Cholecalciferol (VITAMIN D3) 125 MCG (5000 UT) CAPS, Take by mouth. Pt takes once a week , Disp: , Rfl:    escitalopram (LEXAPRO) 10 MG tablet, Take 1 tablet (10 mg total) by mouth daily., Disp: 90 tablet, Rfl: 1   metoprolol  succinate (TOPROL-XL) 25 MG 24 hr tablet, TAKE 1 TABLET BY MOUTH EVERY DAY FOR BLOOD PRESSURE, Disp: 90 tablet, Rfl: 0   tiZANidine (ZANAFLEX) 4 MG tablet, Take 0.5-1.5 tablets (2-6 mg total) by mouth every 8 (eight) hours as needed for muscle spasms., Disp: 90 tablet, Rfl: 0  Patient Active Problem List   Diagnosis Date Noted   Hyperlipidemia 08/12/2019   Vitamin D deficiency disease 08/12/2019   Hallucinations, visual 03/09/2019   Morbid obesity (HCC) 04/14/2018   Moderate episode of recurrent major depressive disorder (HCC) 04/14/2018   Aortic insufficiency 03/19/2018   LVH (left ventricular hypertrophy) 03/19/2018   Grade I diastolic dysfunction 03/19/2018   Mitral regurgitation 03/19/2018   Lumbar canal stenosis 09/14/2017   Emphysema of lung (HCC) 05/31/2017   Palpitations 05/30/2017   Medication monitoring encounter 02/09/2016   Essential hypertension, benign 05/17/2015   Acute allergic reaction 05/17/2015   Situational stress 05/17/2015   Obesity 03/03/2015   Abnormal thyroid blood test 03/03/2015   Tobacco abuse 03/03/2015   Lumbago    Fibromyalgia    Anxiety 10/09/2012    Past Surgical History:  Procedure Laterality Date   ABDOMINAL HYSTERECTOMY  2000   partial, still has cervix   CESAREAN SECTION  1996   LAPAROSCOPIC ABDOMINAL EXPLORATION  1999   TONSILLECTOMY AND ADENOIDECTOMY  1962    Family History  Problem Relation Age of Onset   Cancer Mother        colon   Osteoporosis Mother    Hyperlipidemia Mother    Alzheimer's disease Father    Thyroid disease Son    Heart disease Brother     Social History   Tobacco Use   Smoking status: Every Day    Packs/day: 1.50    Years: 34.00    Pack years: 51.00    Types: Cigarettes   Smokeless tobacco: Never   Tobacco comments:    been given a Rx for Chantix quit for 1 yr and started back  Vaping Use   Vaping Use: Never used  Substance Use Topics   Alcohol use: Yes    Comment: rare   Drug use: No      No Known Allergies  Health Maintenance  Topic Date Due   Pneumococcal Vaccine 106-35 Years old (1 - PCV) Never done   MAMMOGRAM  01/22/2021   INFLUENZA VACCINE  03/21/2021   PAP SMEAR-Modifier  04/29/2021   COVID-19 Vaccine (4 - Booster for Moderna series) 04/19/2021 (Originally 03/04/2021)   COLONOSCOPY (Pts 45-41yrs Insurance coverage will need to be confirmed)  05/20/2021 (Originally 06/27/2002)   TETANUS/TDAP  09/02/2025  Hepatitis C Screening  Completed   HIV Screening  Completed   Zoster Vaccines- Shingrix  Completed   HPV VACCINES  Aged Out    Chart Review Today: I personally reviewed active problem list, medication list, allergies, family history, social history, health maintenance, notes from last encounter, lab results, imaging with the patient/caregiver today.'   Review of Systems  Constitutional: Negative.   HENT: Negative.    Eyes: Negative.   Respiratory: Negative.    Cardiovascular: Negative.   Gastrointestinal: Negative.   Endocrine: Negative.   Genitourinary: Negative.   Musculoskeletal: Negative.   Skin: Negative.   Allergic/Immunologic: Negative.   Neurological: Negative.   Hematological: Negative.   Psychiatric/Behavioral: Negative.    All other systems reviewed and are negative.   Objective:   Vitals:   04/04/21 1343  BP: 124/76  Pulse: 78  Resp: 16  Temp: 99.5 F (37.5 C)  TempSrc: Oral  SpO2: 97%  Weight: 178 lb 9.6 oz (81 kg)  Height: 5\' 2"  (1.575 m)    Body mass index is 32.67 kg/m.  Physical Exam Vitals and nursing note reviewed.  Constitutional:      Appearance: Normal appearance. She is well-developed, well-groomed and overweight. She is not ill-appearing, toxic-appearing or diaphoretic.     Interventions: Face mask in place.  HENT:     Head: Normocephalic and atraumatic.     Right Ear: External ear normal.     Left Ear: External ear normal.  Eyes:     General:        Right eye: No discharge.        Left eye: No  discharge.     Conjunctiva/sclera: Conjunctivae normal.  Cardiovascular:     Rate and Rhythm: Normal rate and regular rhythm.     Pulses: Normal pulses.     Heart sounds: Normal heart sounds. No murmur heard.   No friction rub. No gallop.  Pulmonary:     Effort: Pulmonary effort is normal. No respiratory distress.     Breath sounds: Normal breath sounds. No stridor. No wheezing or rhonchi.  Abdominal:     General: Bowel sounds are normal. There is no distension.     Palpations: Abdomen is soft.     Tenderness: There is no abdominal tenderness.  Musculoskeletal:     Right lower leg: No edema.     Left lower leg: No edema.  Lymphadenopathy:     Cervical: No cervical adenopathy.  Skin:    General: Skin is warm and dry.     Capillary Refill: Capillary refill takes less than 2 seconds.     Coloration: Skin is not jaundiced or pale.     Findings: No lesion or rash.  Neurological:     Mental Status: She is alert. Mental status is at baseline.     Gait: Gait normal.  Psychiatric:        Attention and Perception: Perception normal. She is inattentive.        Mood and Affect: Mood and affect normal.        Speech: Speech is rapid and pressured and tangential.        Behavior: Behavior is hyperactive. Behavior is cooperative.        Thought Content: Thought content normal. Thought content does not include homicidal or suicidal ideation. Thought content does not include homicidal or suicidal plan.        Cognition and Memory: Cognition is not impaired. She exhibits impaired recent memory. She does not exhibit  impaired remote memory.        Judgment: Judgment normal.        Assessment & Plan:     ICD-10-CM   1. Moderate episode of recurrent major depressive disorder (HCC)  F33.1    depressive sx improved on lexapro - she will would like to talk to CCM SW, continue lexapro, therapy recommended    2. Anxiety  F41.9    long hx of anxiety and panic attacks    3. Memory problem  R41.3  Ambulatory referral to Neurology   short term forgetfulness of things discussed yesterday, repeating herself for years, family worried, no problem with managing meds/finances     4. Family history of Alzheimer's disease  Z82.0 Ambulatory referral to Neurology   early onset at age 65, pt concerned with developing sx and wants consult    5. Fibromyalgia  M79.7 tiZANidine (ZANAFLEX) 4 MG tablet    pregabalin (LYRICA) 25 MG capsule   discussed cymbalta dosing, since MDD and anxiety have indication for high dose usage will increase, but discussed how may not help pain, and lyrica may help    6. Foraminal stenosis of lumbar region  M48.061 tiZANidine (ZANAFLEX) 4 MG tablet    pregabalin (LYRICA) 25 MG capsule   restart lyrica, refill on muscle relaxers, add NSAIDs as tolerated and tylenol prn    7. Chronic low back pain, unspecified back pain laterality, unspecified whether sciatica present  M54.50 tiZANidine (ZANAFLEX) 4 MG tablet   G89.29 pregabalin (LYRICA) 25 MG capsule   see above    8. Essential hypertension, benign  I10 metoprolol succinate (TOPROL-XL) 25 MG 24 hr tablet   stable, well controlled, BP at goal today on 25 mg metoprolol     9. Irritable bowel syndrome with both constipation and diarrhea  K58.2 dicyclomine (BENTYL) 20 MG tablet   advised working on diet and stress triggers, trial of bentyl, minimize anti-diarrhea meds since they seem to cause more pain/constipation    10. Need for pneumococcal vaccine  Z23 Pneumococcal conjugate vaccine 20-valent (Prevnar 20)       Neuro consult for memory per pt request  Return for mid october CPE with labs .   Danelle BerryLeisa Ahna Konkle, PA-C 04/04/21 2:01 PM

## 2021-04-18 ENCOUNTER — Telehealth: Payer: Self-pay

## 2021-04-18 ENCOUNTER — Telehealth: Payer: Medicare PPO

## 2021-04-18 ENCOUNTER — Emergency Department: Payer: Medicare PPO

## 2021-04-18 ENCOUNTER — Other Ambulatory Visit: Payer: Self-pay

## 2021-04-18 ENCOUNTER — Ambulatory Visit: Payer: Self-pay | Admitting: *Deleted

## 2021-04-18 ENCOUNTER — Emergency Department
Admission: EM | Admit: 2021-04-18 | Discharge: 2021-04-18 | Disposition: A | Discharge status: Home / Self Care | Payer: Medicare PPO | Attending: Emergency Medicine | Admitting: Emergency Medicine

## 2021-04-18 DIAGNOSIS — Z5321 Procedure and treatment not carried out due to patient leaving prior to being seen by health care provider: Secondary | ICD-10-CM | POA: Diagnosis not present

## 2021-04-18 DIAGNOSIS — J449 Chronic obstructive pulmonary disease, unspecified: Secondary | ICD-10-CM | POA: Insufficient documentation

## 2021-04-18 DIAGNOSIS — I10 Essential (primary) hypertension: Secondary | ICD-10-CM | POA: Insufficient documentation

## 2021-04-18 DIAGNOSIS — R413 Other amnesia: Secondary | ICD-10-CM | POA: Diagnosis not present

## 2021-04-18 DIAGNOSIS — R001 Bradycardia, unspecified: Secondary | ICD-10-CM | POA: Insufficient documentation

## 2021-04-18 DIAGNOSIS — R079 Chest pain, unspecified: Secondary | ICD-10-CM | POA: Insufficient documentation

## 2021-04-18 DIAGNOSIS — R42 Dizziness and giddiness: Secondary | ICD-10-CM | POA: Diagnosis not present

## 2021-04-18 LAB — BASIC METABOLIC PANEL
Anion gap: 7 (ref 5–15)
BUN: 19 mg/dL (ref 8–23)
CO2: 23 mmol/L (ref 22–32)
Calcium: 9.3 mg/dL (ref 8.9–10.3)
Chloride: 108 mmol/L (ref 98–111)
Creatinine, Ser: 0.85 mg/dL (ref 0.44–1.00)
GFR, Estimated: >60 mL/min (ref >60)
Glucose, Bld: 85 mg/dL (ref 70–99)
Potassium: 4.2 mmol/L (ref 3.5–5.1)
Sodium: 138 mmol/L (ref 135–145)

## 2021-04-18 LAB — CBC
HCT: 36 % (ref 36.0–46.0)
Hemoglobin: 12.6 g/dL (ref 12.0–15.0)
MCH: 31.8 pg (ref 26.0–34.0)
MCHC: 35 g/dL (ref 30.0–36.0)
MCV: 90.9 fL (ref 80.0–100.0)
Platelets: 275 10*3/uL (ref 150–400)
RBC: 3.96 MIL/uL (ref 3.87–5.11)
RDW: 12.5 % (ref 11.5–15.5)
WBC: 8.8 10*3/uL (ref 4.0–10.5)
nRBC: 0 % (ref 0.0–0.2)

## 2021-04-18 LAB — TROPONIN I (HIGH SENSITIVITY): Troponin I (High Sensitivity): 5 ng/L (ref <18)

## 2021-04-18 NOTE — Telephone Encounter (Signed)
Called patient for CC screening. LVM to call office back. 

## 2021-04-18 NOTE — Telephone Encounter (Signed)
Patient is calling to report she is having possible symptoms from Pregabalin- she has stopped taking. Last dose - 2 days ago. Patient states her family/herself noticed in creased dizziness,lightheadedness, fogginess", memory loss. Patient states the medication did her her arthritis pain- but the SE are very apparent. Patient request other options if any.    Reason for Disposition  [1] Caller has URGENT medicine question about med that PCP or specialist prescribed AND [2] triager unable to answer question  Answer Assessment - Initial Assessment Questions 1. NAME of MEDICATION: "What medicine are you calling about?"     Pregabalin  2. QUESTION: "What is your question?" (e.g., double dose of medicine, side effect)     Possible SE to medication 3. PRESCRIBING HCP: "Who prescribed it?" Reason: if prescribed by specialist, call should be referred to that group.     PCP 4. SYMPTOMS: "Do you have any symptoms?"     Mental changes- foggy, visual auras, light headedness 5. SEVERITY: If symptoms are present, ask "Are they mild, moderate or severe?"     Moderate/severe 6. PREGNANCY:  "Is there any chance that you are pregnant?" "When was your last menstrual period?"     N/a  Protocols used: Medication Question Call-A-AH

## 2021-04-18 NOTE — ED Notes (Signed)
Pt comes via EMs with c/o dizzy for two days. EMs reports HR in 40s. SB 115/58 99% CBG-91

## 2021-04-18 NOTE — Telephone Encounter (Signed)
Patient called back today to report she is concerned of recent memory deficits and repeating herself to her children of the same things . Difficulties noted with basic problems. C/o heart rate in the 40-44 range today . C/o chest heaviness center of chest but not bad enough to go to ED. Patient noted to be more anxious during call. Rechecked heart rate for 48 bpm with continued chest heaviness while at rest. Patient denies difficulty breathing , sweating, dizziness or lightheadedness. Noticed increased memory issues and confusion at times. Encouraged patient to go to ED for evaluation for bradycardia. Patient reports she will call 911 and go to ED for evaluation due to symptoms no getting better.

## 2021-04-18 NOTE — ED Triage Notes (Signed)
Pt stating one week ago started feeling light headed and dizzy. Pt stating she has been having memory issues. Pt stating HR has been low 40s. Pt endorses intermittent CP. Pt with recent med change for fibromyalgia. Pt placed on lyrica. Pt stating at home HR was in the 40s. Pt told by PCP to come get evaluated. Pt also having trouble finding words. Pt with hx COPD and HTN

## 2021-04-18 NOTE — Telephone Encounter (Signed)
Reason for Disposition  Heart beating very slowly (e.g., < 50 / minute)  (Exception: athlete and heart rate normal for caller)  Answer Assessment - Initial Assessment Questions 1. DESCRIPTION: "Please describe your heart rate or heartbeat that you are having" (e.g., fast/slow, regular/irregular, skipped or extra beats, "palpitations")     None  2. ONSET: "When did it start?" (Minutes, hours or days)      Few hours ago , no specific time given.  3. DURATION: "How long does it last" (e.g., seconds, minutes, hours)     Not sure  4. PATTERN "Does it come and go, or has it been constant since it started?"  "Does it get worse with exertion?"   "Are you feeling it now?"     Comes and goes  5. TAP: "Using your hand, can you tap out what you are feeling on a chair or table in front of you, so that I can hear?" (Note: not all patients can do this)       na 6. HEART RATE: "Can you tell me your heart rate?" "How many beats in 15 seconds?"  (Note: not all patients can do this)       48 bpm 7. RECURRENT SYMPTOM: "Have you ever had this before?" If Yes, ask: "When was the last time?" and "What happened that time?"      No  8. CAUSE: "What do you think is causing the palpitations?"     Not sure  9. CARDIAC HISTORY: "Do you have any history of heart disease?" (e.g., heart attack, angina, bypass surgery, angioplasty, arrhythmia)      None  10. OTHER SYMPTOMS: "Do you have any other symptoms?" (e.g., dizziness, chest pain, sweating, difficulty breathing)       No  11. PREGNANCY: "Is there any chance you are pregnant?" "When was your last menstrual period?"       na  Protocols used: Heart Rate and Heartbeat Questions-A-AH

## 2021-04-22 ENCOUNTER — Ambulatory Visit: Payer: Self-pay

## 2021-04-22 NOTE — Telephone Encounter (Signed)
Pt. Asking if Lyrica can cause memory loss and bradycardia. Stopped the medication and symptoms have resolved. Asking if there is something else she can take for chronic pain. Uses [facility]. Please advise pt.    Answer Assessment - Initial Assessment Questions 1. NAME of MEDICATION: "What medicine are you calling about?"     Lyrica 2. QUESTION: "What is your question?" (e.g., double dose of medicine, side effect)     Side effects 3. PRESCRIBING HCP: "Who prescribed it?" Reason: if prescribed by specialist, call should be referred to that group.     Tapia 4. SYMPTOMS: "Do you have any symptoms?"     Memory loss, bradycardia 5. SEVERITY: If symptoms are present, ask "Are they mild, moderate or severe?"     Moderate 6. PREGNANCY:  "Is there any chance that you are pregnant?" "When was your last menstrual period?"     No  Protocols used: Medication Question Call-A-AH

## 2021-04-28 NOTE — Telephone Encounter (Signed)
Tried calling pt 3 times and recording came on stating " call will not go through at this time"

## 2021-04-28 NOTE — Telephone Encounter (Signed)
Patient will need an appt to discuss with provider.

## 2021-04-29 NOTE — Telephone Encounter (Signed)
Sent patient a Clinical cytogeneticist message asking her to schedule an appointment with our office to discuss medication issues.

## 2021-05-12 ENCOUNTER — Ambulatory Visit: Payer: Medicare PPO

## 2021-05-16 ENCOUNTER — Telehealth: Payer: Self-pay | Admitting: Family Medicine

## 2021-05-16 NOTE — Telephone Encounter (Signed)
Copied from CRM [id number]. Topic: Medicare AWV >> May 16, 2021  9:27 AM Claudette Laws R wrote: Reason for CRM:  No answer unable to leave a message for patient to call back and schedule Medicare Annual Wellness Visit (AWV) in office.   If unable to come into the office for AWV,  please offer to do virtually or by telephone.  Last AWV: 05/11/2020  Please schedule at anytime with [facility] Health Advisor.  40 minute appointment  Any questions, please contact me at [phone]

## 2021-06-02 ENCOUNTER — Encounter: Payer: Medicare PPO | Admitting: Family Medicine

## 2021-06-06 ENCOUNTER — Other Ambulatory Visit: Payer: Self-pay | Admitting: Family Medicine

## 2021-06-06 DIAGNOSIS — F439 Reaction to severe stress, unspecified: Secondary | ICD-10-CM

## 2021-06-06 MED ORDER — ESCITALOPRAM OXALATE 10 MG PO TABS
10.0000 mg | ORAL_TABLET | Freq: Every day | ORAL | 1 refills | Status: DC
Start: 2021-06-06 — End: 2021-06-21

## 2021-06-06 NOTE — Telephone Encounter (Signed)
Requested Prescriptions  Pending Prescriptions Disp Refills  . escitalopram (LEXAPRO) 10 MG tablet 90 tablet 1    Sig: Take 1 tablet (10 mg total) by mouth daily.     Psychiatry:  Antidepressants - SSRI Passed - 06/06/2021  4:34 PM      Passed - Completed PHQ-2 or PHQ-9 in the last 360 days      Passed - Valid encounter within last 6 months    Recent Outpatient Visits          2 months ago Moderate episode of recurrent major depressive disorder [facility])   [facility] Danelle Berry, PA-C   4 months ago Anxiety disorder, unspecified type   [facility] Danelle Berry, PA-C   1 year ago Annual physical exam   [facility] Danelle Berry, PA-C   1 year ago Mixed hyperlipidemia   [facility] Danelle Berry, PA-C   1 year ago Encounter for chronic pain management   [facility] Danelle Berry, PA-C      Future Appointments            In 2 weeks Danelle Berry, PA-C [facility]   In 1 month Danelle Berry, PA-C [facility], [facility]

## 2021-06-06 NOTE — Telephone Encounter (Signed)
Medication Refill - Medication: Lexapro  Has the patient contacted their pharmacy? No. Pt called with multiple requests and asked to have this one taken care of as well. Pt is requesting to have a possible increase in this medication. Please advise.  (Agent: If no, request that the patient contact the pharmacy for the refill.) (Agent: If yes, when and what did the pharmacy advise?)  Preferred Pharmacy (with phone number or street name):  [facility] [address] [place] - [address]  [address] [place] [address]  Phone: [phone] Fax: [phone]  Hours: Not open 24 hours   Has the patient been seen for an appointment in the last year OR does the patient have an upcoming appointment? Yes.    Agent: Please be advised that RX refills may take up to 3 business days. We ask that you follow-up with your pharmacy.

## 2021-06-21 ENCOUNTER — Encounter: Payer: Self-pay | Admitting: Family Medicine

## 2021-06-21 ENCOUNTER — Ambulatory Visit: Payer: Medicare PPO | Admitting: Family Medicine

## 2021-06-21 ENCOUNTER — Other Ambulatory Visit: Payer: Self-pay

## 2021-06-21 VITALS — BP 110/68 | HR 97 | Temp 98.3°F | Resp 16 | Ht 62.0 in | Wt 177.6 lb

## 2021-06-21 DIAGNOSIS — E782 Mixed hyperlipidemia: Secondary | ICD-10-CM | POA: Diagnosis not present

## 2021-06-21 DIAGNOSIS — F419 Anxiety disorder, unspecified: Secondary | ICD-10-CM | POA: Diagnosis not present

## 2021-06-21 DIAGNOSIS — M797 Fibromyalgia: Secondary | ICD-10-CM

## 2021-06-21 DIAGNOSIS — I10 Essential (primary) hypertension: Secondary | ICD-10-CM

## 2021-06-21 DIAGNOSIS — R001 Bradycardia, unspecified: Secondary | ICD-10-CM

## 2021-06-21 DIAGNOSIS — G8929 Other chronic pain: Secondary | ICD-10-CM

## 2021-06-21 DIAGNOSIS — M48061 Spinal stenosis, lumbar region without neurogenic claudication: Secondary | ICD-10-CM

## 2021-06-21 DIAGNOSIS — F331 Major depressive disorder, recurrent, moderate: Secondary | ICD-10-CM | POA: Diagnosis not present

## 2021-06-21 DIAGNOSIS — K589 Irritable bowel syndrome without diarrhea: Secondary | ICD-10-CM

## 2021-06-21 DIAGNOSIS — Z23 Encounter for immunization: Secondary | ICD-10-CM | POA: Diagnosis not present

## 2021-06-21 DIAGNOSIS — J439 Emphysema, unspecified: Secondary | ICD-10-CM

## 2021-06-21 DIAGNOSIS — M545 Low back pain, unspecified: Secondary | ICD-10-CM

## 2021-06-21 DIAGNOSIS — Z5181 Encounter for therapeutic drug level monitoring: Secondary | ICD-10-CM

## 2021-06-21 DIAGNOSIS — Z1211 Encounter for screening for malignant neoplasm of colon: Secondary | ICD-10-CM

## 2021-06-21 DIAGNOSIS — F439 Reaction to severe stress, unspecified: Secondary | ICD-10-CM

## 2021-06-21 MED ORDER — BACLOFEN 10 MG PO TABS: 5.0000 mg | ORAL_TABLET | Freq: Three times a day (TID) | ORAL | 1 refills | Status: DC

## 2021-06-21 MED ORDER — ESCITALOPRAM OXALATE 10 MG PO TABS: 15.0000 mg | ORAL_TABLET | Freq: Every day | ORAL | 1 refills | Status: DC

## 2021-06-21 MED ORDER — GABAPENTIN 100 MG PO CAPS
100.0000 mg | ORAL_CAPSULE | Freq: Three times a day (TID) | ORAL | 3 refills | Status: DC
Start: 2021-06-21 — End: 2021-09-26

## 2021-06-21 NOTE — Progress Notes (Signed)
Name: Jenna Meyers   MRN: [id number]    DOB: 1956-01-19   Date:06/21/2021       Progress Note  Chief Complaint  Patient presents with   Follow-up   Hypertension   Anxiety   Fibromyalgia     Subjective:   Jenna Meyers is a 65 y.o. female, presents to clinic for   Hypertension:  Currently managed on toprol xl 25 mg daily - maybe taking every other day  Pt reports intermittent med compliance Bradycardia, near syncope ER visit - left w/o being seen - she called the office and was concerned about low HR and thought it was from lyrica + BB - actually not taking BB every day  Blood pressure today is well controlled. BP Readings from Last 6 Encounters:  06/21/21 110/68  04/18/21 114/68  04/04/21 124/76  01/31/21 122/66  05/25/20 124/76  05/12/20 128/84   Pulse Readings from Last 3 Encounters:  06/21/21 97  04/18/21 (!) 48  04/04/21 78   Pt denies CP, SOB, exertional sx, LE edema, palpitation, Ha's, visual disturbances, lightheadedness, hypotension, syncope. Dietary efforts for BP?  Continues to work on diet/lifestyle   Chronic pain/anxiety - stopped lyrica - was concerned it was causing memory problems and bradycardia - we were never able to get pt in for f/up appt regarding this   On tizanidine, lyrica lexapro Depression screen [facility] 2/9 06/21/2021 04/04/2021 01/31/2021  Decreased Interest 0 0 1  Down, Depressed, Hopeless 1 1 3   PHQ - 2 Score 1 1 4   Altered sleeping 0 0 2  Tired, decreased energy 0 0 1  Change in appetite 1 1 1   Feeling bad or failure about yourself  0 0 3  Trouble concentrating 0 0 1  Moving slowly or fidgety/restless 0 0 2  Suicidal thoughts 0 - 0  PHQ-9 Score 2 2 14   Difficult doing work/chores Not difficult at all - Somewhat difficult  Some recent data might be hidden   GAD 7 : Generalized Anxiety Score 06/21/2021 04/04/2021 01/31/2021 01/09/2019  Nervous, Anxious, on Edge 0 1 2 1   Control/stop worrying 1 0 3 1  Worry too much - different things  0 1 3 1   Trouble relaxing 0 0 2 0  Restless 0 0 1 0  Easily annoyed or irritable 1 1 3  0  Afraid - awful might happen 0 0 2 0  Total GAD 7 Score 2 3 16 3   Anxiety Difficulty Somewhat difficult Not difficult at all Somewhat difficult Not difficult at all   Hyperlipidemia: Currently treated with statin - she reports better compliance - but not daily Last Lipids: Lab Results  Component Value Date   CHOL 215 (H) 01/31/2021   HDL 50 01/31/2021   LDLCALC 136 (H) 01/31/2021   TRIG 155 (H) 01/31/2021   CHOLHDL 4.3 01/31/2021   - Denies: Chest pain, shortness of breath, myalgias, claudication     Current Outpatient Medications:    atorvastatin (LIPITOR) 80 MG tablet, Take 1 tablet (80 mg total) by mouth at bedtime., Disp: 90 tablet, Rfl: 3   Cholecalciferol (VITAMIN D3) 125 MCG (5000 UT) CAPS, Take by mouth. Pt takes once a week , Disp: , Rfl:    dicyclomine (BENTYL) 20 MG tablet, Take 1 tablet (20 mg total) by mouth 3 (three) times daily as needed for spasms (abd cramping)., Disp: 60 tablet, Rfl: 2   escitalopram (LEXAPRO) 10 MG tablet, Take 1 tablet (10 mg total) by mouth daily., Disp: 90 tablet, Rfl: 1  metoprolol succinate (TOPROL-XL) 25 MG 24 hr tablet, TAKE 1 TABLET BY MOUTH EVERY DAY FOR BLOOD PRESSURE, Disp: 90 tablet, Rfl: 3   tiZANidine (ZANAFLEX) 4 MG tablet, Take 0.5-1.5 tablets (2-6 mg total) by mouth every 8 (eight) hours as needed for muscle spasms., Disp: 90 tablet, Rfl: 1   pregabalin (LYRICA) 25 MG capsule, Take 1-2 capsules (25-50 mg total) by mouth 2 (two) times daily. (Patient not taking: Reported on 06/21/2021), Disp: 360 capsule, Rfl: 1  Patient Active Problem List   Diagnosis Date Noted   Hyperlipidemia 08/12/2019   Vitamin D deficiency disease 08/12/2019   Hallucinations, visual 03/09/2019   Morbid obesity ([place]) 04/14/2018   Moderate episode of recurrent major depressive disorder ([place]) 04/14/2018   Aortic insufficiency 03/19/2018   LVH (left ventricular  hypertrophy) 03/19/2018   Grade I diastolic dysfunction [id number]   Mitral regurgitation 03/19/2018   Lumbar canal stenosis 09/14/2017   Emphysema of lung ([place]) 05/31/2017   Palpitations 05/30/2017   Medication monitoring encounter 02/09/2016   Essential hypertension, benign 05/17/2015   Acute allergic reaction 05/17/2015   Situational stress 05/17/2015   Obesity 03/03/2015   Abnormal thyroid blood test 03/03/2015   Tobacco abuse 03/03/2015   Lumbago    Fibromyalgia    Anxiety 10/09/2012    Past Surgical History:  Procedure Laterality Date   ABDOMINAL HYSTERECTOMY  2000   partial, still has cervix   [place]    Family History  Problem Relation Age of Onset   Cancer Mother        colon   Osteoporosis Mother    Hyperlipidemia Mother    Alzheimer's disease Father    Thyroid disease Son    Heart disease Brother     Social History   Tobacco Use   Smoking status: Every Day    Packs/day: 1.50    Years: 34.00    Pack years: 51.00    Types: Cigarettes   Smokeless tobacco: Never   Tobacco comments:    been given a Rx for Chantix quit for 1 yr and started back  Vaping Use   Vaping Use: Never used  Substance Use Topics   Alcohol use: Yes    Comment: rare   Drug use: No     No Known Allergies  Health Maintenance  Topic Date Due   COLONOSCOPY (Pts 45-64yrs Insurance coverage will need to be confirmed)  Never done   COVID-19 Vaccine (4 - Booster for Moderna series) 12/28/2020   MAMMOGRAM  01/22/2021   PAP SMEAR-Modifier  06/21/2021 (Originally 04/29/2021)   TETANUS/TDAP  09/02/2025   Pneumococcal Vaccine 37-62 Years old  Completed   INFLUENZA VACCINE  Completed   Hepatitis C Screening  Completed   HIV Screening  Completed   Zoster Vaccines- Shingrix  Completed   HPV VACCINES  Aged Out    Chart Review Today: I personally reviewed active problem list, medication  list, allergies, family history, social history, health maintenance, notes from last encounter, lab results, imaging with the patient/caregiver today.   Review of Systems  Constitutional: Negative.   HENT: Negative.    Eyes: Negative.   Respiratory: Negative.    Cardiovascular: Negative.   Gastrointestinal: Negative.   Endocrine: Negative.   Genitourinary: Negative.   Musculoskeletal: Negative.   Skin: Negative.   Allergic/Immunologic: Negative.   Neurological: Negative.   Hematological: Negative.   Psychiatric/Behavioral: Negative.    All other  systems reviewed and are negative.   Objective:   Vitals:   06/21/21 1414  BP: 110/68  Pulse: 97  Resp: 16  Temp: 98.3 F (36.8 C)  TempSrc: Oral  SpO2: 98%  Weight: 177 lb 9.6 oz (80.6 kg)  Height: 5\' 2"  (1.575 m)    Body mass index is 32.48 kg/m.  Physical Exam Vitals and nursing note reviewed.  Constitutional:      General: She is not in acute distress.    Appearance: Normal appearance. She is well-developed. She is obese. She is not ill-appearing, toxic-appearing or diaphoretic.     Interventions: Face mask in place.  HENT:     Head: Normocephalic and atraumatic.     Right Ear: External ear normal.     Left Ear: External ear normal.  Eyes:     General: Lids are normal. No scleral icterus.       Right eye: No discharge.        Left eye: No discharge.     Conjunctiva/sclera: Conjunctivae normal.  Neck:     Trachea: Phonation normal. No tracheal deviation.  Cardiovascular:     Rate and Rhythm: Normal rate and regular rhythm.     Pulses: Normal pulses.          Radial pulses are 2+ on the right side and 2+ on the left side.       Posterior tibial pulses are 2+ on the right side and 2+ on the left side.     Heart sounds: Normal heart sounds. No murmur heard.   No friction rub. No gallop.  Pulmonary:     Effort: Pulmonary effort is normal. No respiratory distress.     Breath sounds: Normal breath sounds. No  stridor. No wheezing, rhonchi or rales.  Chest:     Chest wall: No tenderness.  Abdominal:     General: Bowel sounds are normal. There is no distension.     Palpations: Abdomen is soft.  Musculoskeletal:     Right lower leg: No edema.     Left lower leg: No edema.  Skin:    General: Skin is warm and dry.     Coloration: Skin is not jaundiced or pale.     Findings: No rash.  Neurological:     Mental Status: She is alert.     Motor: No abnormal muscle tone.     Gait: Gait normal.  Psychiatric:        Attention and Perception: She is inattentive.        Mood and Affect: Mood and affect normal.        Speech: Speech normal.        Behavior: Behavior is hyperactive. Behavior is cooperative.        Assessment & Plan:     ICD-10-CM   1. Mixed hyperlipidemia  99991111 COMPLETE METABOLIC PANEL WITH GFR    Lipid panel   last labs not well controlled, poor med compliance, pt has been back on meds, recheck labs    2. Moderate episode of recurrent major depressive disorder (HCC)  F33.1 TSH   increase lexapro - sick family member - pt wants to make sure mood stays good - lexapro 15     3. Anxiety  F41.9 TSH   see #2 gad 7 reviewed today    4. Fibromyalgia  M79.7 baclofen (LIORESAL) 10 MG tablet    gabapentin (NEURONTIN) 100 MG capsule    Ambulatory referral to Physical Medicine Rehab  improved with more activity, diet/lifestyle changes    5. Essential hypertension, benign  I10 COMPLETE METABOLIC PANEL WITH GFR   stable well controlled, BP at goal - see below    6. Need for influenza vaccination  Z23 Flu Vaccine QUAD 6+ mos PF IM (Fluarix Quad PF)    7. Pulmonary emphysema, unspecified emphysema type (HCC) Chronic J43.9 CBC with Differential/Platelet    COMPLETE METABOLIC PANEL WITH GFR   sx well controlled    8. Morbid obesity (HCC) Chronic E66.01 CBC with Differential/Platelet    COMPLETE METABOLIC PANEL WITH GFR    Lipid panel    9. Bradycardia  R00.1 COMPLETE METABOLIC  PANEL WITH GFR    TSH   not taking metoprolol every day, will wean off, BP is at goal, want to avoid bradycardia, will follow both at upcoming visit in ~3 weeks    10. Encounter for medication monitoring  Z51.81 CBC with Differential/Platelet    COMPLETE METABOLIC PANEL WITH GFR    Lipid panel    TSH    11. Screening for malignant neoplasm of colon  Z12.11 Fecal Globin By Immunochemistry   she will mail in FIT test/stool card    12. Foraminal stenosis of lumbar region  M48.061 baclofen (LIORESAL) 10 MG tablet    gabapentin (NEURONTIN) 100 MG capsule    Ambulatory referral to Physical Medicine Rehab   physiatry/PM&R referral, changed muscle relaxer (zanaflex did nothing for her) stop lyric and restart gabapentin    13. Chronic low back pain, unspecified back pain laterality, unspecified whether sciatica present  M54.50 baclofen (LIORESAL) 10 MG tablet   G89.29 gabapentin (NEURONTIN) 100 MG capsule    Ambulatory referral to Physical Medicine Rehab    14. Situational stress  F43.9 escitalopram (LEXAPRO) 10 MG tablet   multiple stressors in home, family, financial - continue lexapro - increase dose to 15    15. Irritable bowel syndrome, unspecified type  K58.9    much better with PRN bentyl         Multiple med problems - hx a little difficult to understand today  Lyrica she states made her confused/forgetful - wants to d/c previously she stated helped a lot with chronic pain - d/c lyrica, she reports previously tolerating gabapentin - discussed other options - NSAIDs, topical NSAIDS, muscle relaxers, cymbalta and PT/of PM&R she feels overall fibromyalgia pain is better but joint and back pain is worse  HTN well controlled, no palpitations past rhythm issues and recent ER visit for bradycardia - poor compliance =- stop BB  Due for labs - refused today and she would rather do at her physical at the end of the month     Danelle Berry, PA-C 06/21/21 2:32 PM

## 2021-07-12 ENCOUNTER — Telehealth: Payer: Self-pay | Admitting: Family Medicine

## 2021-07-12 NOTE — Telephone Encounter (Signed)
Copied from CRM [id number]. Topic: Medicare AWV >> Jul 12, 2021  3:27 PM Claudette Laws R wrote: Reason for CRM: No answer unable to leave a message for patient to call back and schedule Medicare Annual Wellness Visit (AWV) in office.   If unable to come into the office for AWV,  please offer to do virtually or by telephone.  Last AWV: 05/11/2020  Please schedule at anytime with [facility] Health Advisor.  40 minute appointment  Any questions, please contact me at [phone]

## 2021-07-19 ENCOUNTER — Other Ambulatory Visit (HOSPITAL_COMMUNITY)
Admission: RE | Admit: 2021-07-19 | Discharge: 2021-07-19 | Disposition: A | Discharge status: Home / Self Care | Payer: Medicare PPO | Source: Ambulatory Visit | Attending: Family Medicine | Admitting: Family Medicine

## 2021-07-19 ENCOUNTER — Other Ambulatory Visit: Payer: Self-pay

## 2021-07-19 ENCOUNTER — Encounter: Payer: Self-pay | Admitting: Family Medicine

## 2021-07-19 ENCOUNTER — Ambulatory Visit (INDEPENDENT_AMBULATORY_CARE_PROVIDER_SITE_OTHER): Payer: Medicare PPO | Admitting: Family Medicine

## 2021-07-19 VITALS — BP 138/78 | HR 80 | Temp 98.0°F | Resp 16 | Ht 62.0 in | Wt 172.8 lb

## 2021-07-19 DIAGNOSIS — Z0000 Encounter for general adult medical examination without abnormal findings: Secondary | ICD-10-CM | POA: Diagnosis not present

## 2021-07-19 DIAGNOSIS — Z1151 Encounter for screening for human papillomavirus (HPV): Secondary | ICD-10-CM | POA: Insufficient documentation

## 2021-07-19 DIAGNOSIS — M797 Fibromyalgia: Secondary | ICD-10-CM

## 2021-07-19 DIAGNOSIS — I10 Essential (primary) hypertension: Secondary | ICD-10-CM | POA: Diagnosis not present

## 2021-07-19 DIAGNOSIS — F419 Anxiety disorder, unspecified: Secondary | ICD-10-CM

## 2021-07-19 DIAGNOSIS — J439 Emphysema, unspecified: Secondary | ICD-10-CM

## 2021-07-19 DIAGNOSIS — Z716 Tobacco abuse counseling: Secondary | ICD-10-CM

## 2021-07-19 DIAGNOSIS — Z122 Encounter for screening for malignant neoplasm of respiratory organs: Secondary | ICD-10-CM

## 2021-07-19 DIAGNOSIS — R001 Bradycardia, unspecified: Secondary | ICD-10-CM

## 2021-07-19 DIAGNOSIS — Z87891 Personal history of nicotine dependence: Secondary | ICD-10-CM

## 2021-07-19 DIAGNOSIS — E782 Mixed hyperlipidemia: Secondary | ICD-10-CM

## 2021-07-19 DIAGNOSIS — F17200 Nicotine dependence, unspecified, uncomplicated: Secondary | ICD-10-CM

## 2021-07-19 DIAGNOSIS — Z1211 Encounter for screening for malignant neoplasm of colon: Secondary | ICD-10-CM

## 2021-07-19 DIAGNOSIS — F331 Major depressive disorder, recurrent, moderate: Secondary | ICD-10-CM

## 2021-07-19 DIAGNOSIS — M2550 Pain in unspecified joint: Secondary | ICD-10-CM

## 2021-07-19 DIAGNOSIS — Z01419 Encounter for gynecological examination (general) (routine) without abnormal findings: Secondary | ICD-10-CM

## 2021-07-19 DIAGNOSIS — Z780 Asymptomatic menopausal state: Secondary | ICD-10-CM

## 2021-07-19 DIAGNOSIS — Z6831 Body mass index (BMI) 31.0-31.9, adult: Secondary | ICD-10-CM

## 2021-07-19 DIAGNOSIS — Z124 Encounter for screening for malignant neoplasm of cervix: Secondary | ICD-10-CM

## 2021-07-19 DIAGNOSIS — E669 Obesity, unspecified: Secondary | ICD-10-CM

## 2021-07-19 MED ORDER — ATORVASTATIN CALCIUM 80 MG PO TABS: 80.0000 mg | ORAL_TABLET | Freq: Every day | ORAL | 3 refills | Status: DC

## 2021-07-19 MED ORDER — METOPROLOL SUCCINATE ER 25 MG PO TB24: 12.5000 mg | ORAL_TABLET | Freq: Every day | ORAL | 3 refills | Status: DC

## 2021-07-19 MED ORDER — MELOXICAM 15 MG PO TABS: 15.0000 mg | ORAL_TABLET | Freq: Every day | ORAL | 1 refills | Status: DC | PRN

## 2021-07-19 NOTE — Patient Instructions (Addendum)
Health Maintenance  Topic Date Due   Colon Cancer Screening  Never done   COVID-19 Vaccine (4 - Booster for Moderna series) 12/28/2020   Mammogram  01/22/2021   Pap Smear  04/29/2021   Tetanus Vaccine  09/02/2025   Pneumococcal Vaccination  Completed   Flu Shot  Completed   Hepatitis C Screening: USPSTF Recommendation to screen - Ages 18-64 yo.  Completed   HIV Screening  Completed   Zoster (Shingles) Vaccine  Completed   HPV Vaccine  Aged Out    [place] [facility] - call and schedule mammogram and bone density [place],  [place]  [id number] Get Driving Directions Main: [phone]   Preventive Care 47-58 Years Old, Female Preventive care refers to lifestyle choices and visits with your health care provider that can promote health and wellness. Preventive care visits are also called wellness exams. What can I expect for my preventive care visit? Counseling Your health care provider may ask you questions about your: Medical history, including: Past medical problems. Family medical history. Pregnancy history. Current health, including: Menstrual cycle. Method of birth control. Emotional well-being. Home life and relationship well-being. Sexual activity and sexual health. Lifestyle, including: Alcohol, nicotine or tobacco, and drug use. Access to firearms. Diet, exercise, and sleep habits. Work and work Statistician. Sunscreen use. Safety issues such as seatbelt and bike helmet use. Physical exam Your health care provider will check your: Height and weight. These may be used to calculate your BMI (body mass index). BMI is a measurement that tells if you are at a healthy weight. Waist circumference. This measures the distance around your waistline. This measurement also tells if you are at a healthy weight and may help predict your risk of certain diseases, such as type 2 diabetes and high blood pressure. Heart rate and blood  pressure. Body temperature. Skin for abnormal spots. What immunizations do I need? Vaccines are usually given at various ages, according to a schedule. Your health care provider will recommend vaccines for you based on your age, medical history, and lifestyle or other factors, such as travel or where you work. What tests do I need? Screening Your health care provider may recommend screening tests for certain conditions. This may include: Lipid and cholesterol levels. Diabetes screening. This is done by checking your blood sugar (glucose) after you have not eaten for a while (fasting). Pelvic exam and Pap test. Hepatitis B test. Hepatitis C test. HIV (human immunodeficiency virus) test. STI (sexually transmitted infection) testing, if you are at risk. Lung cancer screening. Colorectal cancer screening. Mammogram. Talk with your health care provider about when you should start having regular mammograms. This may depend on whether you have a family history of breast cancer. BRCA-related cancer screening. This may be done if you have a family history of breast, ovarian, tubal, or peritoneal cancers. Bone density scan. This is done to screen for osteoporosis. Talk with your health care provider about your test results, treatment options, and if necessary, the need for more tests. Follow these instructions at home: Eating and drinking  Eat a diet that includes fresh fruits and vegetables, whole grains, lean protein, and low-fat dairy products. Take vitamin and mineral supplements as recommended by your health care provider. Do not drink alcohol if: Your health care provider tells you not to drink. You are pregnant, may be pregnant, or are planning to become pregnant. If you drink alcohol: Limit how much you have to 0-1 drink a  day. Know how much alcohol is in your drink. In the [place] one drink equals one 12 oz bottle of beer (355 mL), one 5 oz glass of wine (148 mL), or one 1 oz glass of  hard liquor (44 mL). Lifestyle Brush your teeth every morning and night with fluoride toothpaste. Floss one time each day. Exercise for at least 30 minutes 5 or more days each week. Do not use any products that contain nicotine or tobacco. These products include cigarettes, chewing tobacco, and vaping devices, such as e-cigarettes. If you need help quitting, ask your health care provider. Do not use drugs. If you are sexually active, practice safe sex. Use a condom or other form of protection to prevent STIs. If you do not wish to become pregnant, use a form of birth control. If you plan to become pregnant, see your health care provider for a prepregnancy visit. Take aspirin only as told by your health care provider. Make sure that you understand how much to take and what form to take. Work with your health care provider to find out whether it is safe and beneficial for you to take aspirin daily. Find healthy ways to manage stress, such as: Meditation, yoga, or listening to music. Journaling. Talking to a trusted person. Spending time with friends and family. Minimize exposure to UV radiation to reduce your risk of skin cancer. Safety Always wear your seat belt while driving or riding in a vehicle. Do not drive: If you have been drinking alcohol. Do not ride with someone who has been drinking. When you are tired or distracted. While texting. If you have been using any mind-altering substances or drugs. Wear a helmet and other protective equipment during sports activities. If you have firearms in your house, make sure you follow all gun safety procedures. Seek help if you have been physically or sexually abused. What's next? Visit your health care provider once a year for an annual wellness visit. Ask your health care provider how often you should have your eyes and teeth checked. Stay up to date on all vaccines. This information is not intended to replace advice given to you by your  health care provider. Make sure you discuss any questions you have with your health care provider. Document Revised: 02/02/2021 Document Reviewed: 02/02/2021 Elsevier Patient Education  [place].

## 2021-07-19 NOTE — Progress Notes (Addendum)
  Patient: Jenna Meyers, Female    DOB: 10/21/1955, 65 y.o.   MRN: [id number] Tapia, [name], PA-C Visit Date: 07/19/2021  Today's Provider: [name] Tapia, PA-C   Chief Complaint  Patient presents with   Annual Exam   Subjective:   Annual physical exam:  Jenna Meyers is a 65 y.o. female who presents today for complete physical exam:  Exercise/Activity:  not much exercising  Diet/nutrition:   healthy, no salt  Sleep:  fairly well all things considered  SDOH Screenings   Alcohol Screen: Low Risk    Last Alcohol Screening Score (AUDIT): 0  Depression (PHQ2-9): Medium Risk   PHQ-2 Score: 7  Financial Resource Strain: Low Risk    Difficulty of Paying Living Expenses: Not very hard  Food Insecurity: No Food Insecurity   Worried About Running Out of Food in the Last Year: Never true   Ran Out of Food in the Last Year: Never true  Housing: Not on file  Physical Activity: Inactive   Days of Exercise per Week: 0 days   Minutes of Exercise per Session: 0 min  Social Connections: Socially Isolated   Frequency of Communication with Friends and Family: More than three times a week   Frequency of Social Gatherings with Friends and Family: More than three times a week   Attends Religious Services: Never   Active Member of Clubs or Organizations: Patient refused   Attends Club or Organization Meetings: Never   Marital Status: Divorced  Stress: Stress Concern Present   Feeling of Stress : To some extent  Tobacco Use: High Risk   Smoking Tobacco Use: Every Day   Smokeless Tobacco Use: Never   Passive Exposure: Not on file  Transportation Needs: No Transportation Needs   Lack of Transportation (Medical): No   Lack of Transportation (Non-Medical): No      USPSTF grade A and B recommendations - reviewed and addressed today  Depression:  Phq 9 completed today by patient, was reviewed by me with patient in the room PHQ score is positive, pt feels mostly well, change in  appetite - lexapro did not help at all, even increasing dose didn't help - continued stress with family and in home - open to referral to psychiatry and would like therapist PHQ 2/9 Scores 07/19/2021 06/21/2021 04/04/2021 01/31/2021  PHQ - 2 Score 2 1 1 4  PHQ- 9 Score 7 2 2 14   Depression screen PHQ 2/9 07/19/2021 06/21/2021 04/04/2021 01/31/2021 05/25/2020  Decreased Interest 1 0 0 1 1  Down, Depressed, Hopeless 1 1 1 3 1  PHQ - 2 Score 2 1 1 4 2  Altered sleeping 1 0 0 2 1  Tired, decreased energy 1 0 0 1 1  Change in appetite 2 1 1 1 1  Feeling bad or failure about yourself  0 0 0 3 0  Trouble concentrating 1 0 0 1 0  Moving slowly or fidgety/restless 0 0 0 2 0  Suicidal thoughts 0 0 - 0 0  PHQ-9 Score 7 2 2 14 -  Difficult doing work/chores Somewhat difficult Not difficult at all - Somewhat difficult Somewhat difficult  Some recent data might be hidden    Alcohol screening: Flowsheet Row Office Visit from 04/04/2021 in [facility]  AUDIT-C Score 0       Immunizations and Health Maintenance: Health Maintenance  Topic Date Due   COLONOSCOPY (Pts 45-49yrs Insurance coverage will need to be confirmed)  Never done   COVID-19   Vaccine (4 - Booster for Moderna series) 12/28/2020   MAMMOGRAM  01/22/2021   PAP SMEAR-Modifier  04/29/2021   TETANUS/TDAP  09/02/2025   Pneumococcal Vaccine 30-69 Years old  Completed   INFLUENZA VACCINE  Completed   Hepatitis C Screening  Completed   HIV Screening  Completed   Zoster Vaccines- Shingrix  Completed   HPV VACCINES  Aged Out     Hep C Screening: done  STD testing and prevention (HIV/chl/gon/syphilis):  see above, no additional testing desired by pt today  Intimate partner violence: safe at home  Sexual History/Pain during Intercourse: Divorced  Menstrual History/LMP/Abnormal Bleeding: denies No LMP recorded. Patient has had a hysterectomy.  Incontinence Symptoms: denies  Breast cancer:   ordered, overdue  Last  Mammogram: *see HM list above BRCA gene screening:    Cervical cancer screening: done today - partial hysterectomy -  Pt no family hx of cancers - breast, ovarian, uterine, colon:     Osteoporosis:   Discussion on osteoporosis per age, including high calcium and vitamin D supplementation, weight bearing exercises Pt is not supplementing with daily calcium/Vit D. Its been several years, due Bone scan/dexa Roughly experienced menopause at age 28-40 had hysterectomy   Skin cancer:  Hx of skin CA -  NO Discussed atypical lesions   Colorectal cancer:   Colonoscopy is due - FIT test ordered Discussed concerning signs and sx of CRC, pt denies melena hematochezia change in bowels  Lung cancer:  qualifies -30+ pack year history, current everyday smoker Low Dose CT Chest recommended if Age 55-80 years, 20 pack-year currently smoking OR have quit w/in 15years. Patient does qualify.    Social History   Tobacco Use   Smoking status: Every Day    Packs/day: 1.50    Years: 34.00    Pack years: 51.00    Types: Cigarettes   Smokeless tobacco: Never   Tobacco comments:    been given a Rx for Chantix quit for 1 yr and started back  Vaping Use   Vaping Use: Never used  Substance Use Topics   Alcohol use: Yes    Comment: rare   Drug use: No     Flowsheet Row Office Visit from 04/04/2021 in [facility]  AUDIT-C Score 0       Family History  Problem Relation Age of Onset   Cancer Mother        colon   Osteoporosis Mother    Hyperlipidemia Mother    Alzheimer's disease Father    Thyroid disease Son    Heart disease Brother      Blood pressure/Hypertension: BP Readings from Last 3 Encounters:  07/19/21 138/78  06/21/21 110/68  04/18/21 114/68    Weight/Obesity: Wt Readings from Last 3 Encounters:  07/19/21 172 lb 12.8 oz (78.4 kg)  06/21/21 177 lb 9.6 oz (80.6 kg)  04/18/21 178 lb 9.2 oz (81 kg)   BMI Readings from Last 3 Encounters:  07/19/21 31.61  kg/m  06/21/21 32.48 kg/m  04/18/21 32.66 kg/m     Lipids:  Lab Results  Component Value Date   CHOL 215 (H) 01/31/2021   CHOL 174 11/24/2019   CHOL 188 01/10/2019   Lab Results  Component Value Date   HDL 50 01/31/2021   HDL 52 11/24/2019   HDL 44 (L) 01/10/2019   Lab Results  Component Value Date   LDLCALC 136 (H) 01/31/2021   LDLCALC 101 (H) 11/24/2019   LDLCALC 114 (H) 01/10/2019  Lab Results  Component Value Date   TRIG 155 (H) 01/31/2021   TRIG 117 11/24/2019   TRIG 181 (H) 01/10/2019   Lab Results  Component Value Date   CHOLHDL 4.3 01/31/2021   CHOLHDL 3.3 11/24/2019   CHOLHDL 4.3 01/10/2019   No results found for: LDLDIRECT Based on the results of lipid panel his/her cardiovascular risk factor ( using [facility] )  in the next 10 years is: The 10-year ASCVD risk score (Arnett DK, et al., 2019) is: 15.6%   Values used to calculate the score:     Age: 21 years     Sex: Female     Is Non-Hispanic African American: No     Diabetic: No     Tobacco smoker: Yes     Systolic Blood Pressure: 825 mmHg     Is BP treated: Yes     HDL Cholesterol: 50 mg/dL     Total Cholesterol: 215 mg/dL  Glucose:  Glucose  Date Value Ref Range Status  04/17/2014 114 (H) 65 - 99 mg/dL Final  10/24/2013 87 65 - 99 mg/dL Final  10/20/2013 86 65 - 99 mg/dL Final   Glucose, Bld  Date Value Ref Range Status  04/18/2021 85 70 - 99 mg/dL Final    Comment:    Glucose reference range applies only to samples taken after fasting for at least 8 hours.  01/31/2021 80 65 - 99 mg/dL Final    Comment:    .            Fasting reference interval .   05/12/2020 89 65 - 99 mg/dL Final    Comment:    .            Fasting reference interval .     Advanced Care Planning:  A voluntary discussion about advance care planning including the explanation and discussion of advance directives.   Discussed health care proxy and Living will, and the patient was able to identify a health  care proxy as Son - has papers and is going to complete.   Patient does not have a living will at present time.   Social History       Social History   Socioeconomic History   Marital status: Divorced    Spouse name: Not on file   Number of children: 1   Years of education: Not on file   Highest education level: Associate degree: academic program  Occupational History   Occupation: Disabled  Tobacco Use   Smoking status: Every Day    Packs/day: 1.50    Years: 34.00    Pack years: 51.00    Types: Cigarettes   Smokeless tobacco: Never   Tobacco comments:    been given a Rx for Chantix quit for 1 yr and started back  Vaping Use   Vaping Use: Never used  Substance and Sexual Activity   Alcohol use: Yes    Comment: rare   Drug use: No   Sexual activity: Not Currently    Partners: Male  Other Topics Concern   Not on file  Social History Narrative   Pt lives with her son and daughter in law and 2 small grandchildren   Social Determinants of Health   Financial Resource Strain: Low Risk    Difficulty of Paying Living Expenses: Not very hard  Food Insecurity: No Food Insecurity   Worried About Charity fundraiser in the Last Year: Never true   [employer]  in the Last Year: Never true  Transportation Needs: No Transportation Needs   Lack of Transportation (Medical): No   Lack of Transportation (Non-Medical): No  Physical Activity: Inactive   Days of Exercise per Week: 0 days   Minutes of Exercise per Session: 0 min  Stress: Stress Concern Present   Feeling of Stress : To some extent  Social Connections: Socially Isolated   Frequency of Communication with Friends and Family: More than three times a week   Frequency of Social Gatherings with Friends and Family: More than three times a week   Attends Religious Services: Never   Active Member of Clubs or Organizations: Patient refused   Attends Club or Organization Meetings: Never   Marital Status: Divorced    Family  History        Family History  Problem Relation Age of Onset   Cancer Mother        colon   Osteoporosis Mother    Hyperlipidemia Mother    Alzheimer's disease Father    Thyroid disease Son    Heart disease Brother     Patient Active Problem List   Diagnosis Date Noted   Hyperlipidemia 08/12/2019   Vitamin D deficiency disease 08/12/2019   Hallucinations, visual 03/09/2019   Morbid obesity (HCC) 04/14/2018   Moderate episode of recurrent major depressive disorder (HCC) 04/14/2018   Aortic insufficiency 03/19/2018   LVH (left ventricular hypertrophy) 03/19/2018   Grade I diastolic dysfunction 03/19/2018   Mitral regurgitation 03/19/2018   Lumbar canal stenosis 09/14/2017   Emphysema of lung (HCC) 05/31/2017   Palpitations 05/30/2017   Medication monitoring encounter 02/09/2016   Essential hypertension, benign 05/17/2015   Acute allergic reaction 05/17/2015   Situational stress 05/17/2015   Obesity 03/03/2015   Abnormal thyroid blood test 03/03/2015   Tobacco abuse 03/03/2015   Lumbago    Fibromyalgia    Anxiety 10/09/2012    Past Surgical History:  Procedure Laterality Date   ABDOMINAL HYSTERECTOMY  2000   partial, still has cervix   CESAREAN SECTION  1996   LAPAROSCOPIC ABDOMINAL EXPLORATION  1999   TONSILLECTOMY AND ADENOIDECTOMY  1962     Current Outpatient Medications:    atorvastatin (LIPITOR) 80 MG tablet, Take 1 tablet (80 mg total) by mouth at bedtime., Disp: 90 tablet, Rfl: 3   baclofen (LIORESAL) 10 MG tablet, Take 0.5-1 tablets (5-10 mg total) by mouth 3 (three) times daily., Disp: 60 each, Rfl: 1   Cholecalciferol (VITAMIN D3) 125 MCG (5000 UT) CAPS, Take by mouth. Pt takes once a week , Disp: , Rfl:    dicyclomine (BENTYL) 20 MG tablet, Take 1 tablet (20 mg total) by mouth 3 (three) times daily as needed for spasms (abd cramping)., Disp: 60 tablet, Rfl: 2   escitalopram (LEXAPRO) 10 MG tablet, Take 1.5 tablets (15 mg total) by mouth daily., Disp: 135  tablet, Rfl: 1   gabapentin (NEURONTIN) 100 MG capsule, Take 1 capsule (100 mg total) by mouth 3 (three) times daily., Disp: 90 capsule, Rfl: 3   metoprolol succinate (TOPROL-XL) 25 MG 24 hr tablet, TAKE 1 TABLET BY MOUTH EVERY DAY FOR BLOOD PRESSURE, Disp: 90 tablet, Rfl: 3  No Known Allergies  Patient Care Team: Tapia, [name], PA-C as PCP - General (Family Medicine) Khan, Shaukat A, MD as Consulting Physician (Cardiology)   Chart Review: I personally reviewed active problem list, medication list, allergies, family history, social history, health maintenance, notes from last encounter, lab results, imaging with the patient/caregiver today.     Review of Systems  Constitutional: Negative.   HENT: Negative.    Eyes: Negative.   Respiratory: Negative.    Cardiovascular: Negative.   Gastrointestinal: Negative.   Endocrine: Negative.   Genitourinary: Negative.   Musculoskeletal: Negative.   Skin: Negative.   Allergic/Immunologic: Negative.   Neurological: Negative.   Hematological: Negative.   Psychiatric/Behavioral: Negative.    All other systems reviewed and are negative.        Objective:   Vitals:  Vitals:   07/19/21 1332  BP: 138/78  Pulse: 80  Resp: 16  Temp: 98 F (36.7 C)  TempSrc: Oral  SpO2: 97%  Weight: 172 lb 12.8 oz (78.4 kg)  Height: 5' 2" (1.575 m)    Body mass index is 31.61 kg/m.  Physical Exam Vitals and nursing note reviewed. Exam conducted with a chaperone present.  Constitutional:      General: She is not in acute distress.    Appearance: Normal appearance. She is well-developed. She is obese. She is not ill-appearing, toxic-appearing or diaphoretic.     Interventions: Face mask in place.  HENT:     Head: Normocephalic and atraumatic.     Right Ear: External ear normal.     Left Ear: External ear normal.  Eyes:     General: Lids are normal. No scleral icterus.       Right eye: No discharge.        Left eye: No discharge.      Conjunctiva/sclera: Conjunctivae normal.  Neck:     Trachea: Phonation normal. No tracheal deviation.  Cardiovascular:     Rate and Rhythm: Normal rate and regular rhythm.     Pulses: Normal pulses.          Radial pulses are 2+ on the right side and 2+ on the left side.       Posterior tibial pulses are 2+ on the right side and 2+ on the left side.     Heart sounds: Normal heart sounds. No murmur heard.   No friction rub. No gallop.  Pulmonary:     Effort: Pulmonary effort is normal. No respiratory distress.     Breath sounds: Normal breath sounds. No stridor. No wheezing, rhonchi or rales.  Chest:     Chest wall: No tenderness.  Abdominal:     General: Bowel sounds are normal. There is no distension.     Palpations: Abdomen is soft.     Tenderness: There is no abdominal tenderness. There is no right CVA tenderness, left CVA tenderness or guarding.  Genitourinary:    Vagina: Normal.     Cervix: No cervical motion tenderness, friability, erythema, cervical bleeding or eversion.     Uterus: Absent.      Adnexa: Right adnexa normal and left adnexa normal.     Comments: PAP and bimanual completed today Musculoskeletal:     Cervical back: Normal range of motion.     Right lower leg: No edema.     Left lower leg: No edema.  Lymphadenopathy:     Cervical: No cervical adenopathy.  Skin:    General: Skin is warm and dry.     Coloration: Skin is not jaundiced or pale.     Findings: No rash.  Neurological:     Mental Status: She is alert.     Motor: No abnormal muscle tone.     Gait: Gait abnormal.  Psychiatric:        Mood and Affect: Mood normal.  Speech: Speech normal.        Behavior: Behavior normal.      Fall Risk: Fall Risk  07/19/2021 06/21/2021 04/04/2021 01/31/2021 05/25/2020  Falls in the past year? - 0 0 0 0  Number falls in past yr: 0 0 0 0 0  Injury with Fall? 0 0 0 0 0  Risk for fall due to : No Fall Risks No Fall Risks - - -  Risk for fall due to: Comment  - - - - -  Follow up Falls prevention discussed Falls prevention discussed Falls evaluation completed - Falls evaluation completed    Functional Status Survey: Is the patient deaf or have difficulty hearing?: Yes Does the patient have difficulty seeing, even when wearing glasses/contacts?: No Does the patient have difficulty concentrating, remembering, or making decisions?: Yes Does the patient have difficulty walking or climbing stairs?: No Does the patient have difficulty dressing or bathing?: No Does the patient have difficulty doing errands alone such as visiting a doctor's office or shopping?: No   Assessment & Plan:    CPE completed today  USPSTF grade A and B recommendations reviewed with patient; age-appropriate recommendations, preventive care, screening tests, etc discussed and encouraged; healthy living encouraged; see AVS for patient education given to patient  Discussed importance of 150 minutes of physical activity weekly, AHA exercise recommendations given to pt in AVS/handout  Discussed importance of healthy diet:  eating lean meats and proteins, avoiding trans fats and saturated fats, avoid simple sugars and excessive carbs in diet, eat 6 servings of fruit/vegetables daily and drink plenty of water and avoid sweet beverages.    Recommended pt to do annual eye exam and routine dental exams/cleanings  Depression, alcohol, fall screening completed as documented above and per flowsheets  Advance Care planning information and packet discussed and offered today, encouraged pt to discuss with family members/spouse/partner/friends and complete Advanced directive packet and bring copy to office   Reviewed Health Maintenance: Health Maintenance  Topic Date Due   COLONOSCOPY (Pts 45-23yr Insurance coverage will need to be confirmed)  Never done   COVID-19 Vaccine (4 - Booster for Moderna series) 12/28/2020   MAMMOGRAM  01/22/2021   PAP SMEAR-Modifier  04/29/2021    TETANUS/TDAP  09/02/2025   Pneumococcal Vaccine 141633Years old  Completed   INFLUENZA VACCINE  Completed   Hepatitis C Screening  Completed   HIV Screening  Completed   Zoster Vaccines- Shingrix  Completed   HPV VACCINES  Aged Out    Immunizations: Immunization History  Administered Date(s) Administered   Influenza Inj Mdck Quad Pf 06/14/2017   Influenza,inj,Quad PF,6+ Mos 04/04/2018, 05/12/2020, 06/21/2021   Moderna Sars-Covid-2 Vaccination 02/25/2020, 03/24/2020, 11/02/2020   PNEUMOCOCCAL CONJUGATE-20 04/04/2021   Tdap 09/03/2015   Zoster Recombinat (Shingrix) 04/04/2018, 05/12/2020   Zoster, Live 08/21/2013   Vaccines:  Shingrix: 551-64yo and ask insurance if covered when patient above 656yo Pneumonia:  educated and discussed with patient. Flu: done educated and discussed with patient. COVID:   encouraged booster    ICD-10-CM   1. Well woman exam  Z01.419    CPE and well woman done today, to get dexa and mammogram scheduled    2. Mixed hyperlipidemia  E78.2 atorvastatin (LIPITOR) 80 MG tablet   last lipids elevated     3. Essential hypertension, benign  I10 metoprolol succinate (TOPROL-XL) 25 MG 24 hr tablet   BP higher than last OV, decrease BB dose for se bradycardia, continue other meds, DASH and  monitor    4. Anxiety  F41.9 Ambulatory referral to Psychiatry   refer to psychiatry and therapy will help as well    5. Fibromyalgia  M79.7 meloxicam (MOBIC) 15 MG tablet   sx not well controlled    6. Class 1 obesity with serious comorbidity and body mass index (BMI) of 31.0 to 31.9 in adult, unspecified obesity type  E66.9    Z68.31    bmi improved, no longer morbidly obese, but continues to have HLD, HTN, OA, COPD     7. Moderate episode of recurrent major depressive disorder (HCC)  F33.1    phq positive, despite changing meds and increasing dose - refer to psych    8. Pulmonary emphysema, unspecified emphysema type (HCC)  J43.9     9. Screening for cervical  cancer  Z12.4 Cytology - PAP   PAP and HPV testing completed today, may age out if neg, no high risk behavior    10. Screening for colon cancer  Z12.11 Ambulatory referral to Gastroenterology   no sx, mail in fit test, then f/up in 1 year    11. Postmenopausal estrogen deficiency  Z78.0 DG Bone Density    12. Polyarthralgia  M25.50 meloxicam (MOBIC) 15 MG tablet   pain worse     13. Bradycardia  R00.1 metoprolol succinate (TOPROL-XL) 25 MG 24 hr tablet   reduce BB dose    14. Encounter for screening for malignant neoplasm of lung in patient with less than 30 pack year smoking history  Z12.2 Ambulatory Referral for Lung Cancer Scre   Z87.891    more than 30 pack years hx, currently smoking, refer to  lung CA screening program    15. Encounter for smoking cessation counseling  Z71.6 Ambulatory Referral for Lung Cancer Scre   see below    16. Current smoker  F17.200 Ambulatory Referral for Lung Cancer Scre     Anxiety/stress/MDD - refer to psych, phq 9 and GAD 7 reviewed Previously referred to CCM SW, meds changed, encouraged self care, coping skills, etc, with multiple tx and meds tried and failed - see above refer to psych  HM reviewed with pt and updated: Health Maintenance  Topic Date Due   DEXA scan (bone density measurement)  Never done   Mammogram  01/22/2021   Colon Cancer Screening  07/19/2022*   Pap Smear  07/19/2024   Tetanus Vaccine  09/02/2025   Pneumococcal Vaccination  Completed   Flu Shot  Completed   Hepatitis C Screening: USPSTF Recommendation to screen - Ages 18-79 yo.  Completed   HIV Screening  Completed   Zoster (Shingles) Vaccine  Completed   HPV Vaccine  Aged Out   COVID-19 Vaccine  Discontinued  *Topic was postponed. The date shown is not the original due date.  Pt to schedule dexa and mammo - all others done, discussed updated   Smoking cessation instruction/counseling given:  counseled patient on the dangers of tobacco use, advised patient to stop  smoking, and reviewed strategies to maximize success more than 5 min today spent discussing smoking hx, risk, ways to quit, lung CA screening program - referred.     [name] Tapia, PA-C 07/19/21 2:02 PM  [facility]  

## 2021-07-20 ENCOUNTER — Other Ambulatory Visit: Payer: Self-pay

## 2021-07-20 DIAGNOSIS — I10 Essential (primary) hypertension: Secondary | ICD-10-CM

## 2021-07-20 DIAGNOSIS — R001 Bradycardia, unspecified: Secondary | ICD-10-CM

## 2021-07-20 LAB — CBC WITH DIFFERENTIAL/PLATELET
Absolute Monocytes: 501 cells/uL (ref 200–950)
Basophils Absolute: 73 cells/uL (ref 0–200)
Basophils Relative: 0.8 %
Eosinophils Absolute: 100 cells/uL (ref 15–500)
Eosinophils Relative: 1.1 %
HCT: 39.7 % (ref 35.0–45.0)
Hemoglobin: 13.4 g/dL (ref 11.7–15.5)
Lymphs Abs: 2648 cells/uL (ref 850–3900)
MCH: 30.6 pg (ref 27.0–33.0)
MCHC: 33.8 g/dL (ref 32.0–36.0)
MCV: 90.6 fL (ref 80.0–100.0)
MPV: 11.5 fL (ref 7.5–12.5)
Monocytes Relative: 5.5 %
Neutro Abs: 5779 cells/uL (ref 1500–7800)
Neutrophils Relative %: 63.5 %
Platelets: 308 10*3/uL (ref 140–400)
RBC: 4.38 10*6/uL (ref 3.80–5.10)
RDW: 12.8 % (ref 11.0–15.0)
Total Lymphocyte: 29.1 %
WBC: 9.1 10*3/uL (ref 3.8–10.8)

## 2021-07-20 LAB — COMPLETE METABOLIC PANEL WITH GFR
AG Ratio: 2 (calc) (ref 1.0–2.5)
ALT: 10 U/L (ref 6–29)
AST: 15 U/L (ref 10–35)
Albumin: 4.7 g/dL (ref 3.6–5.1)
Alkaline phosphatase (APISO): 64 U/L (ref 37–153)
BUN: 15 mg/dL (ref 7–25)
CO2: 21 mmol/L (ref 20–32)
Calcium: 9.6 mg/dL (ref 8.6–10.4)
Chloride: 108 mmol/L (ref 98–110)
Creat: 0.83 mg/dL (ref 0.50–1.05)
Globulin: 2.3 g/dL (calc) (ref 1.9–3.7)
Glucose, Bld: 89 mg/dL (ref 65–99)
Potassium: 4.2 mmol/L (ref 3.5–5.3)
Sodium: 140 mmol/L (ref 135–146)
Total Bilirubin: 0.5 mg/dL (ref 0.2–1.2)
Total Protein: 7 g/dL (ref 6.1–8.1)
eGFR: 79 mL/min/{1.73_m2} (ref >=60)

## 2021-07-20 LAB — LIPID PANEL
Cholesterol: 173 mg/dL (ref <200)
HDL: 55 mg/dL (ref >=50)
LDL Cholesterol (Calc): 97 mg/dL (calc)
Non-HDL Cholesterol (Calc): 118 mg/dL (calc) (ref <130)
Total CHOL/HDL Ratio: 3.1 (calc) (ref <5.0)
Triglycerides: 111 mg/dL (ref <150)

## 2021-07-20 LAB — TSH: TSH: 4.66 mIU/L — ABNORMAL HIGH (ref 0.40–4.50)

## 2021-07-21 LAB — CYTOLOGY - PAP
Comment: NEGATIVE
Diagnosis: NEGATIVE
High risk HPV: NEGATIVE

## 2021-08-26 ENCOUNTER — Telehealth: Payer: Self-pay | Admitting: Family Medicine

## 2021-08-26 ENCOUNTER — Ambulatory Visit: Payer: Self-pay

## 2021-08-26 NOTE — Telephone Encounter (Signed)
3rd attempt to reach patient-  No answer- message: call can not be completed at this time Left message on cell number to call office

## 2021-08-26 NOTE — Telephone Encounter (Signed)
Pt called at [phone] which is number she called in from to Agent, left VM to call back to discuss medication with a nurse.

## 2021-08-26 NOTE — Telephone Encounter (Signed)
Pt called to let [name] know that the antidepressant that was prescribed is having an effect on her memory and she wanted to know if their was another option without the side effect/ maybe a different classification / pt has also tried Lexapro that caused issues with her memory as well / please advise   Pt also advised that she made the PT appt, the mammogram appt and she is making the appt for her colonoscopy but doesn't know if her gastro referral is still active / pt will call  Gi to check on referral status

## 2021-08-26 NOTE — Telephone Encounter (Signed)
Can you please schedule her for an appointment

## 2021-08-26 NOTE — Telephone Encounter (Signed)
Pt called, call message says dialer is not in service. Attempted call x 2. No other number available to reach out. Will attempt again later.   Summary: medication side effect(memory loss)   Pt called to let [name] know that the antidepressant that was prescribed is having an effect on her memory and she wanted to know if their was another option without the side effect/ maybe a different classification / pt has also tried Lexapro that caused issues with her memory as well / please advise

## 2021-08-29 NOTE — Telephone Encounter (Signed)
Lvm to get pt scheduled for an appt do discuss meds

## 2021-09-01 ENCOUNTER — Telehealth: Payer: Self-pay

## 2021-09-01 NOTE — Telephone Encounter (Signed)
Copied from CRM [id number]. Topic: Referral - Request for Referral >> Sep 01, 2021  1:44 PM Fields, [facility] R wrote: Has patient seen PCP for this complaint? Yes.   *If NO, is insurance requiring patient see PCP for this issue before PCP can refer them? Referral for which specialty: physical therapist  Preferred provider/office:  Physical Therapy and Orthopedic [facility] at [facility] Reason for referral: discussed with pcp

## 2021-09-01 NOTE — Telephone Encounter (Signed)
Called patient and left message for her to call office to discuss referral. I see one in chart from 11/22.

## 2021-09-12 ENCOUNTER — Ambulatory Visit: Payer: Medicare PPO | Admitting: Nurse Practitioner

## 2021-09-19 ENCOUNTER — Ambulatory Visit: Payer: Medicare PPO | Admitting: Physician Assistant

## 2021-09-26 ENCOUNTER — Telehealth: Payer: Self-pay | Admitting: Family Medicine

## 2021-09-26 ENCOUNTER — Encounter: Payer: Self-pay | Admitting: Physician Assistant

## 2021-09-26 ENCOUNTER — Ambulatory Visit: Payer: Medicare PPO | Admitting: Physician Assistant

## 2021-09-26 VITALS — BP 116/76 | HR 78 | Temp 98.1°F | Resp 18 | Ht 64.0 in | Wt 168.1 lb

## 2021-09-26 DIAGNOSIS — F330 Major depressive disorder, recurrent, mild: Secondary | ICD-10-CM | POA: Diagnosis not present

## 2021-09-26 DIAGNOSIS — M797 Fibromyalgia: Secondary | ICD-10-CM

## 2021-09-26 DIAGNOSIS — M48061 Spinal stenosis, lumbar region without neurogenic claudication: Secondary | ICD-10-CM

## 2021-09-26 DIAGNOSIS — Z87891 Personal history of nicotine dependence: Secondary | ICD-10-CM | POA: Diagnosis not present

## 2021-09-26 DIAGNOSIS — J439 Emphysema, unspecified: Secondary | ICD-10-CM | POA: Diagnosis not present

## 2021-09-26 DIAGNOSIS — F419 Anxiety disorder, unspecified: Secondary | ICD-10-CM | POA: Diagnosis not present

## 2021-09-26 DIAGNOSIS — M545 Low back pain, unspecified: Secondary | ICD-10-CM

## 2021-09-26 DIAGNOSIS — R7989 Other specified abnormal findings of blood chemistry: Secondary | ICD-10-CM

## 2021-09-26 DIAGNOSIS — G8929 Other chronic pain: Secondary | ICD-10-CM

## 2021-09-26 MED ORDER — SERTRALINE HCL 50 MG PO TABS: 50.0000 mg | ORAL_TABLET | Freq: Every day | ORAL | 3 refills | Status: DC

## 2021-09-26 MED ORDER — GABAPENTIN 100 MG PO CAPS: 100.0000 mg | ORAL_CAPSULE | Freq: Three times a day (TID) | ORAL | 3 refills | Status: DC

## 2021-09-26 MED ORDER — ALBUTEROL SULFATE HFA 108 (90 BASE) MCG/ACT IN AERS: 2.0000 | INHALATION_SPRAY | Freq: Four times a day (QID) | RESPIRATORY_TRACT | 0 refills | Status: DC | PRN

## 2021-09-26 NOTE — Progress Notes (Signed)
Established Patient Office Visit  Subjective:  Patient ID: Jenna Meyers, female    DOB: Sep 17, 1955  Age: 65 y.o. MRN: [id number]  Today's Provider: Talitha Givens, MHS, PA-C Introduced myself to the patient as a PA-C and provided education on APPs in clinical practice.    CC:  Chief Complaint  Patient presents with   Medication Management    HPI Jenna Meyers presents for medication management Patient states she has changed her diet and has stopped taking Atorvastatin, Metoprolol.  She states she has stopped taking Lexapro and Lyrica due to side effects- states she was having difficulty concentrating, remembering things, was repeating herself multiple times  States she has a bit of family and home life stressors that are contributing to depressed/anxious mood  She has taken Cymbalta in the past - did not have an issue with it  She states she has intermittent sleep issues but Melatonin is beneficial for this  Reports her anxiety is more of a concern at this time.   She is concerned for early dementia as her father had it and she is noticing memory lapses at home    Past Medical History:  Diagnosis Date   Anxiety 10/09/2012   Aortic insufficiency 03/19/2018   ECHO July 2019   Arthritis    back   Depression    Emphysema lung ([place])    Emphysema of lung ([place])    Fibromyalgia    Grade I diastolic dysfunction 9/44/9675   ECHO July 2019   Hyperlipidemia    Hypertension    IBS (irritable bowel syndrome)    Loss of consciousness ([place]) 03/09/2019   Lumbago    Lumbar canal stenosis 09/14/2017   Moderate to severe, MRI Nov 2018   LVH (left ventricular hypertrophy) 03/19/2018   ECHO July 2019   Mitral regurgitation 03/19/2018   ECHO July 2019   Tobacco abuse    Vitamin D deficiency disease     Past Surgical History:  Procedure Laterality Date   ABDOMINAL HYSTERECTOMY  2000   partial, still has cervix   [place]    Family History  Problem Relation Age of Onset   Cancer Mother        colon   Osteoporosis Mother    Hyperlipidemia Mother    Alzheimer's disease Father    Thyroid disease Son    Heart disease Brother     Social History   Socioeconomic History   Marital status: Divorced    Spouse name: Not on file   Number of children: 1   Years of education: Not on file   Highest education level: Associate degree: academic program  Occupational History   Occupation: Disabled  Tobacco Use   Smoking status: Former    Packs/day: 1.50    Years: 34.00    Pack years: 51.00    Types: Cigarettes    Quit date: 2020    Years since quitting: 3.1   Smokeless tobacco: Never   Tobacco comments:    been given a Rx for Chantix quit for 1 yr and started back  Vaping Use   Vaping Use: Never used  Substance and Sexual Activity   Alcohol use: Yes    Comment: rare   Drug use: No   Sexual activity: Not Currently    Partners: Male  Other Topics Concern   Not on file  Social History Narrative   Pt lives  with her son and daughter in law and 2 small grandchildren   Social Determinants of Health   Financial Resource Strain: Low Risk    Difficulty of Paying Living Expenses: Not very hard  Food Insecurity: No Food Insecurity   Worried About Charity fundraiser in the Last Year: Never true   Arboriculturist in the Last Year: Never true  Transportation Needs: No Transportation Needs   Lack of Transportation (Medical): No   Lack of Transportation (Non-Medical): No  Physical Activity: Inactive   Days of Exercise per Week: 0 days   Minutes of Exercise per Session: 0 min  Stress: Stress Concern Present   Feeling of Stress : To some extent  Social Connections: Socially Isolated   Frequency of Communication with Friends and Family: More than three times a week   Frequency of Social Gatherings with Friends and Family: More than three times a week   Attends  Religious Services: Never   Printmaker: Patient refused   Attends Archivist Meetings: Never   Marital Status: Divorced  Human resources officer Violence: Not At Risk   Fear of Current or Ex-Partner: No   Emotionally Abused: No   Physically Abused: No   Sexually Abused: No    Outpatient Medications Prior to Visit  Medication Sig Dispense Refill   Cholecalciferol (VITAMIN D3) 125 MCG (5000 UT) CAPS Take by mouth. Pt takes once a week      dicyclomine (BENTYL) 20 MG tablet Take 1 tablet (20 mg total) by mouth 3 (three) times daily as needed for spasms (abd cramping). 60 tablet 2   gabapentin (NEURONTIN) 100 MG capsule Take 1 capsule (100 mg total) by mouth 3 (three) times daily. 90 capsule 3   atorvastatin (LIPITOR) 80 MG tablet Take 1 tablet (80 mg total) by mouth at bedtime. (Patient not taking: Reported on 09/26/2021) 90 tablet 3   baclofen (LIORESAL) 10 MG tablet Take 0.5-1 tablets (5-10 mg total) by mouth 3 (three) times daily. (Patient not taking: Reported on 09/26/2021) 60 each 1   escitalopram (LEXAPRO) 10 MG tablet Take 1.5 tablets (15 mg total) by mouth daily. (Patient not taking: Reported on 09/26/2021) 135 tablet 1   meloxicam (MOBIC) 15 MG tablet Take 1 tablet (15 mg total) by mouth daily as needed for pain. (Patient not taking: Reported on 09/26/2021) 90 tablet 1   metoprolol succinate (TOPROL-XL) 25 MG 24 hr tablet Take 0.5 tablets (12.5 mg total) by mouth daily. TAKE 1 TABLET BY MOUTH EVERY DAY FOR BLOOD PRESSURE (Patient not taking: Reported on 09/26/2021) 45 tablet 3   No facility-administered medications prior to visit.    No Known Allergies  ROS Review of Systems  Constitutional:  Positive for fatigue. Negative for fever and unexpected weight change.  Cardiovascular:  Negative for chest pain, palpitations and leg swelling.  Gastrointestinal:  Negative for diarrhea, nausea and vomiting.  Neurological:  Negative for dizziness, tremors, weakness,  light-headedness, numbness and headaches.  Psychiatric/Behavioral:  Positive for dysphoric mood and sleep disturbance. Negative for agitation, confusion, hallucinations, self-injury and suicidal ideas. The patient is nervous/anxious.        Depression screen [facility] 2/9 09/26/2021 07/19/2021 06/21/2021 04/04/2021 01/31/2021  Decreased Interest 1 1 0 0 1  Down, Depressed, Hopeless 2 1 1 1 3   PHQ - 2 Score 3 2 1 1 4   Altered sleeping 1 1 0 0 2  Tired, decreased energy 1 1 0 0 1  Change in  appetite 1 2 1 1 1   Feeling bad or failure about yourself  1 0 0 0 3  Trouble concentrating 1 1 0 0 1  Moving slowly or fidgety/restless 0 0 0 0 2  Suicidal thoughts 0 0 0 - 0  PHQ-9 Score 8 7 2 2 14   Difficult doing work/chores Somewhat difficult Somewhat difficult Not difficult at all - Somewhat difficult  Some recent data might be hidden   GAD 7 : Generalized Anxiety Score 09/26/2021 07/19/2021 06/21/2021 04/04/2021  Nervous, Anxious, on Edge 0 1 0 1  Control/stop worrying 1 1 1  0  Worry too much - different things 2 1 0 1  Trouble relaxing 1 1 0 0  Restless 0 0 0 0  Easily annoyed or irritable 2 2 1 1   Afraid - awful might happen 0 0 0 0  Total GAD 7 Score 6 6 2 3   Anxiety Difficulty Somewhat difficult Somewhat difficult Somewhat difficult Not difficult at all       Objective:    Physical Exam Constitutional:      General: She is awake.     Appearance: Normal appearance. She is well-developed, well-groomed and overweight.  Neck:     Thyroid: No thyroid mass, thyromegaly or thyroid tenderness.  Cardiovascular:     Rate and Rhythm: Normal rate and regular rhythm.     Pulses: Normal pulses.     Heart sounds: Normal heart sounds.  Pulmonary:     Effort: Pulmonary effort is normal.     Breath sounds: Normal breath sounds and air entry. No decreased air movement or transmitted upper airway sounds. No decreased breath sounds, wheezing, rhonchi or rales.  Musculoskeletal:     Cervical back: Normal  range of motion and neck supple.  Lymphadenopathy:     Head:     Right side of head: No submental or submandibular adenopathy.     Left side of head: No submental or submandibular adenopathy.     Upper Body:     Right upper body: No supraclavicular adenopathy.     Left upper body: No supraclavicular adenopathy.  Neurological:     Mental Status: She is alert.  Psychiatric:        Attention and Perception: Attention normal.        Mood and Affect: Mood normal.        Behavior: Behavior normal. Behavior is cooperative.        Cognition and Memory: Cognition normal.    BP 116/76 (BP Location: Right Arm, Patient Position: Sitting, Cuff Size: Normal)    Pulse 78    Temp 98.1 F (36.7 C) (Oral)    Resp 18    Ht 5' 4"  (1.626 m) Comment: per patient   Wt 168 lb 1.6 oz (76.2 kg)    SpO2 98%    BMI 28.85 kg/m  Wt Readings from Last 3 Encounters:  09/26/21 168 lb 1.6 oz (76.2 kg)  07/19/21 172 lb 12.8 oz (78.4 kg)  06/21/21 177 lb 9.6 oz (80.6 kg)     Health Maintenance Due  Topic Date Due   DEXA SCAN  Never done   MAMMOGRAM  01/22/2021    There are no preventive care reminders to display for this patient.  Lab Results  Component Value Date   TSH 4.66 (H) 07/19/2021   Lab Results  Component Value Date   WBC 9.1 07/19/2021   HGB 13.4 07/19/2021   HCT 39.7 07/19/2021   MCV 90.6 07/19/2021  PLT 308 07/19/2021   Lab Results  Component Value Date   NA 140 07/19/2021   K 4.2 07/19/2021   CO2 21 07/19/2021   GLUCOSE 89 07/19/2021   BUN 15 07/19/2021   CREATININE 0.83 07/19/2021   BILITOT 0.5 07/19/2021   ALKPHOS 56 06/28/2016   AST 15 07/19/2021   ALT 10 07/19/2021   PROT 7.0 07/19/2021   ALBUMIN 4.1 06/28/2016   CALCIUM 9.6 07/19/2021   ANIONGAP 7 04/18/2021   EGFR 79 07/19/2021   Lab Results  Component Value Date   CHOL 173 07/19/2021   Lab Results  Component Value Date   HDL 55 07/19/2021   Lab Results  Component Value Date   LDLCALC 97 07/19/2021   Lab  Results  Component Value Date   TRIG 111 07/19/2021   Lab Results  Component Value Date   CHOLHDL 3.1 07/19/2021   Lab Results  Component Value Date   HGBA1C 5.5 01/31/2021      Assessment & Plan:   Problem List Items Addressed This Visit       Respiratory   Emphysema of lung ([place])   Relevant Medications   albuterol (VENTOLIN HFA) 108 (90 Base) MCG/ACT inhaler     Other   Lumbago   Relevant Medications   gabapentin (NEURONTIN) 100 MG capsule   Other Relevant Orders   Ambulatory referral to Physical Therapy   Fibromyalgia   Relevant Medications   gabapentin (NEURONTIN) 100 MG capsule   sertraline (ZOLOFT) 50 MG tablet   Other Relevant Orders   Ambulatory referral to Physical Therapy   Abnormal thyroid blood test    Recurrent, historic concern TSH was elevated in Nov but did not appear to have been addressed  Recommend repeat lab work with thyroid panel to assess for hypothyroidism Follow up in 8 weeks to discuss        History of tobacco abuse   Relevant Orders   Ambulatory Referral Lung Cancer Screening [facility]   Anxiety - Primary    Acute, historic concern with new exacerbation GAD7 at 6 today with some difficulty performing daily tasks RX for Sertraline 50 mg PO QD provided today with goal of reducing anxiety and depression symptoms Follow up in 8 weeks        Relevant Medications   sertraline (ZOLOFT) 50 MG tablet   Mild episode of recurrent major depressive disorder (HCC)    Acute, historic concern, new exacerbation PHQ9 results at 8 with some difficulty performing daily work/ chores Recommend trying Sertraline to assist with depression and anxiety  RX Sertraline 35m PO QD  Follow up in 8 weeks recommended to assess for response Recommend MMSE at follow up as pt is concerned for memory issues       Relevant Medications   sertraline (ZOLOFT) 50 MG tablet   Other Visit Diagnoses     Foraminal stenosis of lumbar region        Relevant Medications   gabapentin (NEURONTIN) 100 MG capsule   Other Relevant Orders   Ambulatory referral to Physical Therapy        Return in about 8 weeks (around 11/21/2021) for For Depression/ anxiety and labs.   I, Bluma Buresh E Adelle Zachar, PA-C, have reviewed all documentation for this visit. The documentation on 09/26/21 for the exam, diagnosis, procedures, and orders are all accurate and complete.   Daejon Lich, [facility]

## 2021-09-26 NOTE — Assessment & Plan Note (Signed)
Acute, historic concern with new exacerbation GAD7 at 6 today with some difficulty performing daily tasks RX for Sertraline 50 mg PO QD provided today with goal of reducing anxiety and depression symptoms Follow up in 8 weeks

## 2021-09-26 NOTE — Assessment & Plan Note (Signed)
Acute, historic concern, new exacerbation PHQ9 results at 8 with some difficulty performing daily work/ chores Recommend trying Sertraline to assist with depression and anxiety  RX Sertraline 50mg  PO QD  Follow up in 8 weeks recommended to assess for response Recommend MMSE at follow up as pt is concerned for memory issues

## 2021-09-26 NOTE — Telephone Encounter (Signed)
Copied from CRM [id number]. Topic: Medicare AWV >> Sep 26, 2021 11:06 AM Claudette Laws R wrote: Reason for CRM:  Left message for patient to call back and schedule Medicare Annual Wellness Visit (AWV) in office.   If unable to come into the office for AWV,  please offer to do virtually or by telephone.  Last AWV: 05/11/2020  Please schedule at anytime with [facility] Health Advisor.  40 minute appointment  Any questions, please contact me at [phone]

## 2021-09-26 NOTE — Assessment & Plan Note (Signed)
Recurrent, historic concern TSH was elevated in Nov but did not appear to have been addressed  Recommend repeat lab work with thyroid panel to assess for hypothyroidism Follow up in 8 weeks to discuss

## 2021-09-29 ENCOUNTER — Other Ambulatory Visit: Payer: Self-pay

## 2021-09-29 DIAGNOSIS — Z87891 Personal history of nicotine dependence: Secondary | ICD-10-CM

## 2021-09-29 DIAGNOSIS — F17210 Nicotine dependence, cigarettes, uncomplicated: Secondary | ICD-10-CM

## 2021-10-12 ENCOUNTER — Inpatient Hospital Stay: Admission: RE | Admit: 2021-10-12 | Payer: Medicare PPO | Source: Ambulatory Visit

## 2021-10-12 ENCOUNTER — Other Ambulatory Visit: Payer: Medicare PPO

## 2021-10-13 ENCOUNTER — Ambulatory Visit: Payer: Medicare PPO | Attending: Physician Assistant | Admitting: Physical Therapy

## 2021-10-14 ENCOUNTER — Ambulatory Visit: Payer: Self-pay | Admitting: *Deleted

## 2021-10-14 NOTE — Telephone Encounter (Signed)
Second attempt to contact patient- left message to call office 

## 2021-10-14 NOTE — Telephone Encounter (Signed)
Third attempt to reach patient- left message to call office. 

## 2021-10-14 NOTE — Telephone Encounter (Signed)
Summary: med ?   Pat called in states med, dicyclomine (BENTYL) 20 MG tablet  isnt really helping her IBS, she feels like she has to double up even though she is watching her diet and  also irritates her stomach. Please all back with further alternatives     Attempted to call patient to discuss medication response/symptoms- left message to call office.

## 2021-10-17 NOTE — Telephone Encounter (Signed)
Lvm for pt to schedule an appt  

## 2021-10-17 NOTE — Telephone Encounter (Signed)
Can you please get her scheduled? 

## 2021-10-18 ENCOUNTER — Ambulatory Visit: Payer: Medicare PPO | Admitting: Physical Therapy

## 2021-10-18 ENCOUNTER — Ambulatory Visit: Admission: RE | Admit: 2021-10-18 | Payer: Medicare PPO | Source: Ambulatory Visit

## 2021-10-20 ENCOUNTER — Encounter: Payer: Medicare PPO | Admitting: Physical Therapy

## 2021-10-25 ENCOUNTER — Encounter: Payer: Medicare PPO | Admitting: Physical Therapy

## 2021-10-27 ENCOUNTER — Encounter: Payer: Medicare PPO | Admitting: Physical Therapy

## 2021-11-01 ENCOUNTER — Encounter: Payer: Medicare PPO | Admitting: Physical Therapy

## 2021-11-03 ENCOUNTER — Encounter: Payer: Medicare PPO | Admitting: Physical Therapy

## 2021-11-08 ENCOUNTER — Encounter: Payer: Medicare PPO | Admitting: Physical Therapy

## 2021-11-10 ENCOUNTER — Encounter: Payer: Medicare PPO | Admitting: Physical Therapy

## 2021-11-14 ENCOUNTER — Telehealth: Payer: Self-pay | Admitting: Family Medicine

## 2021-11-14 ENCOUNTER — Telehealth: Payer: Self-pay | Admitting: *Deleted

## 2021-11-14 NOTE — Telephone Encounter (Signed)
Left message for pt to call back to reschedule f/u lung screening CT scan.  ?

## 2021-11-14 NOTE — Telephone Encounter (Signed)
Copied from CRM [id number]. Topic: Medicare AWV ?>> Nov 14, 2021 12:27 PM Claudette Laws R wrote: ?Reason for CRM:  ?No answer unable to leave a  message for patient to call back and schedule Medicare Annual Wellness Visit (AWV) in office.  ? ?If unable to come into the office for AWV,  please offer to do virtually or by telephone. ? ?Last AWV: 05/11/2020 ? ?Please schedule at anytime with [facility] Health Advisor. ? ?30 minute appointment for Virtual or phone ?45 minute appointment for in office or Initial virtual/phone ? ?Any questions, please contact me at [phone] ?

## 2021-11-15 ENCOUNTER — Encounter: Payer: Medicare PPO | Admitting: Physical Therapy

## 2021-11-17 ENCOUNTER — Encounter: Payer: Medicare PPO | Admitting: Physical Therapy

## 2021-11-21 ENCOUNTER — Ambulatory Visit: Payer: Medicare PPO | Admitting: Internal Medicine

## 2021-12-14 ENCOUNTER — Telehealth: Payer: Self-pay | Admitting: Family Medicine

## 2021-12-20 ENCOUNTER — Ambulatory Visit: Payer: Medicare PPO | Admitting: Family Medicine

## 2022-01-10 ENCOUNTER — Telehealth: Payer: Self-pay | Admitting: Family Medicine

## 2022-01-10 NOTE — Telephone Encounter (Signed)
Copied from CRM [id number]. Topic: Medicare AWV >> Jan 10, 2022  2:06 PM Claudette Laws R wrote: Reason for CRM:  Left message for patient to call back and schedule Medicare Annual Wellness Visit (AWV) in office.   If unable to come into the office for AWV,  please offer to do virtually or by telephone.  Last AWV: 05/11/2020  Please schedule at anytime with [facility] Health Advisor.  30 minute appointment for Virtual or phone 45 minute appointment for in office or Initial virtual/phone  Any questions, please contact me at [phone]

## 2022-04-13 ENCOUNTER — Ambulatory Visit: Payer: Medicare PPO | Admitting: Family Medicine

## 2022-04-15 ENCOUNTER — Emergency Department: Payer: Medicare PPO

## 2022-04-15 ENCOUNTER — Emergency Department
Admission: EM | Admit: 2022-04-15 | Discharge: 2022-04-15 | Disposition: A | Discharge status: Home / Self Care | Payer: Medicare PPO | Attending: Emergency Medicine | Admitting: Emergency Medicine

## 2022-04-15 ENCOUNTER — Encounter: Payer: Self-pay | Admitting: Emergency Medicine

## 2022-04-15 ENCOUNTER — Other Ambulatory Visit: Payer: Self-pay

## 2022-04-15 DIAGNOSIS — R42 Dizziness and giddiness: Secondary | ICD-10-CM | POA: Diagnosis not present

## 2022-04-15 DIAGNOSIS — R0789 Other chest pain: Secondary | ICD-10-CM | POA: Diagnosis not present

## 2022-04-15 LAB — CBC
HCT: 39 % (ref 36.0–46.0)
Hemoglobin: 13.3 g/dL (ref 12.0–15.0)
MCH: 30.4 pg (ref 26.0–34.0)
MCHC: 34.1 g/dL (ref 30.0–36.0)
MCV: 89.2 fL (ref 80.0–100.0)
Platelets: 356 10*3/uL (ref 150–400)
RBC: 4.37 MIL/uL (ref 3.87–5.11)
RDW: 12.1 % (ref 11.5–15.5)
WBC: 7.3 10*3/uL (ref 4.0–10.5)
nRBC: 0 % (ref 0.0–0.2)

## 2022-04-15 LAB — BASIC METABOLIC PANEL
Anion gap: 8 (ref 5–15)
BUN: 14 mg/dL (ref 8–23)
CO2: 20 mmol/L — ABNORMAL LOW (ref 22–32)
Calcium: 9.7 mg/dL (ref 8.9–10.3)
Chloride: 112 mmol/L — ABNORMAL HIGH (ref 98–111)
Creatinine, Ser: 0.78 mg/dL (ref 0.44–1.00)
GFR, Estimated: >60 mL/min (ref >60)
Glucose, Bld: 100 mg/dL — ABNORMAL HIGH (ref 70–99)
Potassium: 4.1 mmol/L (ref 3.5–5.1)
Sodium: 140 mmol/L (ref 135–145)

## 2022-04-15 LAB — TROPONIN I (HIGH SENSITIVITY)
Troponin I (High Sensitivity): 3 ng/L (ref <18)
Troponin I (High Sensitivity): 2 ng/L (ref <18)
Troponin I (High Sensitivity): <2 ng/L (ref <18)

## 2022-04-15 MED ORDER — FAMOTIDINE 20 MG PO TABS
40.0000 mg | ORAL_TABLET | Freq: Once | ORAL | Status: AC
  Administered 2022-04-15: 40 mg via ORAL
  Filled 2022-04-15: qty 2

## 2022-04-15 MED ORDER — ALUM & MAG HYDROXIDE-SIMETH 200-200-20 MG/5ML PO SUSP
30.0000 mL | Freq: Once | ORAL | Status: AC
  Administered 2022-04-15: 30 mL via ORAL
  Filled 2022-04-15: qty 30

## 2022-04-15 NOTE — ED Triage Notes (Signed)
Pt in via EMS from home with c/o CP. EMS reports pressure like pain for 4 days, pain is substernal, non radiating and feels like someone is standing on her chest. 1/10 pain, Hx of anxiety. Pt working to get some help to be able to follow up with her MD, can't afford. Pt also with intermittent dizziness
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
## 2022-04-15 NOTE — ED Notes (Signed)
Pt given lunchbox.

## 2022-04-15 NOTE — ED Notes (Signed)
Pt reports mild nagging central chest pain and indigestion. Pt is AOX4, in NAD.

## 2022-04-15 NOTE — ED Provider Notes (Signed)
[facility] Provider Note    Event Date/Time   First MD Initiated Contact with Patient 04/15/22 1714     (approximate)   History   Chief Complaint: Chest Pain and Dizziness   HPI  Jenna Meyers is a 66 y.o. female with a history of LVH, anxiety, fibromyalgia who comes ED complaining of central chest pressure that is been going for 5 days, constant, no aggravating or alleviating factors, nonradiating, no shortness of breath diaphoresis or vomiting.  Not exertional, not pleuritic.  She notes that she has been feeling very stressed and overwhelmed recently due to financial concerns.  She is on limited income with disability benefits, living in a 2 bedroom home, and her son and his girlfriend and their 2 children are living with her.  Neither of the other adults works, so she is supporting everybody on her limited income.  Because of this she feels she does not have money to go to her doctor or obtain her medications.  She is safe at home.  Denies SI or HI, no hallucinations.     Physical Exam   Triage Vital Signs: ED Triage Vitals  Enc Vitals Group     BP 04/15/22 1143 (!) 142/122     Pulse Rate 04/15/22 1143 74     Resp 04/15/22 1143 20     Temp 04/15/22 1143 98.1 F (36.7 C)     Temp Source 04/15/22 1143 Oral     SpO2 04/15/22 1143 100 %     Weight 04/15/22 1141 150 lb (68 kg)     Height 04/15/22 1141 5\' 6"  (1.676 m)     Head Circumference --      Peak Flow --      Pain Score 04/15/22 1140 1     Pain Loc --      Pain Edu? --      Excl. in GC? --     Most recent vital signs: Vitals:   04/15/22 1730 04/15/22 1835  BP: (!) 139/91 136/73  Pulse: 79 65  Resp: (!) 24 17  Temp:    SpO2: 100% 98%    General: Awake, no distress.  CV:  Good peripheral perfusion.  Regular rate and rhythm Resp:  Normal effort.  Clear to auscultation bilateral Abd:  No distention.  Soft nontender Other:  No lower extremity edema.  Moist oral mucosa   ED  Results / Procedures / Treatments   Labs (all labs ordered are listed, but only abnormal results are displayed) Labs Reviewed  BASIC METABOLIC PANEL - Abnormal; Notable for the following components:      Result Value   Chloride 112 (*)    CO2 20 (*)    Glucose, Bld 100 (*)    All other components within normal limits  CBC  TROPONIN I (HIGH SENSITIVITY)  TROPONIN I (HIGH SENSITIVITY)  TROPONIN I (HIGH SENSITIVITY)     EKG Interpreted by me Normal sinus rhythm rate of 72.  Normal axis and intervals.  Normal QRS ST segments and T waves.   RADIOLOGY Chest x-ray interpreted by me, appears normal.  Radiology report reviewed.   PROCEDURES:  Procedures   MEDICATIONS ORDERED IN ED: Medications  alum & mag hydroxide-simeth (MAALOX/MYLANTA) 200-200-20 MG/5ML suspension 30 mL (30 mLs Oral Given 04/15/22 1733)  famotidine (PEPCID) tablet 40 mg (40 mg Oral Given 04/15/22 1734)     IMPRESSION / MDM / ASSESSMENT AND PLAN / ED COURSE  I reviewed the triage vital signs  and the nursing notes.                              Differential diagnosis includes, but is not limited to, non-STEMI, AKI, electrolyte abnormality, anemia, pneumonia, pneumothorax, pleural effusion, anxiety, stress reaction/adjustment disorder  Patient's presentation is most consistent with acute presentation with potential threat to life or bodily function.  Patient presents with chest pressure, atypical and unlikely to be cardiac.  I suspect this is due to her severe level of stress from financial concerns along with sharing a 2 bedroom house with 4 other people whom she does not feel are adequately participating in home and family upkeep.  She has a primary care appointment in 3 days and notes that her son is currently seeking employment which would greatly help.   Considering the patient's symptoms, medical history, and physical examination today, I have low suspicion for ACS, PE, TAD, pneumothorax, carditis,  mediastinitis, pneumonia, CHF, or sepsis.  Initial EKG chest x-ray exam and labs are all normal.  We will repeat troponin, if stable I think she is not requiring admission and can be discharged to her primary care follow-up.       FINAL CLINICAL IMPRESSION(S) / ED DIAGNOSES   Final diagnoses:  Atypical chest pain     Rx / DC Orders   ED Discharge Orders     None        Note:  This document was prepared using Dragon voice recognition software and may include unintentional dictation errors.   Sharman Cheek, MD 04/15/22 [phone]

## 2022-04-15 NOTE — ED Provider Triage Note (Signed)
Emergency Medicine Provider Triage Evaluation Note  Carneshia Raker, a 66 y.o. female  was evaluated in triage.  Pt complains of pain as well as increased social stressors.  Patient reports financial struggles at home cleaning her adult children staying with her and her house began in disrepair.  She has had some difficulty paying for her utilities as well.  She presents to the ED via EMS with 4 days of pressure-like chest pain that is substernal and nonradiating.  She does endorse a history of anxiety.  Review of Systems  Positive: Chest pain, anxiety Negative: SOB  Physical Exam  BP (!) 142/122 (BP Location: Right Arm)   Pulse 74   Temp 98.1 F (36.7 C) (Oral)   Resp 20   Ht 5\' 6"  (1.676 m)   Wt 68 kg   SpO2 100%   BMI 24.21 kg/m  Gen:   Awake, no distress. Anxious, tearful Resp:  Normal effort CTA MSK:   Moves extremities without difficulty  CVS:  RRR  Medical Decision Making  Medically screening exam initiated at 11:59 AM.  Appropriate orders placed.  Elke Holtry was informed that the remainder of the evaluation will be completed by another provider, this initial triage assessment does not replace that evaluation, and the importance of remaining in the ED until their evaluation is complete.  Patient to the ED for evaluation of chest pain for the last 4 days.  Patient also reports increase financial and familial stressors.   Kathrin Penner, PA-C 04/15/22 1201

## 2022-04-18 ENCOUNTER — Ambulatory Visit: Payer: Medicare PPO | Admitting: Family Medicine

## 2022-04-25 ENCOUNTER — Ambulatory Visit: Payer: Medicare PPO | Admitting: Family Medicine

## 2022-06-02 ENCOUNTER — Ambulatory Visit: Payer: Medicare PPO | Admitting: Family Medicine

## 2022-06-02 DIAGNOSIS — I10 Essential (primary) hypertension: Secondary | ICD-10-CM

## 2022-06-02 DIAGNOSIS — E782 Mixed hyperlipidemia: Secondary | ICD-10-CM

## 2022-06-02 DIAGNOSIS — F330 Major depressive disorder, recurrent, mild: Secondary | ICD-10-CM

## 2022-06-02 DIAGNOSIS — Z23 Encounter for immunization: Secondary | ICD-10-CM

## 2022-06-02 DIAGNOSIS — Z1211 Encounter for screening for malignant neoplasm of colon: Secondary | ICD-10-CM

## 2022-06-02 DIAGNOSIS — F419 Anxiety disorder, unspecified: Secondary | ICD-10-CM

## 2022-06-02 DIAGNOSIS — Z1231 Encounter for screening mammogram for malignant neoplasm of breast: Secondary | ICD-10-CM

## 2022-06-13 ENCOUNTER — Telehealth: Payer: Self-pay | Admitting: Family Medicine

## 2022-06-13 ENCOUNTER — Ambulatory Visit: Payer: Self-pay | Admitting: *Deleted

## 2022-06-13 NOTE — Telephone Encounter (Signed)
Copied from [place] [id number] [id number]. Topic: General - Inquiry >> Jun 13, 2022 12:01 PM Jenna Meyers wrote: Reason for CRM: Pt is requesting a call back from a Education officer, museum. Declined to provide further details pt stated has some questions.   Please advise.

## 2022-06-13 NOTE — Chronic Care Management (AMB) (Signed)
  Care Coordination   Note   06/13/2022 Name: [name] MRN: [id number] DOB: 01/14/1956  Jenna Meyers is a 66 y.o. year old female who sees Delsa Grana, [place] for primary care. I reached out to [name] by phone today to offer care coordination services.  [name] was given information about Care Coordination services today including:   The Care Coordination services include support from the care team which includes your Nurse Coordinator, Clinical Social Worker, or Pharmacist.  The Care Coordination team is here to help remove barriers to the health concerns and goals most important to you. Care Coordination services are voluntary, and the patient may decline or stop services at any time by request to their care team member.   Care Coordination Consent Status: Patient agreed to services and verbal consent obtained.   Follow up plan:  Telephone appointment with care coordination team member scheduled for:  06/14/2022  Encounter Outcome:  Pt. Scheduled  Julian Hy, [place] Direct Dial: [phone]

## 2022-06-14 ENCOUNTER — Ambulatory Visit: Payer: Self-pay | Admitting: *Deleted

## 2022-06-14 ENCOUNTER — Telehealth: Payer: Self-pay | Admitting: *Deleted

## 2022-06-14 NOTE — Patient Outreach (Addendum)
  Care Coordination   Initial Visit Note   06/14/2022 Name: Aquita Simmering MRN: [id number] DOB: [birth year]  Teniola Tseng is a 66 y.o. year old female who sees Delsa Grana, [place] for primary care. I spoke with  [name] by phone today.  What matters to the patients health and wellness today?   Medicare enrollment   Goals Addressed             This Visit's Progress    Help with medicare enrollement       Care Coordination Interventions: Patient requesting assistance with her Medicare benefits, specifically a Flex Plan Collaboration phone call to the [place] -patient would need to change to a medicare advantage plan Patient agreed to call the representative [phone] ext [phone] , however the representative called her before she could reach out to her who referred over to another representative to assist with a new plan Advantage Plan with the Flex Card  Patient will call independently to enroll in the new plan, patient's son to assist This social worker to follow up with patient to assess for any additional concerns         SDOH assessments and interventions completed:  Yes  SDOH Interventions Today    Flowsheet Row Most Recent Value  SDOH Interventions   Food Insecurity Interventions Intervention Not Indicated  Housing Interventions Intervention Not Indicated  Transportation Interventions Intervention Not Indicated        Care Coordination Interventions Activated:  Yes  Care Coordination Interventions:  Yes, provided   Follow up plan: No further intervention required.   Encounter Outcome:  Pt. Visit Completed

## 2022-06-14 NOTE — Patient Instructions (Addendum)
Visit Information  Thank you for taking time to visit with me today. Please don't hesitate to contact me if I can be of assistance to you.   Following are the goals we discussed today:   Goals Addressed             This Visit's Progress    Help with medicare enrollement       Care Coordination Interventions: Patient requesting assistance with her Medicare benefits, specifically a Flex Plan Collaboration phone call to the [place] -patient would need to change to a medicare advantage plan Patient agreed to call the representative [phone] , however the representative called her before she could reach out to her who referred over to another representative to assist with a new plan Advantage Plan with the Flex Card  Patient will call independently to enroll in the new plan, patient's son to assist This social worker to follow up with patient to assess for any additional concerns           Please call the care guide team at [phone] if you need to cancel or reschedule your appointment.   If you are experiencing a Mental Health or [place] or need someone to talk to, please call the Suicide and Crisis Lifeline: 988 call 911   Patient verbalizes understanding of instructions and care plan provided today and agrees to view in [place]. Active MyChart status and patient understanding of how to access instructions and care plan via MyChart confirmed with patient.     No further follow up required-patient to call this Education officer, museum with any additional community resource needs  [place], [place] Worker  [facility] Care Management [phone]

## 2022-06-14 NOTE — Patient Outreach (Signed)
  Care Coordination   06/14/2022 Name: Jenna Meyers MRN: [id number] DOB: [birth year]   Care Coordination Outreach Attempts:  An unsuccessful telephone outreach was attempted for a scheduled appointment today.  Follow Up Plan:  Additional outreach attempts will be made to offer the patient care coordination information and services.   Encounter Outcome:  No Answer  Care Coordination Interventions Activated:  Yes   Care Coordination Interventions:  No, not indicated     Ammarie Matsuura, [place] Worker  [facility] Care Management [phone]

## 2022-06-22 ENCOUNTER — Encounter: Payer: Medicare PPO | Admitting: *Deleted

## 2022-08-09 ENCOUNTER — Ambulatory Visit: Payer: Medicare PPO | Admitting: Family Medicine

## 2022-08-15 ENCOUNTER — Ambulatory Visit: Payer: Medicare PPO | Admitting: Family Medicine

## 2022-08-15 ENCOUNTER — Encounter: Payer: Self-pay | Admitting: Family Medicine

## 2022-08-15 VITALS — BP 118/72 | HR 91 | Resp 16 | Ht 64.0 in | Wt 162.0 lb

## 2022-08-15 DIAGNOSIS — J40 Bronchitis, not specified as acute or chronic: Secondary | ICD-10-CM

## 2022-08-15 DIAGNOSIS — J310 Chronic rhinitis: Secondary | ICD-10-CM | POA: Diagnosis not present

## 2022-08-15 DIAGNOSIS — Z1231 Encounter for screening mammogram for malignant neoplasm of breast: Secondary | ICD-10-CM | POA: Diagnosis not present

## 2022-08-15 DIAGNOSIS — Z1211 Encounter for screening for malignant neoplasm of colon: Secondary | ICD-10-CM

## 2022-08-15 DIAGNOSIS — Z820 Family history of epilepsy and other diseases of the nervous system: Secondary | ICD-10-CM

## 2022-08-15 DIAGNOSIS — Z780 Asymptomatic menopausal state: Secondary | ICD-10-CM

## 2022-08-15 DIAGNOSIS — R413 Other amnesia: Secondary | ICD-10-CM

## 2022-08-15 MED ORDER — ALBUTEROL SULFATE HFA 108 (90 BASE) MCG/ACT IN AERS: 2.0000 | INHALATION_SPRAY | Freq: Four times a day (QID) | RESPIRATORY_TRACT | 0 refills | Status: DC | PRN

## 2022-08-15 MED ORDER — LEVOCETIRIZINE DIHYDROCHLORIDE 5 MG PO TABS: 5.0000 mg | ORAL_TABLET | Freq: Every evening | ORAL | 3 refills | Status: DC

## 2022-08-15 MED ORDER — FLUTICASONE PROPIONATE 50 MCG/ACT NA SUSP: 2.0000 | Freq: Every day | NASAL | 6 refills | Status: DC

## 2022-08-15 NOTE — Patient Instructions (Signed)
Take the antihistamine daily at bedtime (xyzal was sent in, you can take zyrtec, claritin, etc)  Do the steroid nose spray - if it bothers you try to do it every other day  You can also add sudafed (get from the pharmacist - there is short acting or longer acting or you can get claritin-D and take in the am and continue to take the xyzal at bedtime)  It will usually take several weeks to see the effect of the treatment above
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
## 2022-09-29 ENCOUNTER — Ambulatory Visit: Payer: Medicare PPO

## 2022-09-29 ENCOUNTER — Telehealth: Payer: Self-pay

## 2022-09-29 NOTE — Telephone Encounter (Signed)
Pt scheduled for [place] at 0815. I called at [phone] and 0815 to complete. I left msg to rtn call at [phone] if able within 15 minutes to allow for appt or call our office or utilize mychart to reschedule. B.Earlie Arciga,LPN

## 2022-10-02 ENCOUNTER — Telehealth: Payer: Self-pay | Admitting: Family Medicine

## 2022-10-02 NOTE — Telephone Encounter (Signed)
LVM for pt to rtn my call to schedule AWV with [facility] call back # [phone] 

## 2022-11-13 ENCOUNTER — Telehealth: Payer: Self-pay | Admitting: Family Medicine

## 2022-11-13 NOTE — Telephone Encounter (Signed)
Called patient to schedule Medicare Annual Wellness Visit (AWV). Left message for patient to call back and schedule Medicare Annual Wellness Visit (AWV).  Last date of AWV: 05/11/2020  Please schedule an appointment at any time with [facility] .  If any questions, please contact me at [phone].  Thank you ,  [place] Direct Dial: [phone]

## 2022-12-20 ENCOUNTER — Ambulatory Visit: Payer: Medicare PPO | Admitting: Family Medicine

## 2023-01-03 ENCOUNTER — Other Ambulatory Visit: Payer: Self-pay | Admitting: Family Medicine

## 2023-01-03 DIAGNOSIS — M797 Fibromyalgia: Secondary | ICD-10-CM

## 2023-01-03 DIAGNOSIS — M48061 Spinal stenosis, lumbar region without neurogenic claudication: Secondary | ICD-10-CM

## 2023-01-03 DIAGNOSIS — G8929 Other chronic pain: Secondary | ICD-10-CM

## 2023-01-03 MED ORDER — GABAPENTIN 100 MG PO CAPS: 100.0000 mg | ORAL_CAPSULE | Freq: Three times a day (TID) | ORAL | 0 refills | Status: DC

## 2023-01-03 NOTE — Telephone Encounter (Signed)
Medication Refill - Medication: gabapentin (NEURONTIN) 100 MG capsule   Pt stated that due to financial and transportation issues, she has been unable to come in for her appointments. Pt sounded emotional and just requested a possible refill on her medication until she can arrange to come in for an appointment.  Please advise.  Has the patient contacted their pharmacy? No.  (Agent: If no, request that the patient contact the pharmacy for the refill. If patient does not wish to contact the pharmacy document the reason why and proceed with request.)  Preferred Pharmacy (with phone number or street name):  [facility] [address] [place] - [address]  [address] [place] [address]  Phone: [phone] Fax: [phone]  Hours: Not open 24 hours   Has the patient been seen for an appointment in the last year OR does the patient have an upcoming appointment? No.  Agent: Please be advised that RX refills may take up to 3 business days. We ask that you follow-up with your pharmacy.

## 2023-01-03 NOTE — Telephone Encounter (Signed)
Requested Prescriptions  Pending Prescriptions Disp Refills   gabapentin (NEURONTIN) 100 MG capsule 90 capsule 3    Sig: Take 1 capsule (100 mg total) by mouth 3 (three) times daily.     Neurology: Anticonvulsants - gabapentin Passed - 01/03/2023  3:57 PM      Passed - Cr in normal range and within 360 days    Creat  Date Value Ref Range Status  07/19/2021 0.83 0.50 - 1.05 mg/dL Final   Creatinine, Ser  Date Value Ref Range Status  04/15/2022 0.78 0.44 - 1.00 mg/dL Final         Passed - Completed PHQ-2 or PHQ-9 in the last 360 days      Passed - Valid encounter within last 12 months    Recent Outpatient Visits           4 months ago Rhinitis, unspecified type   [facility] Danelle Berry, PA-C   1 year ago Anxiety   [facility] Mecum, Oswaldo Conroy, PA-C   1 year ago Well woman exam   [facility] Danelle Berry, PA-C   1 year ago Mixed hyperlipidemia   [facility] Danelle Berry, PA-C   1 year ago Moderate episode of recurrent major depressive disorder [facility])   [facility] Danelle Berry, [place]

## 2023-02-06 ENCOUNTER — Telehealth: Payer: Self-pay | Admitting: Family Medicine

## 2023-02-06 NOTE — Telephone Encounter (Unsigned)
Copied from CRM [id number]. Topic: Referral - Request for Referral >> Feb 06, 2023  3:51 PM Dondra Prader A wrote: Has patient seen PCP for this complaint? Yes.   *If NO, is insurance requiring patient see PCP for this issue before PCP can refer them? Referral for which specialty: Orthopedic Doctor Preferred provider/office: Pt would like for PCP to refer, would like the first available appt.  Reason for referral: Pt states that her left shoulder has been hurting, she think she has bursitis in her shoulder.

## 2023-02-07 NOTE — Telephone Encounter (Signed)
No documentation on recent visits about her shoulder pain. She will need an appt for referral to be placed.

## 2023-02-07 NOTE — Telephone Encounter (Signed)
Pt will need to call back once she knows when she can come in due to not being able to drive

## 2023-02-26 ENCOUNTER — Telehealth: Payer: Self-pay | Admitting: Family Medicine

## 2023-02-26 NOTE — Telephone Encounter (Addendum)
Stated experiencing bursitis on left arm shoulder sharp shooting pain that goes away in the pit of her left armpit. Pain is a 7-10 stated annoying, going on for a week.  Pt declined to speak to NT .   Pt scheduled for first available tomorrow at 11:00.  FYI to office

## 2023-02-27 ENCOUNTER — Ambulatory Visit (INDEPENDENT_AMBULATORY_CARE_PROVIDER_SITE_OTHER): Payer: Medicare PPO | Admitting: Family Medicine

## 2023-02-27 ENCOUNTER — Encounter: Payer: Self-pay | Admitting: Family Medicine

## 2023-02-27 VITALS — BP 126/74 | HR 89 | Temp 97.9°F | Resp 16 | Ht 64.0 in | Wt 161.0 lb

## 2023-02-27 DIAGNOSIS — M25512 Pain in left shoulder: Secondary | ICD-10-CM

## 2023-02-27 DIAGNOSIS — M25511 Pain in right shoulder: Secondary | ICD-10-CM

## 2023-02-27 DIAGNOSIS — M797 Fibromyalgia: Secondary | ICD-10-CM

## 2023-02-27 MED ORDER — GABAPENTIN 300 MG PO CAPS: 300.0000 mg | ORAL_CAPSULE | Freq: Three times a day (TID) | ORAL | 1 refills | Status: DC

## 2023-02-27 MED ORDER — DULOXETINE HCL 30 MG PO CPEP: 30.0000 mg | ORAL_CAPSULE | Freq: Two times a day (BID) | ORAL | 1 refills | Status: DC

## 2023-02-27 MED ORDER — CYCLOBENZAPRINE HCL 5 MG PO TABS: 5.0000 mg | ORAL_TABLET | Freq: Three times a day (TID) | ORAL | 1 refills | Status: DC | PRN

## 2023-02-27 MED ORDER — PREDNISONE 20 MG PO TABS: 40.0000 mg | ORAL_TABLET | Freq: Every day | ORAL | 0 refills | Status: DC

## 2023-02-27 NOTE — Progress Notes (Signed)
Patient ID: Jenna Meyers, female    DOB: 1956/03/27, 67 y.o.   MRN: [id number]  PCP: Danelle Berry, PA-C  Chief Complaint  Patient presents with   Shoulder Pain    Left shoulder Over a week pt believes it could be bursitis but also started with pain on right shoulder for the past 2-3 days    Subjective:   Jenna Meyers is a 67 y.o. female, presents to clinic with CC of the following:  HPI  Bilateral shoulder pain, with pain flaring up in multiple places her left shoulder began over a week ago very painful to move with limited range of motion and then over the past 2 to 3 days her pain started on the right side as well without any recent acute injury she does have history of intermittent shoulder pain in the past  FMS -she has not been on all of her medications -including Cymbalta and she does notice worse day-to-day symptoms  Patient Active Problem List   Diagnosis Date Noted   Memory problem 08/15/2022   Family history of Alzheimer's disease 08/15/2022   Mild episode of recurrent major depressive disorder (HCC) 09/26/2021   Hyperlipidemia 08/12/2019   Vitamin D deficiency disease 08/12/2019   Hallucinations, visual 03/09/2019   Morbid obesity (HCC) 04/14/2018   Moderate episode of recurrent major depressive disorder (HCC) 04/14/2018   Aortic insufficiency 03/19/2018   LVH (left ventricular hypertrophy) 03/19/2018   Grade I diastolic dysfunction 03/19/2018   Mitral regurgitation 03/19/2018   Lumbar canal stenosis 09/14/2017   Emphysema of lung (HCC) 05/31/2017   Palpitations 05/30/2017   Medication monitoring encounter 02/09/2016   Essential hypertension, benign 05/17/2015   Acute allergic reaction 05/17/2015   Situational stress 05/17/2015   Obesity 03/03/2015   Abnormal thyroid blood test 03/03/2015   History of tobacco abuse 03/03/2015   Lumbago    Fibromyalgia    Anxiety 10/09/2012      Current Outpatient Medications:    albuterol (VENTOLIN HFA) 108 (90  Base) MCG/ACT inhaler, Inhale 2 puffs into the lungs every 6 (six) hours as needed for wheezing or shortness of breath., Disp: 8 g, Rfl: 0   Cholecalciferol (VITAMIN D3) 125 MCG (5000 UT) CAPS, Take by mouth. Pt takes once a week , Disp: , Rfl:    dicyclomine (BENTYL) 20 MG tablet, Take 1 tablet (20 mg total) by mouth 3 (three) times daily as needed for spasms (abd cramping)., Disp: 60 tablet, Rfl: 2   fluticasone (FLONASE) 50 MCG/ACT nasal spray, Place 2 sprays into both nostrils daily., Disp: 16 g, Rfl: 6   gabapentin (NEURONTIN) 100 MG capsule, Take 1 capsule (100 mg total) by mouth 3 (three) times daily., Disp: 90 capsule, Rfl: 0   levocetirizine (XYZAL) 5 MG tablet, Take 1 tablet (5 mg total) by mouth every evening., Disp: 90 tablet, Rfl: 3   sertraline (ZOLOFT) 50 MG tablet, Take 1 tablet (50 mg total) by mouth daily. (Patient not taking: Reported on 02/27/2023), Disp: 30 tablet, Rfl: 3   No Known Allergies   Social History   Tobacco Use   Smoking status: Former    Packs/day: 1.50    Years: 34.00    Additional pack years: 0.00    Total pack years: 51.00    Types: Cigarettes    Quit date: 2020    Years since quitting: 4.5   Smokeless tobacco: Never   Tobacco comments:    been given a Rx for Chantix quit for 1 yr and started  back  Vaping Use   Vaping Use: Never used  Substance Use Topics   Alcohol use: Yes    Comment: rare   Drug use: No      Chart Review Today: I personally reviewed active problem list, medication list, allergies, family history, social history, health maintenance, notes from last encounter, lab results, imaging with the patient/caregiver today.   Review of Systems  Constitutional: Negative.   HENT: Negative.    Eyes: Negative.   Respiratory: Negative.    Cardiovascular: Negative.   Gastrointestinal: Negative.   Endocrine: Negative.   Genitourinary: Negative.   Musculoskeletal: Negative.   Skin: Negative.   Allergic/Immunologic: Negative.    Neurological: Negative.   Hematological: Negative.   Psychiatric/Behavioral: Negative.    All other systems reviewed and are negative.      Objective:   Vitals:   02/27/23 1142  BP: 126/74  Pulse: 89  Resp: 16  Temp: 97.9 F (36.6 C)  TempSrc: Oral  SpO2: 98%  Weight: 161 lb (73 kg)  Height: 5\' 4"  (1.626 m)    Body mass index is 27.64 kg/m.  Physical Exam Vitals and nursing note reviewed.  Constitutional:      Appearance: She is well-developed.  HENT:     Head: Normocephalic and atraumatic.     Nose: Nose normal.  Eyes:     General:        Right eye: No discharge.        Left eye: No discharge.     Conjunctiva/sclera: Conjunctivae normal.  Neck:     Trachea: No tracheal deviation.  Cardiovascular:     Rate and Rhythm: Normal rate and regular rhythm.  Pulmonary:     Effort: Pulmonary effort is normal. No respiratory distress.     Breath sounds: No stridor.  Musculoskeletal:     Right shoulder: No swelling, deformity, tenderness or bony tenderness. Normal range of motion. Normal strength. Normal pulse.     Left shoulder: No swelling, deformity, tenderness or bony tenderness. Decreased range of motion. Normal strength. Normal pulse.  Skin:    General: Skin is warm and dry.     Findings: No rash.  Neurological:     Mental Status: She is alert.     Motor: No abnormal muscle tone.     Coordination: Coordination normal.  Psychiatric:        Behavior: Behavior normal.      Results for orders placed or performed during the hospital encounter of 04/15/22  Basic metabolic panel  Result Value Ref Range   Sodium 140 135 - 145 mmol/L   Potassium 4.1 3.5 - 5.1 mmol/L   Chloride 112 (H) 98 - 111 mmol/L   CO2 20 (L) 22 - 32 mmol/L   Glucose, Bld 100 (H) 70 - 99 mg/dL   BUN 14 8 - 23 mg/dL   Creatinine, Ser 1.61 0.44 - 1.00 mg/dL   Calcium 9.7 8.9 - 09.6 mg/dL   GFR, Estimated >04 >54 mL/min   Anion gap 8 5 - 15  CBC  Result Value Ref Range   WBC 7.3 4.0 -  10.5 K/uL   RBC 4.37 3.87 - 5.11 MIL/uL   Hemoglobin 13.3 12.0 - 15.0 g/dL   HCT 09.8 11.9 - 14.7 %   MCV 89.2 80.0 - 100.0 fL   MCH 30.4 26.0 - 34.0 pg   MCHC 34.1 30.0 - 36.0 g/dL   RDW 82.9 56.2 - 13.0 %   Platelets 356 150 - 400 K/uL  nRBC 0.0 0.0 - 0.2 %  Troponin I (High Sensitivity)  Result Value Ref Range   Troponin I (High Sensitivity) 3 <18 ng/L  Troponin I (High Sensitivity)  Result Value Ref Range   Troponin I (High Sensitivity) 2 <18 ng/L  Troponin I (High Sensitivity)  Result Value Ref Range   Troponin I (High Sensitivity) <2 <18 ng/L       Assessment & Plan:   1. Fibromyalgia Restart cymbalta for better control of chronic sx/pain Increase gabapentin dose and frequency - titrate dose up for pain management - DULoxetine (CYMBALTA) 30 MG capsule; Take 1 capsule (30 mg total) by mouth 2 (two) times daily.  Dispense: 180 capsule; Refill: 1 - gabapentin (NEURONTIN) 300 MG capsule; Take 1 capsule (300 mg total) by mouth 3 (three) times daily.  Dispense: 270 capsule; Refill: 1    2. Acute pain of both shoulders Will try steroids - for possible arthritis flare No injury or strain We reviewed past xrays, discussed options for management including ortho eval, xrays, or PT Pt will try to rehab - hope it will Improve ROM/function and decrease pain - predniSONE (DELTASONE) 20 MG tablet; Take 2 tablets (40 mg total) by mouth daily.  Dispense: 10 tablet; Refill: 0 - cyclobenzaprine (FLEXERIL) 5 MG tablet; Take 1 tablet (5 mg total) by mouth 3 (three) times daily as needed for muscle spasms.  Dispense: 30 tablet; Refill: 1 - Ambulatory referral to Occupational Therapy  Return for 4-6 week f/up on pain/meds.       Danelle Berry, PA-C 02/27/23 12:12 PM

## 2023-02-27 NOTE — Progress Notes (Deleted)
Patient ID: Jenna Meyers, female    DOB: [birth year], 67 y.o.   MRN: [id number]  PCP: Danelle Berry, PA-C  Chief Complaint  Patient presents with   Shoulder Pain    Left shoulder Over a week pt believes it could be bursitis but also started with pain on right shoulder for the past 2-3 days    Subjective:   Jenna Meyers is a 67 y.o. female, presents to clinic with CC of the following:  HPI    Gabapentin 300 TID Cymbalta 30 BID  Flexeril  PT referral  Prednisone?  If shooting nerve pain    Patient Active Problem List   Diagnosis Date Noted   Memory problem 08/15/2022   Family history of Alzheimer's disease 08/15/2022   Mild episode of recurrent major depressive disorder (HCC) 09/26/2021   Hyperlipidemia 08/12/2019   Vitamin D deficiency disease 08/12/2019   Hallucinations, visual 03/09/2019   Morbid obesity (HCC) 04/14/2018   Moderate episode of recurrent major depressive disorder (HCC) 04/14/2018   Aortic insufficiency 03/19/2018   LVH (left ventricular hypertrophy) 03/19/2018   Grade I diastolic dysfunction 03/19/2018   Mitral regurgitation 03/19/2018   Lumbar canal stenosis 09/14/2017   Emphysema of lung (HCC) 05/31/2017   Palpitations 05/30/2017   Medication monitoring encounter 02/09/2016   Essential hypertension, benign 05/17/2015   Acute allergic reaction 05/17/2015   Situational stress 05/17/2015   Obesity 03/03/2015   Abnormal thyroid blood test 03/03/2015   History of tobacco abuse 03/03/2015   Lumbago    Fibromyalgia    Anxiety 10/09/2012      Current Outpatient Medications:    albuterol (VENTOLIN HFA) 108 (90 Base) MCG/ACT inhaler, Inhale 2 puffs into the lungs every 6 (six) hours as needed for wheezing or shortness of breath., Disp: 8 g, Rfl: 0   Cholecalciferol (VITAMIN D3) 125 MCG (5000 UT) CAPS, Take by mouth. Pt takes once a week , Disp: , Rfl:    dicyclomine (BENTYL) 20 MG tablet, Take 1 tablet (20 mg total) by mouth 3 (three) times  daily as needed for spasms (abd cramping)., Disp: 60 tablet, Rfl: 2   fluticasone (FLONASE) 50 MCG/ACT nasal spray, Place 2 sprays into both nostrils daily., Disp: 16 g, Rfl: 6   gabapentin (NEURONTIN) 100 MG capsule, Take 1 capsule (100 mg total) by mouth 3 (three) times daily., Disp: 90 capsule, Rfl: 0   levocetirizine (XYZAL) 5 MG tablet, Take 1 tablet (5 mg total) by mouth every evening., Disp: 90 tablet, Rfl: 3   sertraline (ZOLOFT) 50 MG tablet, Take 1 tablet (50 mg total) by mouth daily. (Patient not taking: Reported on 02/27/2023), Disp: 30 tablet, Rfl: 3   No Known Allergies   Social History   Tobacco Use   Smoking status: Former    Packs/day: 1.50    Years: 34.00    Additional pack years: 0.00    Total pack years: 51.00    Types: Cigarettes    Quit date: 2020    Years since quitting: 4.5   Smokeless tobacco: Never   Tobacco comments:    been given a Rx for Chantix quit for 1 yr and started back  Vaping Use   Vaping Use: Never used  Substance Use Topics   Alcohol use: Yes    Comment: rare   Drug use: No      Chart Review Today: I personally reviewed active problem list, medication list, allergies, family history, social history, health maintenance, notes from last encounter, lab results, imaging  with the patient/caregiver today. I met she notes  Review of Systems     Objective:   Vitals:   02/27/23 1142  BP: 126/74  Pulse: 89  Resp: 16  Temp: 97.9 F (36.6 C)  TempSrc: Oral  SpO2: 98%  Weight: 161 lb (73 kg)  Height: 5\' 4"  (1.626 m)    Body mass index is 27.64 kg/m.  Physical Exam   Results for orders placed or performed during the hospital encounter of 04/15/22  Basic metabolic panel  Result Value Ref Range   Sodium 140 135 - 145 mmol/L   Potassium 4.1 3.5 - 5.1 mmol/L   Chloride 112 (H) 98 - 111 mmol/L   CO2 20 (L) 22 - 32 mmol/L   Glucose, Bld 100 (H) 70 - 99 mg/dL   BUN 14 8 - 23 mg/dL   Creatinine, Ser 1.61 0.44 - 1.00 mg/dL   Calcium  9.7 8.9 - 09.6 mg/dL   GFR, Estimated >04 >54 mL/min   Anion gap 8 5 - 15  CBC  Result Value Ref Range   WBC 7.3 4.0 - 10.5 K/uL   RBC 4.37 3.87 - 5.11 MIL/uL   Hemoglobin 13.3 12.0 - 15.0 g/dL   HCT 09.8 11.9 - 14.7 %   MCV 89.2 80.0 - 100.0 fL   MCH 30.4 26.0 - 34.0 pg   MCHC 34.1 30.0 - 36.0 g/dL   RDW 82.9 56.2 - 13.0 %   Platelets 356 150 - 400 K/uL   nRBC 0.0 0.0 - 0.2 %  Troponin I (High Sensitivity)  Result Value Ref Range   Troponin I (High Sensitivity) 3 <18 ng/L  Troponin I (High Sensitivity)  Result Value Ref Range   Troponin I (High Sensitivity) 2 <18 ng/L  Troponin I (High Sensitivity)  Result Value Ref Range   Troponin I (High Sensitivity) <2 <18 ng/L       Assessment & Plan:   ***     Danelle Berry, PA-C 02/27/23 12:14 PM

## 2023-03-22 ENCOUNTER — Telehealth: Payer: Self-pay | Admitting: Family Medicine

## 2023-03-22 NOTE — Telephone Encounter (Signed)
Copied from CRM [id number]. Topic: Referral - Question >> Mar 22, 2023 12:32 PM Haroldine Laws wrote: Reason for CRM: pt would like a referral for neurologist for memory.

## 2023-03-23 NOTE — Telephone Encounter (Signed)
Appt sch'd for Monday 8.5.2024 with Jenna Meyers

## 2023-03-23 NOTE — Telephone Encounter (Signed)
Pt needs appt

## 2023-03-26 ENCOUNTER — Telehealth (INDEPENDENT_AMBULATORY_CARE_PROVIDER_SITE_OTHER): Payer: Medicare PPO | Admitting: Family Medicine

## 2023-03-26 DIAGNOSIS — R413 Other amnesia: Secondary | ICD-10-CM

## 2023-03-26 DIAGNOSIS — Z820 Family history of epilepsy and other diseases of the nervous system: Secondary | ICD-10-CM
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
## 2023-06-25 NOTE — Progress Notes (Unsigned)
   Established Patient Office Visit  Subjective   Patient ID: Jenna Meyers, female    DOB: Sep 01, 1955  Age: 67 y.o. MRN: [id number]  No chief complaint on file.   HPI  Patient presenting for routine  medical follow up and to discuss referrals. She is new to me today.   Hypertension: -Medications: *** -Patient is compliant with above medications and reports no side effects. -Checking BP at home (average): *** -Highest BP at home: *** -Lowest BP at home: *** -Denies any SOB, CP, vision changes, LE edema or symptoms of hypotension -Diet: *** -Exercise: ***  HLD: -Medications: *** -Patient is compliant with above medications and reports no side effects. *** -Last lipid panel: ***  COPD: -COPD status: {Blank single:19197::"controlled","uncontrolled","better","worse","exacerbated","stable"} -Current medications: *** -Satisfied with current treatment?: {Blank single:19197::"yes","no"} -Oxygen use: {Blank single:19197::"yes","no"} -Dyspnea frequency:  -Cough frequency:  -Rescue inhaler frequency:   -Limitation of activity: {Blank single:19197::"yes","no"} -Productive cough:  -Last Spirometry/PFTs:  -Pneumovax: {Blank single:19197::"Up to Date","Not up to Date","unknown"} -Influenza: {Blank single:19197::"Up to Date","Not up to Date","unknown"}  Fibromyalgia:    {History (Optional):23778}  ROS    Objective:     There were no vitals taken for this visit. {Vitals History (Optional):23777}  Physical Exam   No results found for any visits on 06/26/23.  {Labs (Optional):23779}  The 10-year ASCVD risk score (Arnett DK, et al., 2019) is: 5%    Assessment & Plan:   Problem List Items Addressed This Visit   None   No follow-ups on file.    Margarita Mail, DO

## 2023-06-26 ENCOUNTER — Encounter: Payer: Self-pay | Admitting: Internal Medicine

## 2023-06-26 ENCOUNTER — Ambulatory Visit (INDEPENDENT_AMBULATORY_CARE_PROVIDER_SITE_OTHER): Payer: Medicare PPO | Admitting: Internal Medicine

## 2023-06-26 VITALS — BP 116/80 | HR 98 | Temp 98.1°F | Resp 18 | Ht 64.0 in | Wt 159.0 lb

## 2023-06-26 DIAGNOSIS — E559 Vitamin D deficiency, unspecified: Secondary | ICD-10-CM

## 2023-06-26 DIAGNOSIS — M48061 Spinal stenosis, lumbar region without neurogenic claudication: Secondary | ICD-10-CM | POA: Diagnosis not present

## 2023-06-26 DIAGNOSIS — Z1211 Encounter for screening for malignant neoplasm of colon: Secondary | ICD-10-CM

## 2023-06-26 DIAGNOSIS — N951 Menopausal and female climacteric states: Secondary | ICD-10-CM

## 2023-06-26 DIAGNOSIS — G5793 Unspecified mononeuropathy of bilateral lower limbs: Secondary | ICD-10-CM

## 2023-06-26 DIAGNOSIS — Z13220 Encounter for screening for lipoid disorders: Secondary | ICD-10-CM

## 2023-06-26 DIAGNOSIS — Z23 Encounter for immunization: Secondary | ICD-10-CM

## 2023-06-26 DIAGNOSIS — M62838 Other muscle spasm: Secondary | ICD-10-CM | POA: Diagnosis not present

## 2023-06-26 MED ORDER — CYCLOBENZAPRINE HCL 5 MG PO TABS: 5.0000 mg | ORAL_TABLET | Freq: Three times a day (TID) | ORAL | 1 refills | Status: DC | PRN

## 2023-06-26 NOTE — Patient Instructions (Addendum)
It was great seeing you today!  Plan discussed at today's visit: -Blood work ordered today, results will be uploaded to MyChart. Please return any weekday 8:30 - 11:30 and 1:30 -3:30  -Try black cohosh - consider Effexor for hot flashes/mood symptoms -Consider switching to Lyrica  -Cologuard ordered   Follow up in: 1 month   Take care and let us know if you have any questions or concerns prior to your next visit.  Dr. Jacobo Forest Cohosh Oral Dosage Forms What is this medication? BLACK COHOSH (blak KOH hosh) may relieve the symptoms of menopause. The FDA has not evaluated this supplement for any medical use. It may contain ingredients not listed. Discuss all supplements you are taking with your care team. They can provide you with important safety information. This medicine may be used for other purposes; ask your health care provider or pharmacist if you have questions. What should I tell my care team before I take this medication? They need to know if you have any of these conditions: Breast cancer Cervical, ovarian or uterine cancer High blood pressure Infertility Liver disease Menstrual changes or irregular periods Unusual vaginal or uterine bleeding An unusual or allergic reaction to black cohosh, soybeans, tartrazine dye (yellow dye number 5), other supplements, foods, dyes, or preservatives Pregnant or trying to get pregnant Breast-feeding How should I use this medication? Take this herb by mouth with a glass of water. Follow the directions on the package labeling, or talk to your care team. Do not use for longer than 6 months without the advice of your care team. Do not use if you are pregnant or breast-feeding. Talk to your care team. This supplement is not for use in children under the age of 18 years. Overdosage: If you think you have taken too much of this medicine contact a poison control center or emergency room at once. NOTE: This medicine is only for you. Do not  share this medicine with others. What if I miss a dose? If you miss a dose, take it as soon as you can. If it is almost time for your next dose, take only that dose. Do not take double or extra doses. What may interact with this medication? Atorvastatin Cisplatin Fertility treatments This list may not describe all possible interactions. Give your health care provider a list of all the medicines, herbs, non-prescription drugs, or dietary supplements you use. Also tell them if you smoke, drink alcohol, or use illegal drugs. Some items may interact with your medicine. What should I watch for while using this medication? Since this supplement is derived from a plant, allergic reactions are possible. Stop using this supplement if you develop a rash. You may need to see your care team, or inform them that this occurred. Report any unusual side effects promptly. If you are taking this supplement for menstrual or menopausal symptoms, visit your care team for regular checks on your progress. You should have a complete check-up every 6 months. You will need a regular breast and pelvic exam while on this therapy. Follow the advice of your care team. Women should inform their care team if they wish to become pregnant or think they might be pregnant. If you have any reason to think you are pregnant, stop taking this supplement at once and contact your care team. Herbal or dietary supplements are not regulated like medications. Rigid quality control standards are not required for dietary supplements. The purity and strength of these products can vary. The  safety and effect of this dietary supplement for a certain disease or illness is not well known. This product is not intended to diagnose, treat, cure or prevent any disease. The Food and Drug Administration suggests the following to help consumers protect themselves: Always read product labels and follow directions. Natural does not mean a product is safe for  humans to take. Look for products that include USP after the ingredient name. This means that the manufacturer followed the standards of the [place].S[place] Pharmacopoeia. Supplements made or sold by a nationally known food or drug company are more likely to be made under tight controls. You can write to the company for more information about how the product was made. What side effects may I notice from receiving this medication? Side effects that you should report to your care team as soon as possible: Allergic reactions--skin rash, itching, hives, swelling of the face, lips, tongue, or throat Liver injury--right upper belly pain, loss of appetite, nausea, light-colored stool, dark yellow or brown urine, yellowing skin or eyes, unusual weakness or fatigue Side effects that usually do not require medical attention (report to your care team if they continue or are bothersome): Breast pain or tenderness Irregular menstrual cycles or spotting Nausea Upset stomach This list may not describe all possible side effects. Call your doctor for medical advice about side effects. You may report side effects to FDA at 1-800-FDA-1088. Where should I keep my medication? Keep out of the reach of children and pets. Store at room temperature between 15 and 30 degrees C (59 and 86 degrees F). Get rid of any unused supplement after the expiration date. NOTE: This sheet is a summary. It may not cover all possible information. If you have questions about this medicine, talk to your doctor, pharmacist, or health care provider.  2024 Elsevier/Gold Standard (2021-10-05 00:00:00)

## 2023-07-04 ENCOUNTER — Ambulatory Visit: Payer: Self-pay | Admitting: *Deleted

## 2023-07-04 NOTE — Telephone Encounter (Signed)
  Chief Complaint: medication question: Can gabapentin and duloxetine be taken together?   Disposition: [] ED /[] Urgent Care (no appt availability in office) / [] Appointment(In office/virtual)/ []  [facility]/ [] Home Care/ [] Refused Recommended Disposition /[] [facility]/ [x]  Follow-up with PCP Additional Notes:  Patient notified per [employer] no drug to drug interaction- but the duloxetine is no longer listed as active medication on her active medication list. She can take to pharmacy for disposal.

## 2023-07-04 NOTE — Telephone Encounter (Signed)
Summary: medication question   Patient called stated she wanted to make sure she could take bother medications together. She is taking gabapentin (NEURONTIN) 300 MG and DULoxetine (CYMBALTA) 30 MG. Please f/u with patient     Reason for Disposition . Caller has medicine question only, adult not sick, AND triager answers question  Answer Assessment - Initial Assessment Questions 1. NAME of MEDICINE: "What medicine(s) are you calling about?"     Gabapentin and duloxetine  2. QUESTION: "What is your question?" (e.g., double dose of medicine, side effect)     Can patient take together? 3. PRESCRIBER: "Who prescribed the medicine?" Reason: if prescribed by specialist, call should be referred to that group.     PCP Patient notified per [employer] no drug to drug interaction- but the duloxetine is no longer listed as active medication on her active medication list. She can take to pharmacy for disposal.  Protocols used: Medication Question Call-A-AH

## 2023-07-23 LAB — COLOGUARD: COLOGUARD: NEGATIVE

## 2023-08-02 ENCOUNTER — Ambulatory Visit: Payer: Medicare PPO | Admitting: Physician Assistant

## 2023-08-02 ENCOUNTER — Ambulatory Visit: Payer: Self-pay | Admitting: *Deleted

## 2023-08-02 ENCOUNTER — Ambulatory Visit
Admission: RE | Admit: 2023-08-02 | Discharge: 2023-08-02 | Disposition: A | Discharge status: Home / Self Care | Payer: Medicare PPO | Source: Ambulatory Visit | Attending: Physician Assistant | Admitting: Physician Assistant

## 2023-08-02 ENCOUNTER — Ambulatory Visit
Admission: RE | Admit: 2023-08-02 | Discharge: 2023-08-02 | Disposition: A | Discharge status: Home / Self Care | Payer: Medicare PPO | Attending: Physician Assistant | Admitting: Physician Assistant

## 2023-08-02 VITALS — BP 122/68 | HR 90 | Resp 16 | Ht 64.0 in | Wt 159.0 lb

## 2023-08-02 DIAGNOSIS — R0781 Pleurodynia: Secondary | ICD-10-CM

## 2023-08-02 DIAGNOSIS — G8929 Other chronic pain: Secondary | ICD-10-CM

## 2023-08-02 DIAGNOSIS — M25552 Pain in left hip: Secondary | ICD-10-CM | POA: Insufficient documentation

## 2023-08-02 DIAGNOSIS — M25562 Pain in left knee: Secondary | ICD-10-CM | POA: Insufficient documentation

## 2023-08-02 DIAGNOSIS — M25512 Pain in left shoulder: Secondary | ICD-10-CM | POA: Diagnosis present

## 2023-08-02 MED ORDER — MELOXICAM 7.5 MG PO TABS: 7.5000 mg | ORAL_TABLET | Freq: Every day | ORAL | 0 refills | Status: DC

## 2023-08-02 NOTE — Progress Notes (Signed)
Acute Office Visit   Patient: Jenna Meyers   DOB: 1956-06-15   67 y.o. Female  MRN: [id number] Visit Date: 08/02/2023  Today's healthcare provider: Oswaldo Conroy Ryhanna Dunsmore, PA-C  Introduced myself to the patient as a Secondary school teacher and provided education on APPs in clinical practice.    Chief Complaint  Patient presents with   Knee Pain    x1 month   Hip Pain    x1 month   Rib Injury    Hit side on counter bending over yesterday   Subjective    HPI HPI     Knee Pain    Additional comments: x1 month        Hip Pain    Additional comments: x1 month        Rib Injury    Additional comments: Hit side on counter bending over yesterday      Last edited by Dollene Primrose, CMA on 08/02/2023  2:32 PM.      Discussed the use of AI scribe software for clinical note transcription with the patient, who gave verbal consent to proceed.  History of Present Illness   The patient, residing in a retirement community, presents with multiple musculoskeletal complaints primarily on the left side. They report persistent shoulder pain, which has been exacerbated by a recent incident where they leaned over an armrest, resulting in a painful sensation and audible sound from the right rib area. The rib pain, rated as 5-6/10, is particularly bothersome at night, causing sleep disturbances.  In addition to the shoulder and rib discomfort, the patient also reports a clicking sensation in the knee and hip pain, all on the left side. These symptoms have worsened over the past month despite regular physical therapy sessions.  The patient has been managing the pain with their regular medication and occasional use of Motrin. They also mention a long-standing issue with lower back pain, which they suspect might be contributing to their current symptoms.  The patient has a history of stenosis in the lower spine and has previously fallen on their coccyx twice. They have not had any imaging or orthopedic  consultations for their current complaints. The patient's mother had a history of bursitis and arthritis.        Medications: Outpatient Medications Prior to Visit  Medication Sig   Cholecalciferol (VITAMIN D3) 125 MCG (5000 UT) CAPS Take by mouth. Pt takes once a week    cyclobenzaprine (FLEXERIL) 5 MG tablet Take 1 tablet (5 mg total) by mouth 3 (three) times daily as needed for muscle spasms.   gabapentin (NEURONTIN) 300 MG capsule Take 1 capsule (300 mg total) by mouth 3 (three) times daily.   No facility-administered medications prior to visit.    Review of Systems  Musculoskeletal:  Positive for arthralgias and myalgias.        Objective    BP 122/68   Pulse 90   Resp 16   Ht 5\' 4"  (1.626 m)   Wt 159 lb (72.1 kg)   SpO2 98%   BMI 27.29 kg/m     Physical Exam Vitals reviewed.  Constitutional:      Appearance: Normal appearance.  HENT:     Head: Normocephalic and atraumatic.  Pulmonary:     Effort: Pulmonary effort is normal.  Musculoskeletal:     Left shoulder: Tenderness and crepitus present. No swelling or effusion. Normal range of motion. Normal strength.       Arms:  Cervical back: Normal range of motion.     Comments: ROM findings Shoulder: Flexion, extension, abduction, adduction, internal and external rotation are intact. Negative empty can testing. Some drop noted with drop arm testing and during ROM check with relaxation following abduction  Thoracic: Lateral flexion and lateral rotation are intact and symmetrical- there is some pain with lateral rotation to the left in the hip  Lumbar: Extension, flexion are intact Hips: Extension, flexion, abduction, adduction are symmetrical and intact. Reports pain in inguinal crease and hip with left hip flexion. She has negative log roll testing bilaterally  Knees: ROM intact with regards to flexion and extension, mild crepitus appreciated. Negative Apley grind on left knee Foot and ankle: Passive ROM is  intact with mild pain noted along lateral metatarsals of left foot   Neurological:     General: No focal deficit present.     Mental Status: She is alert and oriented to person, place, and time.  Psychiatric:        Mood and Affect: Mood normal.        Behavior: Behavior normal.        Thought Content: Thought content normal.        Judgment: Judgment normal.       No results found for any visits on 08/02/23.  Assessment & Plan      No follow-ups on file.      Problem List Items Addressed This Visit   None Visit Diagnoses       Chronic left hip pain    -  Primary   Relevant Medications   meloxicam (MOBIC) 7.5 MG tablet   Other Relevant Orders   DG Hip Unilat W OR W/O Pelvis 2-3 Views Left   Ambulatory referral to Orthopedic Surgery     Chronic left shoulder pain       Relevant Medications   meloxicam (MOBIC) 7.5 MG tablet   Other Relevant Orders   DG Shoulder Left   Ambulatory referral to Orthopedic Surgery     Chronic pain of left knee       Relevant Medications   meloxicam (MOBIC) 7.5 MG tablet   Other Relevant Orders   DG Knee Complete 4 Views Left   Ambulatory referral to Orthopedic Surgery     Rib pain on right side             Assessment and Plan    Left Shoulder Pain Chronic left shoulder pain with clicking and discomfort, exacerbated by certain movements. Possible differential includes rotator cuff pathology, bursitis, or arthritis. Discussed potential outcomes of imaging and referral to orthopedics, including possible need for surgical intervention if severe pathology is found. - Order left shoulder x-ray - Refer to orthopedics for further evaluation and potential intervention  Left Knee Pain Chronic left knee pain with clicking and tightness, worsened over the past month. Possible differential includes osteoarthritis, meniscal injury, or ligamentous injury. Discussed potential outcomes of imaging and referral to orthopedics, including possible need  for surgical intervention if severe pathology is found. - Order left knee x-ray - Refer to orthopedics for further evaluation and potential intervention  Hip Pain Chronic left hip pain with discomfort in the groin area, exacerbated by certain movements. Possible differential includes osteoarthritis or bursitis. Discussed potential outcomes of imaging and referral to orthopedics, including possible need for surgical intervention if severe pathology is found. - Order left hip x-ray - Refer to orthopedics for further evaluation and potential intervention  Right Rib Pain Acute  right rib pain following a mechanical injury while leaning over. Pain rated 5-6/10, worse at night. No signs of severe complications such as pneumothorax or significant displacement. Discussed pain management with alternating acetaminophen and ibuprofen as needed. - Recommend alternating acetaminophen and ibuprofen as needed for pain management  Lumbar Spinal Stenosis Lumbar spinal stenosis with chronic lower back pain. Last evaluation was with Dr. Caralee Ates in November. Discussed potential impact on posture and related musculoskeletal issues. - Monitor symptoms and consider follow-up if condition worsens  General Health Maintenance Discussion about NSAID use and potential contraindications. Currently takes ibuprofen occasionally and has no known contraindications. Discussed alternative anti-inflammatory medication to be taken once daily to manage inflammation while awaiting imaging and orthopedic consultation. - Prescribe an alternative anti-inflammatory medication to be taken once daily Recommend she does not use other NSAIDs while taking Meloxicam for various joint complaints  - [facility]   Follow-up - Send imaging orders to [facility] for x-rays of left shoulder, left knee, and left hip - Advise to visit [facility] for walk-in x-rays - Follow up with orthopedics after imaging results are available.       No  follow-ups on file.   I, Ardell Aaronson E Tionne Carelli, PA-C, have reviewed all documentation for this visit. The documentation on 08/02/23 for the exam, diagnosis, procedures, and orders are all accurate and complete.   Jacquelin Hawking, MHS, PA-C [facility]

## 2023-08-02 NOTE — Telephone Encounter (Signed)
  Chief Complaint: Knee and Hip pain Symptoms: "arthritic" type pain left knee and hip. Right sided rib injury,pain Frequency: Knee and hip 1 month, rib yesterday Pertinent Negatives: Patient denies redness, warmth. States no bruising at rib Disposition: [] ED /[] Urgent Care (no appt availability in office) / [x] Appointment(In office/virtual)/ []  [facility]/ [] Home Care/ [] Refused Recommended Disposition /[]  Mobile Bus/ []  Follow-up with PCP Additional Notes:  Appt secured for today. CAre advise provided, pt verbalizes understanding. Reason for Disposition  [1] MODERATE pain (e.g., interferes with normal activities, limping) AND [2] present > 3 days  Answer Assessment - Initial Assessment Questions 1. LOCATION and RADIATION: "Where is the pain located?"      Left knee and left hip. Right rib pain 2. QUALITY: "What does the pain feel like?"  (e.g., sharp, dull, aching, burning     "Arthritis. 3. SEVERITY: "How bad is the pain?" "What does it keep you from doing?"   (Scale 1-10; or mild, moderate, severe)   -  MILD (1-3): doesn't interfere with normal activities    -  MODERATE (4-7): interferes with normal activities (e.g., work or school) or awakens from sleep, limping    -  SEVERE (8-10): excruciating pain, unable to do any normal activities, unable to walk     *No Answer* 4. ONSET: "When did the pain start?" "Does it come and go, or is it there all the time?"     1 month 5. RECURRENT: "Have you had this pain before?" If Yes, ask: "When, and what happened then?"     *No Answer* 6. SETTING: "Has there been any recent work, exercise or other activity that involved that part of the body?"      Rib pain inury 7. AGGRAVATING FACTORS: "What makes the knee pain worse?" (e.g., walking, climbing stairs, running)     *No Answer* 8. ASSOCIATED SYMPTOMS: "Is there any swelling or redness of the knee?"     no 9. OTHER SYMPTOMS: "Do you have any other symptoms?" (e.g., chest  pain, difficulty breathing, fever, calf pain)     *No Answer*  Protocols used: Knee Pain-A-AH

## 2023-08-02 NOTE — Patient Instructions (Addendum)
  I recommend the following at this time to help relieve that discomfort:  Rest Warm compresses to the area (20 minutes on, minimum of 30 minutes off) You can alternate Tylenol and Ibuprofen for pain management but Ibuprofen is typically preferred to reduce inflammation.  Tylenol is preferred due to your kidney function Gentle stretches and exercises that I have included in your paperwork Try to reduce excess strain to the area and rest as much as possible  Wear supportive shoes and, if you must lift anything, use proper lifting techniques that spare your back.   If these measures do not lead to improvement in your symptoms over the next 2-4 weeks please let us know     Please go here for your  xray   [address] [place] [address] [place], [place] [address]   VISIT SUMMARY:  During today's visit, we discussed your ongoing musculoskeletal issues, including pain in your left shoulder, left knee, left hip, and right rib. We also reviewed your history of lumbar spinal stenosis and its potential impact on your current symptoms. We have planned several steps to diagnose and manage your pain effectively.  YOUR PLAN:  -LEFT SHOULDER PAIN: You have chronic pain in your left shoulder, which may be due to issues like rotator cuff problems, bursitis, or arthritis. We will get an x-ray and refer you to an orthopedic specialist to determine the exact cause and discuss possible treatments, including surgery if necessary.  -LEFT KNEE PAIN: Your left knee pain, which includes clicking and tightness, could be due to osteoarthritis, a meniscal injury, or a ligament issue. We will get an x-ray and refer you to an orthopedic specialist to find out the cause and discuss treatment options, including surgery if needed.  -HIP PAIN: The pain in your left hip, especially in the groin area, might be due to osteoarthritis or bursitis. We will get an x-ray and refer you to an orthopedic specialist to identify  the cause and discuss possible treatments, including surgery if required.  -RIGHT RIB PAIN: You have acute pain in your right rib following a recent injury. There are no signs of severe complications. We recommend alternating acetaminophen and ibuprofen to manage the pain.  -LUMBAR SPINAL STENOSIS: Your chronic lower back pain is due to lumbar spinal stenosis, which is a narrowing of the spaces within your spine. We will monitor your symptoms and consider further evaluation if your condition worsens.  -GENERAL HEALTH MAINTENANCE: We discussed the use of NSAIDs for pain management and prescribed an alternative anti-inflammatory medication to be taken once daily. [facility]. Please not take other NSAIDs while using Meloxicam  INSTRUCTIONS:  Please visit [facility] for walk-in x-rays of your left shoulder, left knee, and left hip. Follow up with the orthopedic specialist after the imaging results are available.

## 2023-08-09 ENCOUNTER — Telehealth: Payer: Self-pay | Admitting: Family Medicine

## 2023-08-09 NOTE — Telephone Encounter (Signed)
Copied from CRM [id number]. Topic: General - Other >> Aug 09, 2023  4:16 PM Dondra Prader E wrote: Reason for CRM: Pt called for imaging results

## 2023-08-10 NOTE — Telephone Encounter (Signed)
Copied from CRM [id number]. Topic: Appointment Scheduling - Scheduling Inquiry for Clinic >> Aug 09, 2023  4:18 PM Dondra Prader E wrote: Reason for CRM: Pt called wanting to follow up with Labs + Stenosis in her coccyx.

## 2023-08-10 NOTE — Telephone Encounter (Signed)
Left detailed vm no results yet

## 2023-08-13 ENCOUNTER — Other Ambulatory Visit: Payer: Self-pay

## 2023-08-13 ENCOUNTER — Ambulatory Visit (INDEPENDENT_AMBULATORY_CARE_PROVIDER_SITE_OTHER): Payer: Medicare PPO | Admitting: Internal Medicine

## 2023-08-13 ENCOUNTER — Encounter: Payer: Self-pay | Admitting: Internal Medicine

## 2023-08-13 VITALS — BP 130/80 | HR 80 | Temp 98.0°F | Resp 16 | Ht 64.0 in | Wt 170.2 lb

## 2023-08-13 DIAGNOSIS — Z1231 Encounter for screening mammogram for malignant neoplasm of breast: Secondary | ICD-10-CM

## 2023-08-13 DIAGNOSIS — R519 Headache, unspecified: Secondary | ICD-10-CM

## 2023-08-13 DIAGNOSIS — M48061 Spinal stenosis, lumbar region without neurogenic claudication: Secondary | ICD-10-CM

## 2023-08-13 NOTE — Progress Notes (Signed)
Established Patient Office Visit  Subjective   Patient ID: Jenna Meyers, female    DOB: 11/01/1955  Age: 67 y.o. MRN: [id number]  Chief Complaint  Patient presents with   Headache    HPI  Patient presenting for general concerns.   Patient has been dealing with "brain zaps" for a little over a week now, came on suddenly and feels like she's being zapped in different places all over her head, will last in one spot for a few seconds before moving to a different place. No other associated symptoms, no numbness, tingling, sensation changes, weakness, facial droop, etc. Will last for a few minutes and resolve, other times will turn into a  mild headache. No vision changes. Uncertain provoking factors. No new medications or recently discontinued medications. Did increase Gabapentin to 300 mg TID (patient taking 800 mg currently) back in July but she's been on this medication for years.   History of spinal canal stenosis: -Last MRI was in April 2021 which showed mild to moderate spinal stenosis L4-L5 which had improved compared to previous study due to decreased central disc protrusion -Patient is participating in physical therapy and overall feels like the pain is stable and not worsening, however it is consistent -She is on gabapentin 300 mg 3 times a day with mild to moderate relief -Has not been evaluated by Neurosurgery in many years  Health Maintenance: -Blood work due -Mammogram due  Patient Active Problem List   Diagnosis Date Noted   Memory problem 08/15/2022   Family history of Alzheimer's disease 08/15/2022   Mild episode of recurrent major depressive disorder (HCC) 09/26/2021   Hyperlipidemia 08/12/2019   Vitamin D deficiency disease 08/12/2019   Hallucinations, visual 03/09/2019   Morbid obesity (HCC) 04/14/2018   Moderate episode of recurrent major depressive disorder (HCC) 04/14/2018   Aortic insufficiency 03/19/2018   LVH (left ventricular hypertrophy) 03/19/2018    Grade I diastolic dysfunction 03/19/2018   Mitral regurgitation 03/19/2018   Lumbar canal stenosis 09/14/2017   Emphysema of lung (HCC) 05/31/2017   Palpitations 05/30/2017   Medication monitoring encounter 02/09/2016   Essential hypertension, benign 05/17/2015   Acute allergic reaction 05/17/2015   Situational stress 05/17/2015   Obesity 03/03/2015   Abnormal thyroid blood test 03/03/2015   History of tobacco abuse 03/03/2015   Lumbago    Fibromyalgia    Anxiety 10/09/2012   Past Medical History:  Diagnosis Date   Anxiety 10/09/2012   Aortic insufficiency 03/19/2018   ECHO July 2019   Arthritis    back   Depression    Emphysema lung (HCC)    Emphysema of lung (HCC)    Fibromyalgia    Grade I diastolic dysfunction 03/19/2018   ECHO July 2019   Hyperlipidemia    Hypertension    IBS (irritable bowel syndrome)    Loss of consciousness (HCC) 03/09/2019   Lumbago    Lumbar canal stenosis 09/14/2017   Moderate to severe, MRI Nov 2018   LVH (left ventricular hypertrophy) 03/19/2018   ECHO July 2019   Mitral regurgitation 03/19/2018   ECHO July 2019   Tobacco abuse    Vitamin D deficiency disease    Past Surgical History:  Procedure Laterality Date   ABDOMINAL HYSTERECTOMY  2000   partial, still has cervix   CESAREAN SECTION  1996   LAPAROSCOPIC ABDOMINAL EXPLORATION  1999   TONSILLECTOMY AND ADENOIDECTOMY  1962   Social History   Tobacco Use   Smoking status: Former  Current packs/day: 0.00    Average packs/day: 1.5 packs/day for 34.0 years (51.0 ttl pk-yrs)    Types: Cigarettes    Start date: 64    Quit date: 2020    Years since quitting: 4.9   Smokeless tobacco: Never   Tobacco comments:    been given a Rx for Chantix quit for 1 yr and started back  Vaping Use   Vaping status: Never Used  Substance Use Topics   Alcohol use: Yes    Comment: rare   Drug use: No   Social History   Socioeconomic History   Marital status: Divorced    Spouse name: Not on  file   Number of children: 1   Years of education: Not on file   Highest education level: Associate degree: academic program  Occupational History   Occupation: Disabled  Tobacco Use   Smoking status: Former    Current packs/day: 0.00    Average packs/day: 1.5 packs/day for 34.0 years (51.0 ttl pk-yrs)    Types: Cigarettes    Start date: 89    Quit date: 2020    Years since quitting: 4.9   Smokeless tobacco: Never   Tobacco comments:    been given a Rx for Chantix quit for 1 yr and started back  Vaping Use   Vaping status: Never Used  Substance and Sexual Activity   Alcohol use: Yes    Comment: rare   Drug use: No   Sexual activity: Not Currently    Partners: Male  Other Topics Concern   Not on file  Social History Narrative   Pt lives with her son and daughter in law and 2 small grandchildren   Social Drivers of Health   Financial Resource Strain: Low Risk  (07/19/2021)   Overall Financial Resource Strain (CARDIA)    Difficulty of Paying Living Expenses: Not very hard  Food Insecurity: No Food Insecurity (06/14/2022)   Hunger Vital Sign    Worried About Running Out of Food in the Last Year: Never true    Ran Out of Food in the Last Year: Never true  Transportation Needs: No Transportation Needs (06/14/2022)   PRAPARE - Administrator, Civil Service (Medical): No    Lack of Transportation (Non-Medical): No  Physical Activity: Inactive (07/19/2021)   Exercise Vital Sign    Days of Exercise per Week: 0 days    Minutes of Exercise per Session: 0 min  Stress: Stress Concern Present (07/19/2021)   [employer] of Occupational Health - Occupational Stress Questionnaire    Feeling of Stress : To some extent  Social Connections: Socially Isolated (07/19/2021)   Social Connection and Isolation Panel [NHANES]    Frequency of Communication with Friends and Family: More than three times a week    Frequency of Social Gatherings with Friends and Family: More  than three times a week    Attends Religious Services: Never    Database administrator or Organizations: Patient declined    Attends Banker Meetings: Never    Marital Status: Divorced  Catering manager Violence: Not At Risk (07/19/2021)   Humiliation, Afraid, Rape, and Kick questionnaire    Fear of Current or Ex-Partner: No    Emotionally Abused: No    Physically Abused: No    Sexually Abused: No   Family Status  Relation Name Status   Mother  Deceased at age 11   Father  Deceased at age 20   Son  Alive  Brother  Alive  No partnership data on file   Family History  Problem Relation Age of Onset   Cancer Mother        colon   Osteoporosis Mother    Hyperlipidemia Mother    Alzheimer's disease Father    Thyroid disease Son    Heart disease Brother    No Known Allergies    Review of Systems  HENT:  Negative for sinus pain.   Eyes:  Negative for blurred vision, double vision and pain.  Neurological:  Positive for headaches. Negative for dizziness, sensory change and weakness.  All other systems reviewed and are negative.     Objective:     BP 130/80 (Cuff Size: Normal)   Pulse 80   Temp 98 F (36.7 C) (Oral)   Resp 16   Ht 5\' 4"  (1.626 m)   Wt 170 lb 3.2 oz (77.2 kg)   SpO2 98%   BMI 29.21 kg/m  BP Readings from Last 3 Encounters:  08/13/23 130/80  08/02/23 122/68  06/26/23 116/80   Wt Readings from Last 3 Encounters:  08/13/23 170 lb 3.2 oz (77.2 kg)  08/02/23 159 lb (72.1 kg)  06/26/23 159 lb (72.1 kg)      Physical Exam Constitutional:      Appearance: Normal appearance.  HENT:     Head: Normocephalic and atraumatic.     Nose: Nose normal.     Mouth/Throat:     Mouth: Mucous membranes are moist.     Pharynx: Oropharynx is clear.  Eyes:     Extraocular Movements: Extraocular movements intact.     Conjunctiva/sclera: Conjunctivae normal.     Pupils: Pupils are equal, round, and reactive to light.  Cardiovascular:     Rate and  Rhythm: Normal rate and regular rhythm.  Pulmonary:     Effort: Pulmonary effort is normal.     Breath sounds: Normal breath sounds.  Skin:    General: Skin is warm and dry.  Neurological:     General: No focal deficit present.     Mental Status: She is alert. Mental status is at baseline.     Cranial Nerves: No cranial nerve deficit.     Sensory: No sensory deficit.     Motor: No weakness.  Psychiatric:        Mood and Affect: Mood normal.        Behavior: Behavior normal.      No results found for any visits on 08/13/23.  Last CBC Lab Results  Component Value Date   WBC 7.3 04/15/2022   HGB 13.3 04/15/2022   HCT 39.0 04/15/2022   MCV 89.2 04/15/2022   MCH 30.4 04/15/2022   RDW 12.1 04/15/2022   PLT 356 04/15/2022   Last metabolic panel Lab Results  Component Value Date   GLUCOSE 100 (H) 04/15/2022   NA 140 04/15/2022   K 4.1 04/15/2022   CL 112 (H) 04/15/2022   CO2 20 (L) 04/15/2022   BUN 14 04/15/2022   CREATININE 0.78 04/15/2022   GFRNONAA >60 04/15/2022   CALCIUM 9.7 04/15/2022   PROT 7.0 07/19/2021   ALBUMIN 4.1 06/28/2016   LABGLOB 2.4 09/03/2015   AGRATIO 1.8 09/03/2015   BILITOT 0.5 07/19/2021   ALKPHOS 56 06/28/2016   AST 15 07/19/2021   ALT 10 07/19/2021   ANIONGAP 8 04/15/2022   Last lipids Lab Results  Component Value Date   CHOL 173 07/19/2021   HDL 55 07/19/2021   LDLCALC 97  07/19/2021   TRIG 111 07/19/2021   CHOLHDL 3.1 07/19/2021   Last hemoglobin A1c Lab Results  Component Value Date   HGBA1C 5.5 01/31/2021   Last thyroid functions Lab Results  Component Value Date   TSH 4.66 (H) 07/19/2021   Last vitamin D Lab Results  Component Value Date   VD25OH 26 (L) 05/12/2020   Last vitamin B12 and Folate No results found for: "VITAMINB12", "FOLATE"    The 10-year ASCVD risk score (Arnett DK, et al., 2019) is: 6%    Assessment & Plan:   1. Nonintractable headache, unspecified chronicity pattern, unspecified headache type  (Primary): Discussed how brain zaps are usually associated with medication, whether its a side effect of withdrawal effect. Only change was increase in Gabapentin a few months ago and it is the only thing to help with back pain. Consider switching to something like Lyrica but will obtain labs ordered from last time to assess vitamins and thyroid function.   2. Spinal stenosis of lumbar region without neurogenic claudication: Participating in PT, will refer to Neurosurgery as she may benefit from injections.   - Ambulatory referral to Neurosurgery  3. Encounter for screening mammogram for malignant neoplasm of breast: Mammogram ordered.   - MM 3D SCREENING MAMMOGRAM BILATERAL BREAST; Future   Return if symptoms worsen or fail to improve.    Margarita Mail, DO

## 2023-08-14 LAB — LIPID PANEL
Cholesterol: 266 mg/dL — ABNORMAL HIGH (ref <200)
HDL: 72 mg/dL (ref >=50)
LDL Cholesterol (Calc): 172 mg/dL — ABNORMAL HIGH
Non-HDL Cholesterol (Calc): 194 mg/dL — ABNORMAL HIGH (ref <130)
Total CHOL/HDL Ratio: 3.7 (calc) (ref <5.0)
Triglycerides: 100 mg/dL (ref <150)

## 2023-08-14 LAB — COMPLETE METABOLIC PANEL WITH GFR
AG Ratio: 1.5 (calc) (ref 1.0–2.5)
ALT: 12 U/L (ref 6–29)
AST: 15 U/L (ref 10–35)
Albumin: 4.1 g/dL (ref 3.6–5.1)
Alkaline phosphatase (APISO): 64 U/L (ref 37–153)
BUN: 15 mg/dL (ref 7–25)
CO2: 24 mmol/L (ref 20–32)
Calcium: 9.3 mg/dL (ref 8.6–10.4)
Chloride: 106 mmol/L (ref 98–110)
Creat: 0.96 mg/dL (ref 0.50–1.05)
Globulin: 2.8 g/dL (ref 1.9–3.7)
Glucose, Bld: 82 mg/dL (ref 65–99)
Potassium: 4.7 mmol/L (ref 3.5–5.3)
Sodium: 138 mmol/L (ref 135–146)
Total Bilirubin: 0.4 mg/dL (ref 0.2–1.2)
Total Protein: 6.9 g/dL (ref 6.1–8.1)
eGFR: 65 mL/min/{1.73_m2} (ref >=60)

## 2023-08-14 LAB — VITAMIN D 25 HYDROXY (VIT D DEFICIENCY, FRACTURES): Vit D, 25-Hydroxy: 23 ng/mL — ABNORMAL LOW (ref 30–100)

## 2023-08-14 LAB — CBC WITH DIFFERENTIAL/PLATELET
Absolute Lymphocytes: 2118 {cells}/uL (ref 850–3900)
Absolute Monocytes: 414 {cells}/uL (ref 200–950)
Basophils Absolute: 48 {cells}/uL (ref 0–200)
Basophils Relative: 0.7 %
Eosinophils Absolute: 131 {cells}/uL (ref 15–500)
Eosinophils Relative: 1.9 %
HCT: 38 % (ref 35.0–45.0)
Hemoglobin: 12.5 g/dL (ref 11.7–15.5)
MCH: 30.7 pg (ref 27.0–33.0)
MCHC: 32.9 g/dL (ref 32.0–36.0)
MCV: 93.4 fL (ref 80.0–100.0)
MPV: 10.4 fL (ref 7.5–12.5)
Monocytes Relative: 6 %
Neutro Abs: 4188 {cells}/uL (ref 1500–7800)
Neutrophils Relative %: 60.7 %
Platelets: 346 10*3/uL (ref 140–400)
RBC: 4.07 10*6/uL (ref 3.80–5.10)
RDW: 12 % (ref 11.0–15.0)
Total Lymphocyte: 30.7 %
WBC: 6.9 10*3/uL (ref 3.8–10.8)

## 2023-08-14 LAB — B12 AND FOLATE PANEL
Folate: 16.3 ng/mL
Vitamin B-12: 257 pg/mL (ref 200–1100)

## 2023-08-14 LAB — MAGNESIUM: Magnesium: 2.2 mg/dL (ref 1.5–2.5)

## 2023-08-14 LAB — TSH: TSH: 1.96 m[IU]/L (ref 0.40–4.50)

## 2023-08-16 ENCOUNTER — Ambulatory Visit: Payer: Self-pay

## 2023-08-16 ENCOUNTER — Other Ambulatory Visit: Payer: Self-pay | Admitting: Internal Medicine

## 2023-08-16 DIAGNOSIS — E559 Vitamin D deficiency, unspecified: Secondary | ICD-10-CM

## 2023-08-16 MED ORDER — VITAMIN D (ERGOCALCIFEROL) 1.25 MG (50000 UNIT) PO CAPS: 50000.0000 [IU] | ORAL_CAPSULE | ORAL | 0 refills | Status: DC

## 2023-08-16 NOTE — Telephone Encounter (Signed)
Left message for patient to call office back to discuss her headaches

## 2023-08-16 NOTE — Telephone Encounter (Signed)
Message from [place] M sent at 08/16/2023  2:04 PM EST  Summary: Pt having chronic headaches and requests call back to discuss   Pt stated that she saw Dr. Caralee Ates earlier this week but now she is having chronic headaches. Pt requests call back to discuss and advise. Cb# [phone]          Chief Complaint: headache that is constant Symptoms: pain 3-4/10 "all over head" feels like pressure behind her eyes.  Frequency: 2 days Pertinent Negatives: Patient denies head injury or fever, stiff neck, sore throat, cold sx. Disposition: [] ED /[] Urgent Care (no appt availability in office) / [] Appointment(In office/virtual)/ []  [facility]/ [] Home Care/ [] Refused Recommended Disposition /[] [facility]/ [x]  Follow-up with PCP Additional Notes: head zaps have stopped per pt. Pt stated she will take some Ibuprofen to see if that will. Care advice given Advised pt will forward to PCP nurse since she was just in for a visit.   Reason for Disposition  [1] MILD-MODERATE headache AND [2] present > 72 hours  Answer Assessment - Initial Assessment Questions 1. LOCATION: "Where does it hurt?"      All over 2. ONSET: "When did the headache start?" (Minutes, hours or days)      2 days  3. PATTERN: "Does the pain come and go, or has it been constant since it started?"     constant 4. SEVERITY: "How bad is the pain?" and "What does it keep you from doing?"  (e.g., Scale 1-10; mild, moderate, or severe)   - MILD (1-3): doesn't interfere with normal activities    - MODERATE (4-7): interferes with normal activities or awakens from sleep    - SEVERE (8-10): excruciating pain, unable to do any normal activities        Mild moderate 5. RECURRENT SYMPTOM: "Have you ever had headaches before?" If Yes, ask: "When was the last time?" and "What happened that time?"      no 6. CAUSE: "What do you think is causing the headache?"     unsure 8. HEAD INJURY: "Has there been any recent injury to  the head?"      no 9. OTHER SYMPTOMS: "Do you have any other symptoms?" (fever, stiff neck, eye pain, sore throat, cold symptoms)     Eye pressure 10. PREGNANCY: "Is there any chance you are pregnant?" "When was your last menstrual period?"       N/a  Protocols used: Headache-A-AH

## 2023-08-21 NOTE — Progress Notes (Signed)
Your knee x-ray appears overall normal.  There is no signs of a joint effusion, bone abnormality or fracture.  The radiologist did suspect that there might be signs of osteopenia.

## 2023-08-21 NOTE — Progress Notes (Signed)
Your shoulder x-ray is back.  It appears that there is mild degenerative changes in the shoulder joint.  This is likely due to osteoarthritis. There also appears to be some cystic changes along your humerus which may be secondary to a rotator cuff injury.  Further imaging with an MRI would likely be necessary to evaluate this fully.

## 2023-08-21 NOTE — Progress Notes (Signed)
Your hip x-ray shows mild osteoarthritis of the left hip.

## 2023-08-27 ENCOUNTER — Encounter: Payer: Self-pay | Admitting: Acute Care

## 2023-08-28 ENCOUNTER — Encounter: Payer: Self-pay | Admitting: Family Medicine

## 2023-08-28 ENCOUNTER — Ambulatory Visit (INDEPENDENT_AMBULATORY_CARE_PROVIDER_SITE_OTHER): Payer: Medicare PPO | Admitting: Family Medicine

## 2023-08-28 VITALS — BP 138/68 | HR 73 | Resp 16 | Ht 64.0 in | Wt 176.0 lb

## 2023-08-28 DIAGNOSIS — Z6830 Body mass index (BMI) 30.0-30.9, adult: Secondary | ICD-10-CM

## 2023-08-28 DIAGNOSIS — J329 Chronic sinusitis, unspecified: Secondary | ICD-10-CM

## 2023-08-28 DIAGNOSIS — J439 Emphysema, unspecified: Secondary | ICD-10-CM | POA: Diagnosis not present

## 2023-08-28 DIAGNOSIS — Z780 Asymptomatic menopausal state: Secondary | ICD-10-CM | POA: Diagnosis not present

## 2023-08-28 DIAGNOSIS — G8929 Other chronic pain: Secondary | ICD-10-CM

## 2023-08-28 DIAGNOSIS — M48061 Spinal stenosis, lumbar region without neurogenic claudication: Secondary | ICD-10-CM

## 2023-08-28 DIAGNOSIS — E66811 Obesity, class 1: Secondary | ICD-10-CM

## 2023-08-28 DIAGNOSIS — F331 Major depressive disorder, recurrent, moderate: Secondary | ICD-10-CM

## 2023-08-28 DIAGNOSIS — M797 Fibromyalgia: Secondary | ICD-10-CM

## 2023-08-28 DIAGNOSIS — G5793 Unspecified mononeuropathy of bilateral lower limbs: Secondary | ICD-10-CM

## 2023-08-28 DIAGNOSIS — Z122 Encounter for screening for malignant neoplasm of respiratory organs: Secondary | ICD-10-CM

## 2023-08-28 DIAGNOSIS — M25552 Pain in left hip: Secondary | ICD-10-CM

## 2023-08-28 MED ORDER — DOXYCYCLINE HYCLATE 100 MG PO TABS: 100.0000 mg | ORAL_TABLET | Freq: Two times a day (BID) | ORAL | 0 refills | Status: AC

## 2023-08-28 MED ORDER — FLUTICASONE PROPIONATE 50 MCG/ACT NA SUSP: 2.0000 | Freq: Every day | NASAL | 6 refills | Status: AC

## 2023-08-28 MED ORDER — DULOXETINE HCL 30 MG PO CPEP: 30.0000 mg | ORAL_CAPSULE | Freq: Two times a day (BID) | ORAL | 1 refills | Status: DC
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
## 2023-08-28 NOTE — Patient Instructions (Signed)
 I would try some saline nasal sprays, for 2+ weeks recommend using daily to every other day a steroid nasal spray (flonase  nasonex nasocort or generic equivalent) and sudafed and see if that helps your symptoms. If your symptoms worsen I would stop the sudafed when starting the antibiotic.

## 2023-09-03 ENCOUNTER — Ambulatory Visit: Payer: Self-pay | Admitting: *Deleted

## 2023-09-03 NOTE — Telephone Encounter (Addendum)
  Chief Complaint: Continues to have sinus congestion since being seen on 08/28/2023. Symptoms: teeth pain, facial pressure, headaches.   Flonase  did not help as was suggested by [name], PA-C Frequency: Since seen on 08/28/2023 Pertinent Negatives: Patient denies fever or anything coming out. Disposition: [] ED /[] Urgent Care (no appt availability in office) / [] Appointment(In office/virtual)/ []  [facility]/ [] Home Care/ [] Refused Recommended Disposition /[] [place] [facility]/ [x]  Follow-up with PCP Additional Notes: Message sent to [name], PA-C.   Please send any rx to [facility].  Not [facility] mail order.

## 2023-09-03 NOTE — Telephone Encounter (Signed)
 Message from Yvone Marda Blow sent at 09/03/2023  1:16 PM EST  Summary: Teeth pain,  headache and pressure, facial pressure,   Teeth pain,  headache and facial pressure,  tried flonase  for a 1 week as directed by PCP, no drainage , no relief          Call History  Contact Date/Time Type Contact Phone/Fax By  09/03/2023 01:13 PM EST Phone (Incoming) Jenna Meyers, Jenna Meyers (Self) [phone] MIKE) [name]   Reason for Disposition  [1] Sinus congestion (pressure, fullness) AND [2] present > 10 days  Answer Assessment - Initial Assessment Questions 1. LOCATION: Where does it hurt?      I spoke with [name], PA-C she had me try the Flonase  for sinus congestion and issues.   I'm still no better.   I'm still having headaches, facial pressure and my teeth on one side ache.   I'm not getting anything to come out.   2. ONSET: When did the sinus pain start?  (e.g., hours, days)      Since seeing [name] on 08/28/2023. 3. SEVERITY: How bad is the pain?   (Scale 1-10; mild, moderate or severe)   - MILD (1-3): doesn't interfere with normal activities    - MODERATE (4-7): interferes with normal activities (e.g., work or school) or awakens from sleep   - SEVERE (8-10): excruciating pain and patient unable to do any normal activities        Moderate headaches, teeth pain and pressure 4. RECURRENT SYMPTOM: Have you ever had sinus problems before? If Yes, ask: When was the last time? and What happened that time?      Yes 5. NASAL CONGESTION: Is the nose blocked? If Yes, ask: Can you open it or must you breathe through your mouth?     Yes   nothing is coming out. 6. NASAL DISCHARGE: Do you have discharge from your nose? If so ask, What color?     No  nothing is coming out which is the problem.  7. FEVER: Do you have a fever? If Yes, ask: What is it, how was it measured, and when did it start?      No 8. OTHER SYMPTOMS: Do you have any other symptoms? (e.g.,  sore throat, cough, earache, difficulty breathing)     See above 9. PREGNANCY: Is there any chance you are pregnant? When was your last menstrual period?     N/A due to age  Protocols used: Sinus Pain or Congestion-A-AH

## 2023-09-04 ENCOUNTER — Ambulatory Visit
Admission: RE | Admit: 2023-09-04 | Discharge: 2023-09-04 | Disposition: A | Discharge status: Home / Self Care | Payer: Medicare PPO | Source: Ambulatory Visit | Attending: Internal Medicine | Admitting: Internal Medicine

## 2023-09-04 DIAGNOSIS — Z1231 Encounter for screening mammogram for malignant neoplasm of breast: Secondary | ICD-10-CM | POA: Diagnosis present
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
## 2023-09-06 NOTE — Telephone Encounter (Signed)
Called w/no answer, Lvm to see if taking/completed antibiotics.

## 2023-09-07 ENCOUNTER — Ambulatory Visit: Payer: Medicare PPO | Admitting: Orthopedic Surgery

## 2023-09-11 ENCOUNTER — Other Ambulatory Visit: Payer: Self-pay

## 2023-09-11 ENCOUNTER — Telehealth: Payer: Self-pay

## 2023-09-11 DIAGNOSIS — Z122 Encounter for screening for malignant neoplasm of respiratory organs: Secondary | ICD-10-CM

## 2023-09-11 DIAGNOSIS — Z87891 Personal history of nicotine dependence: Secondary | ICD-10-CM

## 2023-09-11 NOTE — Telephone Encounter (Signed)
.  Lung Cancer Screening Narrative/Criteria Questionnaire (Cigarette Smokers Only- No Cigars/Pipes/vapes)   Jenna Meyers   SDMV:09/17/2023 @ 10:30 am with Keturah Barre, RN   11-14-1955   LDCT: 09/25/2023 at 9:30am at [facility]    68 y.o.   Phone: [phone]  Lung Screening Narrative (confirm age 53-77 yrs Medicare / 50-80 yrs Private pay insurance)   Insurance information:[employer]   Referring Provider:[facility]   This screening involves an initial phone call with a team member from our program. It is called a shared decision making visit. The initial meeting is required by  insurance and Medicare to make sure you understand the program. This appointment takes about 15-20 minutes to complete. You will complete the screening scan at your scheduled date/time.  This scan takes about 5-10 minutes to complete. You can eat and drink normally before and after the scan.  Criteria questions for Lung Cancer Screening:   Are you a current or former smoker? Former Age began smoking: 15   If you are a former smoker, what year did you quit smoking? 2021(within 15 yrs) also quit for 15years   To calculate your smoking history, I need an accurate estimate of how many packs of cigarettes you smoked per day and for how many years. (Not just the number of PPD you are now smoking)   Years smoking 33 x Packs per day 2 = Pack years 39   (at least 20 pack yrs)   (Make sure they understand that we need to know how much they have smoked in the past, not just the number of PPD they are smoking now)  Do you have a personal history of cancer?  No    Do you have a family history of cancer? Yes  (cancer type and and relative) Mother-colon   Are you coughing up blood?  No  Have you had unexplained weight loss of 15 lbs or more in the last 6 months? No  It looks like you meet all criteria.  When would be a good time for [place] to schedule you for this screening?   Additional information: Patient had a  difficult time confirming quit day. PCP notes reviewed for quit date which patient states is accurate.

## 2023-09-13 ENCOUNTER — Telehealth: Payer: Self-pay | Admitting: Family Medicine

## 2023-09-14 NOTE — Progress Notes (Deleted)
 Referring Physician:  Danelle Berry, PA-C [address] [place],  [place] [address]  Primary Physician:  Danelle Berry, PA-C  History of Present Illness: 09/14/2023*** Jenna Meyers has a history of HTN, mitral regurgitation, emphysema/COPD, hyperlipidemia, FM, depression, IBS, LVH.   Saw Dr. Audelia Acton on 09/14/23 and was referred to physiatry.   History of LBP/buttock pain since coccyx fracture 10+ years ago. Had PT and ESIs years ago. She's in PT now for her back.   Do we have lumbar xrays from [facility]?***    Duration: *** Location: *** Quality: *** Severity: ***  Precipitating: aggravated by *** Modifying factors: made better by *** Weakness: none Timing: *** Bowel/Bladder Dysfunction: none  Conservative measures:  Physical therapy: in PT now for her back  Multimodal medical therapy including regular antiinflammatories: Gabapentin, motrin Injections: *** epidural steroid injections  Past Surgery: ***  [name] has ***no symptoms of cervical myelopathy.  The symptoms are causing a significant impact on the patient's life.   Review of Systems:  A 10 point review of systems is negative, except for the pertinent positives and negatives detailed in the HPI.  Past Medical History: Past Medical History:  Diagnosis Date   Anxiety 10/09/2012   Aortic insufficiency 03/19/2018   ECHO July 2019   Arthritis    back   Depression    Emphysema lung (HCC)    Emphysema of lung (HCC)    Fibromyalgia    Grade I diastolic dysfunction 03/19/2018   ECHO July 2019   Hyperlipidemia    Hypertension    IBS (irritable bowel syndrome)    Loss of consciousness (HCC) 03/09/2019   Lumbago    Lumbar canal stenosis 09/14/2017   Moderate to severe, MRI Nov 2018   LVH (left ventricular hypertrophy) 03/19/2018   ECHO July 2019   Mitral regurgitation 03/19/2018   ECHO July 2019   Tobacco abuse    Vitamin D deficiency disease     Past Surgical History: Past Surgical  History:  Procedure Laterality Date   ABDOMINAL HYSTERECTOMY  2000   partial, still has cervix   CESAREAN SECTION  1996   LAPAROSCOPIC ABDOMINAL EXPLORATION  1999   TONSILLECTOMY AND ADENOIDECTOMY  1962    Allergies: Allergies as of 09/18/2023   (No Known Allergies)    Medications: Outpatient Encounter Medications as of 09/18/2023  Medication Sig   cyclobenzaprine (FLEXERIL) 5 MG tablet Take 1 tablet (5 mg total) by mouth 3 (three) times daily as needed for muscle spasms. (Patient not taking: Reported on 08/28/2023)   DULoxetine (CYMBALTA) 30 MG capsule Take 1 capsule (30 mg total) by mouth 2 (two) times daily.   fluticasone (FLONASE) 50 MCG/ACT nasal spray Place 2 sprays into both nostrils daily.   gabapentin (NEURONTIN) 300 MG capsule Take 1 capsule (300 mg total) by mouth 3 (three) times daily.   meloxicam (MOBIC) 7.5 MG tablet Take 1 tablet (7.5 mg total) by mouth daily.   Vitamin D, Ergocalciferol, (DRISDOL) 1.25 MG (50000 UNIT) CAPS capsule Take 1 capsule (50,000 Units total) by mouth every 7 (seven) days.   No facility-administered encounter medications on file as of 09/18/2023.    Social History: Social History   Tobacco Use   Smoking status: Former    Current packs/day: 0.00    Average packs/day: 1.5 packs/day for 34.0 years (51.0 ttl pk-yrs)    Types: Cigarettes    Start date: 56    Quit date: 2020    Years since quitting: 5.0  Smokeless tobacco: Never   Tobacco comments:    been given a Rx for Chantix quit for 1 yr and started back  Vaping Use   Vaping status: Never Used  Substance Use Topics   Alcohol use: Yes    Comment: rare   Drug use: No    Family Medical History: Family History  Problem Relation Age of Onset   Cancer Mother        colon   Osteoporosis Mother    Hyperlipidemia Mother    Alzheimer's disease Father    Thyroid disease Son    Heart disease Brother     Physical Examination: There were no vitals filed for this  visit.  General: Patient is well developed, well nourished, calm, collected, and in no apparent distress. Attention to examination is appropriate.  Respiratory: Patient is breathing without any difficulty.   NEUROLOGICAL:     Awake, alert, oriented to person, place, and time.  Speech is clear and fluent. Fund of knowledge is appropriate.   Cranial Nerves: Pupils equal round and reactive to light.  Facial tone is symmetric.    *** ROM of cervical spine *** pain *** posterior cervical tenderness. *** tenderness in bilateral trapezial region.   *** ROM of lumbar spine *** pain *** posterior lumbar tenderness.   No abnormal lesions on exposed skin.   Strength: Side Biceps Triceps Deltoid Interossei Grip Wrist Ext. Wrist Flex.  R 5 5 5 5 5 5 5   L 5 5 5 5 5 5 5    Side Iliopsoas Quads Hamstring PF DF EHL  R 5 5 5 5 5 5   L 5 5 5 5 5 5    Reflexes are ***2+ and symmetric at the biceps, brachioradialis, patella and achilles.   Hoffman's is absent.  Clonus is not present.   Bilateral upper and lower extremity sensation is intact to light touch.     Gait is normal.   ***No difficulty with tandem gait.    Medical Decision Making  Imaging: Lumbar xrays dated ***:  ***  Report not available for above xrays.   Assessment and Plan: Jenna Meyers is a pleasant 68 y.o. female has ***  Treatment options discussed with patient and following plan made:   - Order for physical therapy for *** spine ***. Patient to call to schedule appointment. *** - Continue current medications including ***. Reviewed dosing and side effects.  - Prescription for ***. Reviewed dosing and side effects. Take with food.  - Prescription for *** to take prn muscle spasms. Reviewed dosing and side effects. Discussed this can cause drowsiness.  - MRI of *** to further evaluate *** radiculopathy. No improvement time or medications (***).  - Referral to PMR at [facility] to discuss possible *** injections.  - Will schedule  phone visit to review MRI results once I get them back.   I spent a total of *** minutes in face-to-face and non-face-to-face activities related to this patient's care today including review of outside records, review of imaging, review of symptoms, physical exam, discussion of differential diagnosis, discussion of treatment options, and documentation.   Thank you for involving me in the care of this patient.   [name] PA-C Dept. of Neurosurgery

## 2023-09-14 NOTE — Progress Notes (Deleted)
 Referring Physician:  Danelle Berry, PA-C [address] [place],  [place] [address]  Primary Physician:  Danelle Berry, PA-C  History of Present Illness: 09/14/2023*** [name] has a history of ***  Low back pain  Duration: *** Location: *** Quality: *** Severity: ***  Precipitating: aggravated by *** Modifying factors: made better by *** Weakness: none Timing: *** Bowel/Bladder Dysfunction: none  Conservative measures:  Physical therapy: has participated in PT at [facility]  Multimodal medical therapy including regular antiinflammatories: Cymbalta, Gabapentin, Meloxicam, Flexeril Injections: no epidural steroid injections  Past Surgery: none  Jenna Meyers has ***no symptoms of cervical myelopathy.  The symptoms are causing a significant impact on the patient's life.   Review of Systems:  A 10 point review of systems is negative, except for the pertinent positives and negatives detailed in the HPI.  Past Medical History: Past Medical History:  Diagnosis Date   Anxiety 10/09/2012   Aortic insufficiency 03/19/2018   ECHO July 2019   Arthritis    back   Depression    Emphysema lung (HCC)    Emphysema of lung (HCC)    Fibromyalgia    Grade I diastolic dysfunction 03/19/2018   ECHO July 2019   Hyperlipidemia    Hypertension    IBS (irritable bowel syndrome)    Loss of consciousness (HCC) 03/09/2019   Lumbago    Lumbar canal stenosis 09/14/2017   Moderate to severe, MRI Nov 2018   LVH (left ventricular hypertrophy) 03/19/2018   ECHO July 2019   Mitral regurgitation 03/19/2018   ECHO July 2019   Tobacco abuse    Vitamin D deficiency disease     Past Surgical History: Past Surgical History:  Procedure Laterality Date   ABDOMINAL HYSTERECTOMY  2000   partial, still has cervix   CESAREAN SECTION  1996   LAPAROSCOPIC ABDOMINAL EXPLORATION  1999   TONSILLECTOMY AND ADENOIDECTOMY  1962    Allergies: Allergies as of 09/18/2023   (No  Known Allergies)    Medications: Outpatient Encounter Medications as of 09/18/2023  Medication Sig   cyclobenzaprine (FLEXERIL) 5 MG tablet Take 1 tablet (5 mg total) by mouth 3 (three) times daily as needed for muscle spasms. (Patient not taking: Reported on 08/28/2023)   DULoxetine (CYMBALTA) 30 MG capsule Take 1 capsule (30 mg total) by mouth 2 (two) times daily.   fluticasone (FLONASE) 50 MCG/ACT nasal spray Place 2 sprays into both nostrils daily.   gabapentin (NEURONTIN) 300 MG capsule Take 1 capsule (300 mg total) by mouth 3 (three) times daily.   meloxicam (MOBIC) 7.5 MG tablet Take 1 tablet (7.5 mg total) by mouth daily.   Vitamin D, Ergocalciferol, (DRISDOL) 1.25 MG (50000 UNIT) CAPS capsule Take 1 capsule (50,000 Units total) by mouth every 7 (seven) days.   No facility-administered encounter medications on file as of 09/18/2023.    Social History: Social History   Tobacco Use   Smoking status: Former    Current packs/day: 0.00    Average packs/day: 1.5 packs/day for 34.0 years (51.0 ttl pk-yrs)    Types: Cigarettes    Start date: 28    Quit date: 2020    Years since quitting: 5.0   Smokeless tobacco: Never   Tobacco comments:    been given a Rx for Chantix quit for 1 yr and started back  Vaping Use   Vaping status: Never Used  Substance Use Topics   Alcohol use: Yes    Comment: rare  Drug use: No    Family Medical History: Family History  Problem Relation Age of Onset   Cancer Mother        colon   Osteoporosis Mother    Hyperlipidemia Mother    Alzheimer's disease Father    Thyroid disease Son    Heart disease Brother     Physical Examination: There were no vitals filed for this visit.  General: Patient is well developed, well nourished, calm, collected, and in no apparent distress. Attention to examination is appropriate.  Respiratory: Patient is breathing without any difficulty.   NEUROLOGICAL:     Awake, alert, oriented to person, place, and  time.  Speech is clear and fluent. Fund of knowledge is appropriate.   Cranial Nerves: Pupils equal round and reactive to light.  Facial tone is symmetric.    *** ROM of cervical spine *** pain *** posterior cervical tenderness. *** tenderness in bilateral trapezial region.   *** ROM of lumbar spine *** pain *** posterior lumbar tenderness.   No abnormal lesions on exposed skin.   Strength: Side Biceps Triceps Deltoid Interossei Grip Wrist Ext. Wrist Flex.  R 5 5 5 5 5 5 5   L 5 5 5 5 5 5 5    Side Iliopsoas Quads Hamstring PF DF EHL  R 5 5 5 5 5 5   L 5 5 5 5 5 5    Reflexes are ***2+ and symmetric at the biceps, brachioradialis, patella and achilles.   Hoffman's is absent.  Clonus is not present.   Bilateral upper and lower extremity sensation is intact to light touch.     Gait is normal.   ***No difficulty with tandem gait.    Medical Decision Making  Imaging: ***  I have personally reviewed the images and agree with the above interpretation.  Assessment and Plan: Jenna Meyers is a pleasant 68 y.o. female has ***  Treatment options discussed with patient and following plan made:   - Order for physical therapy for *** spine ***. Patient to call to schedule appointment. *** - Continue current medications including ***. Reviewed dosing and side effects.  - Prescription for ***. Reviewed dosing and side effects. Take with food.  - Prescription for *** to take prn muscle spasms. Reviewed dosing and side effects. Discussed this can cause drowsiness.  - MRI of *** to further evaluate *** radiculopathy. No improvement time or medications (***).  - Referral to PMR at [facility] to discuss possible *** injections.  - Will schedule phone visit to review MRI results once I get them back.   I spent a total of *** minutes in face-to-face and non-face-to-face activities related to this patient's care today including review of outside records, review of imaging, review of symptoms, physical exam,  discussion of differential diagnosis, discussion of treatment options, and documentation.   Thank you for involving me in the care of this patient.   [name] PA-C Dept. of Neurosurgery

## 2023-09-17 ENCOUNTER — Other Ambulatory Visit: Payer: Self-pay

## 2023-09-17 ENCOUNTER — Inpatient Hospital Stay
Admission: RE | Admit: 2023-09-17 | Discharge: 2023-09-17 | Disposition: A | Discharge status: Home / Self Care | Payer: Self-pay | Source: Ambulatory Visit | Attending: Orthopedic Surgery | Admitting: Orthopedic Surgery

## 2023-09-17 ENCOUNTER — Encounter: Payer: Medicare PPO | Admitting: Acute Care

## 2023-09-17 DIAGNOSIS — Z049 Encounter for examination and observation for unspecified reason: Secondary | ICD-10-CM

## 2023-09-17 NOTE — Progress Notes (Signed)
 Attempted to reach pt three times.  No answer.  LMOM to call office to get [facility] phone call rescheduled prior to CT scan on 09-25-23

## 2023-09-18 ENCOUNTER — Ambulatory Visit: Payer: Medicare PPO | Admitting: Orthopedic Surgery

## 2023-09-18 ENCOUNTER — Encounter: Payer: Self-pay | Admitting: Family Medicine

## 2023-09-18 ENCOUNTER — Ambulatory Visit: Payer: Medicare PPO | Admitting: Family Medicine

## 2023-09-18 VITALS — BP 120/68 | HR 73 | Resp 16 | Ht 64.0 in | Wt 177.0 lb

## 2023-09-18 DIAGNOSIS — Z820 Family history of epilepsy and other diseases of the nervous system: Secondary | ICD-10-CM | POA: Diagnosis not present

## 2023-09-18 DIAGNOSIS — R413 Other amnesia: Secondary | ICD-10-CM

## 2023-09-18 DIAGNOSIS — E782 Mixed hyperlipidemia: Secondary | ICD-10-CM | POA: Diagnosis not present

## 2023-09-18 DIAGNOSIS — J3489 Other specified disorders of nose and nasal sinuses: Secondary | ICD-10-CM | POA: Diagnosis not present

## 2023-09-18 MED ORDER — PSEUDOEPHEDRINE HCL 30 MG PO TABS: 30.0000 mg | ORAL_TABLET | ORAL | Status: DC | PRN

## 2023-09-18 MED ORDER — LORATADINE 10 MG PO TABS: 10.0000 mg | ORAL_TABLET | Freq: Every day | ORAL | 1 refills | Status: DC

## 2023-09-18 NOTE — Progress Notes (Signed)
Patient ID: Jenna Meyers, female    DOB: February 16, 1956, 68 y.o.   MRN: [id number]  PCP: Danelle Berry, PA-C  Chief Complaint  Patient presents with   Sinusitis    Was given antibiotics and went away, but has came back.    Subjective:   Jenna Meyers is a 68 y.o. female, presents to clinic with CC of the following:  HPI  Continued and returned sinus pressure and teeth pain on the right side (right upper molars/right maxillary sinus) it improved a little bit on antibiotics and she has continued flonase She never tried saline nasal spray antihistamines or decongestants as previously discussed She does not have any focal tooth caries injuries or tenderness that she knows of, no ear pain cough fever sweats chills lymphadenopathy She was seen about 2 weeks ago and was given antibiotics for 7 days she noted some improvement but overall the tenderness to the side of her face with pressure started about 4 to 5 weeks ago and has not yet resolved  She also is concerned about doing blood work however she just had blood work about 1 month ago we reviewed it today including reviewed her cholesterol labs over the past several years She is concerned that where she lives now with a chef she is eating more unhealthy foods, she was previously on a statin and what was a former smoker, reviewed her labs and ASCVD risk score today She is also exercising much more than she has for the past several years HLD not currently on statin, she has been on it previously The 10-year ASCVD risk score (Arnett DK, et al., 2019) is: 5.7%   Values used to calculate the score:     Age: 22 years     Sex: Female     Is Non-Hispanic African American: No     Diabetic: No     Tobacco smoker: No     Systolic Blood Pressure: 120 mmHg     Is BP treated: No     HDL Cholesterol: 72 mg/dL     Total Cholesterol: 266 mg/dL    Patient Active Problem List   Diagnosis Date Noted   Encounter for screening for malignant  neoplasm of lung in former smoker who quit in past 15 years with 30 pack year history or greater 08/28/2023   Memory problem 08/15/2022   Family history of Alzheimer's disease 08/15/2022   Mild episode of recurrent major depressive disorder (HCC) 09/26/2021   Hyperlipidemia 08/12/2019   Vitamin D deficiency disease 08/12/2019   Hallucinations, visual 03/09/2019   Moderate episode of recurrent major depressive disorder (HCC) 04/14/2018   Aortic insufficiency 03/19/2018   LVH (left ventricular hypertrophy) 03/19/2018   Grade I diastolic dysfunction 03/19/2018   Mitral regurgitation 03/19/2018   Lumbar canal stenosis 09/14/2017   Emphysema of lung (HCC) 05/31/2017   Palpitations 05/30/2017   Medication monitoring encounter 02/09/2016   Essential hypertension, benign 05/17/2015   Acute allergic reaction 05/17/2015   Situational stress 05/17/2015   Obesity 03/03/2015   Abnormal thyroid blood test 03/03/2015   History of tobacco abuse 03/03/2015   Lumbago    Fibromyalgia    Anxiety 10/09/2012      Current Outpatient Medications:    DULoxetine (CYMBALTA) 30 MG capsule, Take 1 capsule (30 mg total) by mouth 2 (two) times daily., Disp: 180 capsule, Rfl: 1   fluticasone (FLONASE) 50 MCG/ACT nasal spray, Place 2 sprays into both nostrils daily., Disp: 16 g, Rfl: 6   gabapentin (  NEURONTIN) 300 MG capsule, Take 1 capsule (300 mg total) by mouth 3 (three) times daily., Disp: 270 capsule, Rfl: 1   meloxicam (MOBIC) 7.5 MG tablet, Take 1 tablet (7.5 mg total) by mouth daily., Disp: 30 tablet, Rfl: 0   Vitamin D, Ergocalciferol, (DRISDOL) 1.25 MG (50000 UNIT) CAPS capsule, Take 1 capsule (50,000 Units total) by mouth every 7 (seven) days., Disp: 12 capsule, Rfl: 0   cyclobenzaprine (FLEXERIL) 5 MG tablet, Take 1 tablet (5 mg total) by mouth 3 (three) times daily as needed for muscle spasms. (Patient not taking: Reported on 08/13/2023), Disp: 30 tablet, Rfl: 1   No Known Allergies   Social  History   Tobacco Use   Smoking status: Former    Current packs/day: 0.00    Average packs/day: 1.5 packs/day for 34.0 years (51.0 ttl pk-yrs)    Types: Cigarettes    Start date: 9    Quit date: 2020    Years since quitting: 5.0   Smokeless tobacco: Never   Tobacco comments:    been given a Rx for Chantix quit for 1 yr and started back  Vaping Use   Vaping status: Never Used  Substance Use Topics   Alcohol use: Yes    Comment: rare   Drug use: No      Chart Review Today: I personally reviewed active problem list, medication list, allergies, family history, social history, health maintenance, notes from last encounter, lab results, imaging with the patient/caregiver today.   Review of Systems  Constitutional: Negative.   HENT: Negative.    Eyes: Negative.   Respiratory: Negative.    Cardiovascular: Negative.   Gastrointestinal: Negative.   Endocrine: Negative.   Genitourinary: Negative.   Musculoskeletal: Negative.   Skin: Negative.   Allergic/Immunologic: Negative.   Neurological: Negative.   Hematological: Negative.   Psychiatric/Behavioral: Negative.    All other systems reviewed and are negative.      Objective:   Vitals:   09/18/23 0857  BP: 120/68  Pulse: 73  Resp: 16  SpO2: 96%  Weight: 177 lb (80.3 kg)  Height: 5\' 4"  (1.626 m)    Body mass index is 30.38 kg/m.  Physical Exam Vitals and nursing note reviewed.  Constitutional:      General: She is not in acute distress.    Appearance: Normal appearance. She is well-developed. She is not ill-appearing, toxic-appearing or diaphoretic.  HENT:     Head: Normocephalic and atraumatic.     Nose: Mucosal edema and congestion present. No rhinorrhea.     Right Turbinates: Swollen. Not pale.     Left Turbinates: Swollen. Not pale.     Right Sinus: No maxillary sinus tenderness or frontal sinus tenderness.     Left Sinus: No maxillary sinus tenderness or frontal sinus tenderness.     Comments: Mild  to moderate nasal mucosa and nasal turbinate congestion, edema and erythema without any observed discharge No sinus ttp Eyes:     General:        Right eye: No discharge.        Left eye: No discharge.     Conjunctiva/sclera: Conjunctivae normal.  Neck:     Trachea: Trachea and phonation normal. No tracheal deviation.  Cardiovascular:     Rate and Rhythm: Normal rate and regular rhythm.  Pulmonary:     Effort: Pulmonary effort is normal. No respiratory distress.     Breath sounds: No stridor.  Musculoskeletal:        General: Normal  range of motion.     Cervical back: Full passive range of motion without pain.  Lymphadenopathy:     Cervical: No cervical adenopathy.  Skin:    General: Skin is warm and dry.     Findings: No rash.  Neurological:     Mental Status: She is alert.     Motor: No abnormal muscle tone.     Coordination: Coordination normal.  Psychiatric:        Behavior: Behavior normal.      Results for orders placed or performed in visit on 06/26/23  Cologuard   Collection Time: 07/15/23 11:20 AM  Result Value Ref Range   COLOGUARD Negative Negative  CBC w/Diff/Platelet   Collection Time: 08/13/23 11:21 AM  Result Value Ref Range   WBC 6.9 3.8 - 10.8 Thousand/uL   RBC 4.07 3.80 - 5.10 Million/uL   Hemoglobin 12.5 11.7 - 15.5 g/dL   HCT 40.9 81.1 - 91.4 %   MCV 93.4 80.0 - 100.0 fL   MCH 30.7 27.0 - 33.0 pg   MCHC 32.9 32.0 - 36.0 g/dL   RDW 78.2 95.6 - 21.3 %   Platelets 346 140 - 400 Thousand/uL   MPV 10.4 7.5 - 12.5 fL   Neutro Abs 4,188 1,500 - 7,800 cells/uL   Absolute Lymphocytes 2,118 850 - 3,900 cells/uL   Absolute Monocytes 414 200 - 950 cells/uL   Eosinophils Absolute 131 15 - 500 cells/uL   Basophils Absolute 48 0 - 200 cells/uL   Neutrophils Relative % 60.7 %   Total Lymphocyte 30.7 %   Monocytes Relative 6.0 %   Eosinophils Relative 1.9 %   Basophils Relative 0.7 %  COMPLETE METABOLIC PANEL WITH GFR   Collection Time: 08/13/23 11:21 AM   Result Value Ref Range   Glucose, Bld 82 65 - 99 mg/dL   BUN 15 7 - 25 mg/dL   Creat 0.86 5.78 - 4.69 mg/dL   eGFR 65 > OR = 60 GE/XBM/8.41L2   BUN/Creatinine Ratio SEE NOTE: 6 - 22 (calc)   Sodium 138 135 - 146 mmol/L   Potassium 4.7 3.5 - 5.3 mmol/L   Chloride 106 98 - 110 mmol/L   CO2 24 20 - 32 mmol/L   Calcium 9.3 8.6 - 10.4 mg/dL   Total Protein 6.9 6.1 - 8.1 g/dL   Albumin 4.1 3.6 - 5.1 g/dL   Globulin 2.8 1.9 - 3.7 g/dL (calc)   AG Ratio 1.5 1.0 - 2.5 (calc)   Total Bilirubin 0.4 0.2 - 1.2 mg/dL   Alkaline phosphatase (APISO) 64 37 - 153 U/L   AST 15 10 - 35 U/L   ALT 12 6 - 29 U/L  TSH   Collection Time: 08/13/23 11:21 AM  Result Value Ref Range   TSH 1.96 0.40 - 4.50 mIU/L  Vitamin D (25 hydroxy)   Collection Time: 08/13/23 11:21 AM  Result Value Ref Range   Vit D, 25-Hydroxy 23 (L) 30 - 100 ng/mL  B12 and Folate Panel   Collection Time: 08/13/23 11:21 AM  Result Value Ref Range   Vitamin B-12 257 200 - 1,100 pg/mL   Folate 16.3 ng/mL  Lipid Profile   Collection Time: 08/13/23 11:21 AM  Result Value Ref Range   Cholesterol 266 (H) <200 mg/dL   HDL 72 > OR = 50 mg/dL   Triglycerides 440 <102 mg/dL   LDL Cholesterol (Calc) 172 (H) mg/dL (calc)   Total CHOL/HDL Ratio 3.7 <5.0 (calc)   Non-HDL Cholesterol (  Calc) 194 (H) <130 mg/dL (calc)  Magnesium   Collection Time: 08/13/23 11:21 AM  Result Value Ref Range   Magnesium 2.2 1.5 - 2.5 mg/dL       Assessment & Plan:   1. Tenderness over maxillary sinus (Primary) Onset 4-5 weeks ago, mild improvement with round of abx and starting flonase Patient advised to also try an antihistamine, use Sudafed and try a saline nasal spray in addition to her Flonase There is no significant sinus tenderness to palpation on exam She has some persistent nasal mucosal erythema congestion and edema but it is better than a few weeks ago Encouraged her to see if she is due for dental evaluation she may also want to get her right  upper molars and dentition checked If she does not have improvement over the next couple weeks did discuss possibly trying a second round of antibiotics though I would not like to unless she has signs and symptoms concerning for bacterial infection and otherwise we could possibly get some imaging or refer her to ENT  2. Mixed hyperlipidemia Lab Results  Component Value Date   CHOL 266 (H) 08/13/2023   CHOL 173 07/19/2021   CHOL 215 (H) 01/31/2021   Lab Results  Component Value Date   HDL 72 08/13/2023   HDL 55 07/19/2021   HDL 50 01/31/2021   Lab Results  Component Value Date   LDLCALC 172 (H) 08/13/2023   LDLCALC 97 07/19/2021   LDLCALC 136 (H) 01/31/2021   Lab Results  Component Value Date   TRIG 100 08/13/2023   TRIG 111 07/19/2021   TRIG 155 (H) 01/31/2021   Lab Results  Component Value Date   CHOLHDL 3.7 08/13/2023   CHOLHDL 3.1 07/19/2021   CHOLHDL 4.3 01/31/2021   The 10-year ASCVD risk score (Arnett DK, et al., 2019) is: 5.7%   Values used to calculate the score:     Age: 46 years     Sex: Female     Is Non-Hispanic African American: No     Diabetic: No     Tobacco smoker: No     Systolic Blood Pressure: 120 mmHg     Is BP treated: No     HDL Cholesterol: 72 mg/dL     Total Cholesterol: 266 mg/dL  We reviewed the patient's labs over the last several years, her ASCVD risk score and other diet and lifestyle efforts and at this time her current ASCVD risk was 5.7% In the past she was a smoker so her risk was much higher and she had previously been on statin medications with a significant improvement in her lipids and specifically LDL I explained to her that typically when the score is 7.5% or higher we start statin that at this time she is not extremely high risk and she can continue to work on diet and lifestyle efforts including increasing vegetables whole grains fiber in her diet and decreasing saturated and trans fats and processed foods and continuing to  exercise We will do CPE in about 6 months from her last labs and recheck them and again f/up on risk/diet/lifestyle etc - this reassured pt today  3. Memory problem We discussed several things during her visit and I walked her out of the office she immediately forgot that we just discussed adjusting her appointment time and changing that she has been referred to neurology multiple times and I do think her short-term working memory is impaired and likely the neuro evaluation and possibly some  medications would be helpful to her Referral over a year old - ordered neuro referral again today, will have CMA call pt to tell them anout the consult and give her the contact info for appt at [facility] neuro-  - Ambulatory referral to Neurology  4. Family history of Alzheimer's disease - Ambulatory referral to Neurology  Return for change CPE to June (6 months from last labs), f/up as needed sinus pain 2-3 weeks.     Danelle Berry, PA-C 09/18/23 9:08 AM

## 2023-09-18 NOTE — Patient Instructions (Signed)
Keep taking flonase, add a saline nose spray to help moisturize and rinse Add sudafed which may reduce the pressure and discomfort Also recommend trying to add pepcid or claritin/zyrtec once a day to block some histamine and see if that helps Let me know if not getting better in the next 1-2 weeks and if not we need to try something different     Health Maintenance  Topic Date Due   DEXA scan (bone density measurement)  Never done   Medicare Annual Wellness Visit  05/11/2021   Screening for Lung Cancer  06/17/2021   Mammogram  09/03/2024   DTaP/Tdap/Td vaccine (2 - Td or Tdap) 09/02/2025   Cologuard (Stool DNA test)  07/14/2026   Pneumonia Vaccine  Completed   Flu Shot  Completed   Hepatitis C Screening  Completed   Zoster (Shingles) Vaccine  Completed   HPV Vaccine  Aged Out   COVID-19 Vaccine  Discontinued   Recommend getting the RSV vaccine from a pharmacy

## 2023-09-25 ENCOUNTER — Ambulatory Visit: Payer: Medicare PPO

## 2023-09-25 ENCOUNTER — Ambulatory Visit
Admission: RE | Admit: 2023-09-25 | Discharge: 2023-09-25 | Disposition: A | Discharge status: Home / Self Care | Payer: Medicare PPO | Source: Ambulatory Visit | Attending: Acute Care | Admitting: Acute Care

## 2023-09-25 DIAGNOSIS — Z87891 Personal history of nicotine dependence: Secondary | ICD-10-CM | POA: Diagnosis present

## 2023-09-25 DIAGNOSIS — Z122 Encounter for screening for malignant neoplasm of respiratory organs: Secondary | ICD-10-CM | POA: Insufficient documentation

## 2023-10-02 ENCOUNTER — Encounter: Payer: Self-pay | Admitting: Family Medicine

## 2023-10-02 ENCOUNTER — Ambulatory Visit: Payer: Self-pay

## 2023-10-02 ENCOUNTER — Telehealth: Payer: Medicare PPO | Admitting: Family Medicine

## 2023-10-02 DIAGNOSIS — J069 Acute upper respiratory infection, unspecified: Secondary | ICD-10-CM

## 2023-10-02 DIAGNOSIS — Z20822 Contact with and (suspected) exposure to covid-19: Secondary | ICD-10-CM

## 2023-10-02 NOTE — Progress Notes (Signed)
 Name: Jenna Meyers   MRN: [id number]    DOB: January 22, 1956   Date:10/02/2023       Progress Note  Subjective:    Chief Complaint  Chief Complaint  Patient presents with   Cough    Non-productive, started today   Covid Exposure    I connected with  Kathrin Penner  on 10/02/23 at  8:40 AM EST by a video enabled telemedicine application and verified that I am speaking with the correct person using two identifiers.  I discussed the limitations of evaluation and management by telemedicine and the availability of in person appointments. The patient expressed understanding and agreed to proceed. Staff also discussed with the patient that there may be a patient responsible charge related to this service. Patient Location: home Provider Location: [facility] Additional Individuals present: none  Cough This is a new problem. The current episode started today. The problem has been unchanged. The cough is Non-productive. Pertinent negatives include no chest pain, chills, ear congestion, ear pain, fever, headaches, heartburn, hemoptysis, myalgias, nasal congestion, postnasal drip, rash, rhinorrhea, sore throat, shortness of breath, sweats, weight loss or wheezing. Nothing aggravates the symptoms. She has tried nothing for the symptoms. Her past medical history is significant for emphysema.   Possible exposure to COVID in her facility, they isolate residents, she has not tested yet She is UTD on all vaccinations      Patient Active Problem List   Diagnosis Date Noted   Encounter for screening for malignant neoplasm of lung in former smoker who quit in past 15 years with 30 pack year history or greater 08/28/2023   Memory problem 08/15/2022   Family history of Alzheimer's disease 08/15/2022   Mild episode of recurrent major depressive disorder (HCC) 09/26/2021   Hyperlipidemia 08/12/2019   Vitamin D deficiency disease 08/12/2019   Hallucinations, visual 03/09/2019   Moderate episode of recurrent  major depressive disorder (HCC) 04/14/2018   Aortic insufficiency 03/19/2018   LVH (left ventricular hypertrophy) 03/19/2018   Grade I diastolic dysfunction 03/19/2018   Mitral regurgitation 03/19/2018   Lumbar canal stenosis 09/14/2017   Emphysema of lung (HCC) 05/31/2017   Palpitations 05/30/2017   Medication monitoring encounter 02/09/2016   Essential hypertension, benign 05/17/2015   Acute allergic reaction 05/17/2015   Situational stress 05/17/2015   Obesity 03/03/2015   Abnormal thyroid blood test 03/03/2015   History of tobacco abuse 03/03/2015   Lumbago    Fibromyalgia    Anxiety 10/09/2012    Social History   Tobacco Use   Smoking status: Former    Current packs/day: 0.00    Average packs/day: 1.5 packs/day for 34.0 years (51.0 ttl pk-yrs)    Types: Cigarettes    Start date: 70    Quit date: 2020    Years since quitting: 5.1   Smokeless tobacco: Never   Tobacco comments:    been given a Rx for Chantix quit for 1 yr and started back  Substance Use Topics   Alcohol use: Yes    Comment: rare     Current Outpatient Medications:    DULoxetine (CYMBALTA) 30 MG capsule, Take 1 capsule (30 mg total) by mouth 2 (two) times daily., Disp: 180 capsule, Rfl: 1   fluticasone (FLONASE) 50 MCG/ACT nasal spray, Place 2 sprays into both nostrils daily., Disp: 16 g, Rfl: 6   gabapentin (NEURONTIN) 300 MG capsule, Take 1 capsule (300 mg total) by mouth 3 (three) times daily., Disp: 270 capsule, Rfl: 1   loratadine (CLARITIN) 10  MG tablet, Take 1 tablet (10 mg total) by mouth daily., Disp: 90 tablet, Rfl: 1   meloxicam (MOBIC) 7.5 MG tablet, Take 1 tablet (7.5 mg total) by mouth daily., Disp: 30 tablet, Rfl: 0   pseudoephedrine (SUDAFED) 30 MG tablet, Take 1 tablet (30 mg total) by mouth every 4 (four) hours as needed for congestion., Disp: , Rfl:    Vitamin D, Ergocalciferol, (DRISDOL) 1.25 MG (50000 UNIT) CAPS capsule, Take 1 capsule (50,000 Units total) by mouth every 7 (seven)  days., Disp: 12 capsule, Rfl: 0  No Known Allergies  I personally reviewed active problem list, medication list, allergies, family history, social history, health maintenance, notes from last encounter, lab results, imaging with the patient/caregiver today.   Review of Systems  Constitutional: Negative.  Negative for activity change, appetite change, chills, diaphoresis, fatigue, fever and weight loss.  HENT:  Positive for voice change. Negative for congestion, ear discharge, ear pain, postnasal drip, rhinorrhea, sinus pressure, sinus pain, sneezing, sore throat and trouble swallowing.   Eyes: Negative.   Respiratory:  Positive for cough. Negative for hemoptysis, choking, chest tightness, shortness of breath, wheezing and stridor.   Cardiovascular: Negative.  Negative for chest pain.  Gastrointestinal: Negative.  Negative for heartburn.  Endocrine: Negative.   Genitourinary: Negative.   Musculoskeletal: Negative.  Negative for myalgias.  Skin: Negative.  Negative for rash.  Allergic/Immunologic: Negative.   Neurological: Negative.  Negative for headaches.  Hematological: Negative.   Psychiatric/Behavioral: Negative.    All other systems reviewed and are negative.     Objective:   Virtual encounter, vitals limited, only able to obtain the following There were no vitals filed for this visit. There is no height or weight on file to calculate BMI. Nursing Note and Vital Signs reviewed.  Physical Exam Vitals and nursing note reviewed.  Constitutional:      General: She is not in acute distress.    Appearance: Normal appearance. She is not ill-appearing, toxic-appearing or diaphoretic.  Pulmonary:     Effort: Pulmonary effort is normal. No tachypnea, accessory muscle usage, prolonged expiration, respiratory distress or retractions.     Comments: Scratchy phonation No audible stridor or wheeze Speaking in full and complete sentences Neurological:     Mental Status: She is alert.   Psychiatric:        Mood and Affect: Mood normal.        Behavior: Behavior normal.     PE limited by virtual encounter  No results found for this or any previous visit (from the past 72 hours).  Assessment and Plan:     ICD-10-CM   1. Upper respiratory tract infection, unspecified type  J06.9     2. Exposure to COVID-19 virus  Z20.822        Woke up this morning with some scratchy voice and dry cough Otherwise feels fine COVID may be going around her facility She will get tested and let us know No wheeze, SOB, body aches, fever, HA, chills, sweats, pain with breathing She hasn't tried anything yet Last COPD/emphysema exacerbation was 2018 - so not recently needing steroids or inhalers  Advised to test for COVID - let us know results and if + she can take paxlovid I did advise her if sx changed or worsened she may need to f/up with [place] to determine what else she may need, currently all that is indicated is supportive and symptomatic care and watchful waiting - encouraged OTC cough meds and mucinex  -Red flags  and when to present for emergency care or RTC including fever >101.63F, chest pain, shortness of breath, new/worsening/un-resolving symptoms, reviewed with patient at time of visit. Follow up and care instructions discussed and provided in AVS. - I discussed the assessment and treatment plan with the patient. The patient was provided an opportunity to ask questions and all were answered. The patient agreed with the plan and demonstrated an understanding of the instructions.  I provided 15 minutes of non-face-to-face time during this encounter.  Danelle Berry, PA-C 10/02/23 8:46 AM

## 2023-10-02 NOTE — Telephone Encounter (Signed)
      Chief Complaint: Non-productive cough, runny nose, exposure to COVID, has not tested. Symptoms: Above Frequency: Today Pertinent Negatives: Patient denies fever Disposition: [] ED /[] Urgent Care (no appt availability in office) / [x] Appointment(In office/virtual)/ []  [facility]/ [] Home Care/ [] Refused Recommended Disposition /[] [facility]/ []  Follow-up with PCP Additional Notes: Agrees with appointment.  Reason for Disposition  [1] MILD difficulty breathing (e.g., minimal/no SOB at rest, SOB with walking, pulse <100) AND [2] still present when not coughing  Answer Assessment - Initial Assessment Questions 1. ONSET: "When did the cough begin?"      Today 2. SEVERITY: "How bad is the cough today?"      Severe 3. SPUTUM: "Describe the color of your sputum" (none, dry cough; clear, white, yellow, green)     None 4. HEMOPTYSIS: "Are you coughing up any blood?" If so ask: "How much?" (flecks, streaks, tablespoons, etc.)     No 5. DIFFICULTY BREATHING: "Are you having difficulty breathing?" If Yes, ask: "How bad is it?" (e.g., mild, moderate, severe)    - MILD: No SOB at rest, mild SOB with walking, speaks normally in sentences, can lie down, no retractions, pulse < 100.    - MODERATE: SOB at rest, SOB with minimal exertion and prefers to sit, cannot lie down flat, speaks in phrases, mild retractions, audible wheezing, pulse 100-120.    - SEVERE: Very SOB at rest, speaks in single words, struggling to breathe, sitting hunched forward, retractions, pulse > 120      Mild 6. FEVER: "Do you have a fever?" If Yes, ask: "What is your temperature, how was it measured, and when did it start?"     No 7. CARDIAC HISTORY: "Do you have any history of heart disease?" (e.g., heart attack, congestive heart failure)      No 8. LUNG HISTORY: "Do you have any history of lung disease?"  (e.g., pulmonary embolus, asthma, emphysema)     COPD 9. PE RISK FACTORS: "Do you have a  history of blood clots?" (or: recent major surgery, recent prolonged travel, bedridden)     nO 10. OTHER SYMPTOMS: "Do you have any other symptoms?" (e.g., runny nose, wheezing, chest pain)       Runny nose, fatigue 11. PREGNANCY: "Is there any chance you are pregnant?" "When was your last menstrual period?"       No 12. TRAVEL: "Have you traveled out of the country in the last month?" (e.g., travel history, exposures)       No  Protocols used: Cough - Acute Non-Productive-A-AH

## 2023-10-04 ENCOUNTER — Other Ambulatory Visit: Payer: Self-pay

## 2023-10-04 DIAGNOSIS — Z87891 Personal history of nicotine dependence: Secondary | ICD-10-CM

## 2023-10-04 DIAGNOSIS — Z122 Encounter for screening for malignant neoplasm of respiratory organs: Secondary | ICD-10-CM

## 2023-10-09 ENCOUNTER — Telehealth: Payer: Self-pay | Admitting: Family Medicine

## 2023-10-09 ENCOUNTER — Encounter: Payer: Medicare PPO | Admitting: Family Medicine

## 2023-10-09 NOTE — Telephone Encounter (Signed)
 I spoke to patient and scheduled AWV on 12/20/2023 at 3:40.

## 2023-10-09 NOTE — Telephone Encounter (Signed)
 Pt is needing to reschedule her AWV that she had cancelled. Please give her a call [phone]. Thanks

## 2023-11-06 ENCOUNTER — Ambulatory Visit: Payer: Self-pay | Admitting: Family Medicine

## 2023-11-06 NOTE — Telephone Encounter (Signed)
 Chronic lower back pain Symptoms: bilateral hip pain Frequency: Constant now Pertinent Negatives: Patient denies pain radiation, numbness, abdomen pain Disposition: [x] Appointment(In office) Additional Notes: Pt has ongoing chronic lower back pain but it is getting worse. Pt scheduled for an appt tmrw morning with PCP. This RN educated pt on home care, new-worsening symptoms, when to call back/seek emergent care. Pt verbalized understanding and agrees to plan.  Copied from CRM [id number]. Topic: Clinical - Red Word Triage >> Nov 06, 2023 10:29 AM Elle L wrote: Red Word that prompted transfer to Nurse Triage: The patient states she has been having severe lower back pain. Reason for Disposition  Back pain present > 2 weeks  Answer Assessment - Initial Assessment Questions Chronic lower back pain for "many years"  Bilateral hip pain associated with this 4-5/10 pain level, mostly constant now Denies pain radiation, numbness, abdomen pain  Protocols used: Back Pain-A-AH

## 2023-11-07 ENCOUNTER — Encounter: Payer: Self-pay | Admitting: Family Medicine

## 2023-11-07 ENCOUNTER — Ambulatory Visit: Admitting: Family Medicine

## 2023-11-07 VITALS — BP 126/74 | HR 83 | Resp 16 | Ht 64.0 in | Wt 181.0 lb

## 2023-11-07 DIAGNOSIS — M25511 Pain in right shoulder: Secondary | ICD-10-CM

## 2023-11-07 DIAGNOSIS — M25512 Pain in left shoulder: Secondary | ICD-10-CM

## 2023-11-07 DIAGNOSIS — E559 Vitamin D deficiency, unspecified: Secondary | ICD-10-CM

## 2023-11-07 DIAGNOSIS — M797 Fibromyalgia: Secondary | ICD-10-CM

## 2023-11-07 DIAGNOSIS — M545 Low back pain, unspecified: Secondary | ICD-10-CM

## 2023-11-07 DIAGNOSIS — M25552 Pain in left hip: Secondary | ICD-10-CM

## 2023-11-07 DIAGNOSIS — G5793 Unspecified mononeuropathy of bilateral lower limbs: Secondary | ICD-10-CM

## 2023-11-07 DIAGNOSIS — R195 Other fecal abnormalities: Secondary | ICD-10-CM

## 2023-11-07 DIAGNOSIS — G8929 Other chronic pain: Secondary | ICD-10-CM | POA: Diagnosis not present

## 2023-11-07 DIAGNOSIS — R198 Other specified symptoms and signs involving the digestive system and abdomen: Secondary | ICD-10-CM

## 2023-11-07 MED ORDER — MELOXICAM 7.5 MG PO TABS: 7.5000 mg | ORAL_TABLET | Freq: Every day | ORAL | 1 refills | Status: DC

## 2023-11-07 MED ORDER — GABAPENTIN 300 MG PO CAPS: 300.0000 mg | ORAL_CAPSULE | Freq: Three times a day (TID) | ORAL | 1 refills | Status: DC

## 2023-11-07 NOTE — Patient Instructions (Addendum)
 Team Member Role and Specialty Contact Info Address Start End Comments  Reinaldo Berber, MD Consulting Physician (Orthopedic Surgery) Phone: [phone] Fax: [phone] [address] [place] [address] 11/07/2023 - -  Call Ortho to get an appt for the injections I have sent in meloxicam to add to your daily meds  Last vitamin D Lab Results  Component Value Date   VD25OH 23 (L) 08/13/2023   Start taking over the counter Vit D 3 1000-2000 international unit  Calcium  [phone] mg in supplements and diet

## 2023-11-07 NOTE — Progress Notes (Signed)
 Patient ID: Jenna Meyers, female    DOB: [birth year], 68 y.o.   MRN: [id number]  PCP: Danelle Berry, PA-C  Chief Complaint  Patient presents with   Back Pain    Going on for a while. Broke tail bone twice- reoccurring pain    Subjective:   Jenna Meyers is a 68 y.o. female, presents to clinic with CC of the following:  HPI  Here for low back/coxxyx pain and hip pain  she has recently consulted with Dr. Audelia Acton for the same (Jan 2025 at [place]) Hx of chronic low back pain lumbar facet arthropathy per specialists recent eval/note/imaging and hx of FMS  On cymbalta and gabapentin and wants to get refill on meloxicam   She has questions about weight gain, but she has noted she's built muscle being more active daily Wt Readings from Last 5 Encounters:  11/07/23 181 lb (82.1 kg)  09/18/23 177 lb (80.3 kg)  08/28/23 176 lb (79.8 kg)  08/13/23 170 lb 3.2 oz (77.2 kg)  08/02/23 159 lb (72.1 kg)   BMI Readings from Last 5 Encounters:  11/07/23 31.07 kg/m  09/18/23 30.38 kg/m  08/28/23 30.21 kg/m  08/13/23 29.21 kg/m  08/02/23 27.29 kg/m    She has concerns about loose stool frequent BM which is new for her x 6 months since moving to current residence  No abd pain, N, V, no blood in stool. She eats salads daily and fruits and vegetables, she previously had firm stools and constipation Recently did cologuard which was negative Mother with hx of CRC  Last vitamin D Lab Results  Component Value Date   VD25OH 72 (L) 08/13/2023     Patient Active Problem List   Diagnosis Date Noted   Encounter for screening for malignant neoplasm of lung in former smoker who quit in past 15 years with 30 pack year history or greater 08/28/2023   Memory problem 08/15/2022   Family history of Alzheimer's disease 08/15/2022   Mild episode of recurrent major depressive disorder (HCC) 09/26/2021   Hyperlipidemia 08/12/2019   Vitamin D deficiency disease 08/12/2019   Hallucinations,  visual 03/09/2019   Moderate episode of recurrent major depressive disorder (HCC) 04/14/2018   Aortic insufficiency 03/19/2018   LVH (left ventricular hypertrophy) 03/19/2018   Grade I diastolic dysfunction 03/19/2018   Mitral regurgitation 03/19/2018   Lumbar canal stenosis 09/14/2017   Emphysema of lung (HCC) 05/31/2017   Palpitations 05/30/2017   Medication monitoring encounter 02/09/2016   Essential hypertension, benign 05/17/2015   Acute allergic reaction 05/17/2015   Situational stress 05/17/2015   Obesity 03/03/2015   Abnormal thyroid blood test 03/03/2015   History of tobacco abuse 03/03/2015   Lumbago    Fibromyalgia    Anxiety 10/09/2012      Current Outpatient Medications:    DULoxetine (CYMBALTA) 30 MG capsule, Take 1 capsule (30 mg total) by mouth 2 (two) times daily., Disp: 180 capsule, Rfl: 1   fluticasone (FLONASE) 50 MCG/ACT nasal spray, Place 2 sprays into both nostrils daily., Disp: 16 g, Rfl: 6   gabapentin (NEURONTIN) 300 MG capsule, Take 1 capsule (300 mg total) by mouth 3 (three) times daily., Disp: 270 capsule, Rfl: 1   loratadine (CLARITIN) 10 MG tablet, Take 1 tablet (10 mg total) by mouth daily., Disp: 90 tablet, Rfl: 1   meloxicam (MOBIC) 7.5 MG tablet, Take 1 tablet (7.5 mg total) by mouth daily., Disp: 30 tablet, Rfl: 0   pseudoephedrine (SUDAFED) 30 MG tablet, Take 1 tablet (  30 mg total) by mouth every 4 (four) hours as needed for congestion., Disp: , Rfl:    Vitamin D, Ergocalciferol, (DRISDOL) 1.25 MG (50000 UNIT) CAPS capsule, Take 1 capsule (50,000 Units total) by mouth every 7 (seven) days., Disp: 12 capsule, Rfl: 0   No Known Allergies   Social History   Tobacco Use   Smoking status: Former    Current packs/day: 0.00    Average packs/day: 1.5 packs/day for 34.0 years (51.0 ttl pk-yrs)    Types: Cigarettes    Start date: 3    Quit date: 2020    Years since quitting: 5.2   Smokeless tobacco: Never   Tobacco comments:    been given  a Rx for Chantix quit for 1 yr and started back  Vaping Use   Vaping status: Never Used  Substance Use Topics   Alcohol use: Yes    Comment: rare   Drug use: No      Chart Review Today: I personally reviewed active problem list, medication list, allergies, family history, social history, health maintenance, notes from last encounter, lab results, imaging with the patient/caregiver today.   Review of Systems  Constitutional: Negative.   HENT: Negative.    Eyes: Negative.   Respiratory: Negative.    Cardiovascular: Negative.   Gastrointestinal: Negative.   Endocrine: Negative.   Genitourinary: Negative.   Musculoskeletal: Negative.   Skin: Negative.   Allergic/Immunologic: Negative.   Neurological: Negative.   Hematological: Negative.   Psychiatric/Behavioral: Negative.    All other systems reviewed and are negative.      Objective:   Vitals:   11/07/23 1028  BP: 126/74  Pulse: 83  Resp: 16  SpO2: 99%  Weight: 181 lb (82.1 kg)  Height: 5\' 4"  (1.626 m)    Body mass index is 31.07 kg/m.  Physical Exam Vitals and nursing note reviewed.  Constitutional:      General: She is not in acute distress.    Appearance: Normal appearance. She is well-developed. She is not ill-appearing, toxic-appearing or diaphoretic.  HENT:     Head: Normocephalic and atraumatic.     Nose: Nose normal.  Eyes:     General:        Right eye: No discharge.        Left eye: No discharge.     Conjunctiva/sclera: Conjunctivae normal.  Neck:     Trachea: No tracheal deviation.  Cardiovascular:     Rate and Rhythm: Normal rate and regular rhythm.  Pulmonary:     Effort: Pulmonary effort is normal. No respiratory distress.     Breath sounds: No stridor.  Abdominal:     General: Bowel sounds are normal. There is no distension.     Palpations: Abdomen is soft.     Tenderness: There is no abdominal tenderness. There is no guarding or rebound.  Skin:    General: Skin is warm and dry.      Findings: No rash.  Neurological:     Mental Status: She is alert.     Motor: No abnormal muscle tone.     Coordination: Coordination normal.  Psychiatric:        Behavior: Behavior normal.      Results for orders placed or performed in visit on 06/26/23  Cologuard   Collection Time: 07/15/23 11:20 AM  Result Value Ref Range   COLOGUARD Negative Negative  CBC w/Diff/Platelet   Collection Time: 08/13/23 11:21 AM  Result Value Ref Range   WBC 6.9  3.8 - 10.8 Thousand/uL   RBC 4.07 3.80 - 5.10 Million/uL   Hemoglobin 12.5 11.7 - 15.5 g/dL   HCT 16.1 09.6 - 04.5 %   MCV 93.4 80.0 - 100.0 fL   MCH 30.7 27.0 - 33.0 pg   MCHC 32.9 32.0 - 36.0 g/dL   RDW 40.9 81.1 - 91.4 %   Platelets 346 140 - 400 Thousand/uL   MPV 10.4 7.5 - 12.5 fL   Neutro Abs 4,188 1,500 - 7,800 cells/uL   Absolute Lymphocytes 2,118 850 - 3,900 cells/uL   Absolute Monocytes 414 200 - 950 cells/uL   Eosinophils Absolute 131 15 - 500 cells/uL   Basophils Absolute 48 0 - 200 cells/uL   Neutrophils Relative % 60.7 %   Total Lymphocyte 30.7 %   Monocytes Relative 6.0 %   Eosinophils Relative 1.9 %   Basophils Relative 0.7 %  COMPLETE METABOLIC PANEL WITH GFR   Collection Time: 08/13/23 11:21 AM  Result Value Ref Range   Glucose, Bld 82 65 - 99 mg/dL   BUN 15 7 - 25 mg/dL   Creat 7.82 9.56 - 2.13 mg/dL   eGFR 65 > OR = 60 YQ/MVH/8.46N6   BUN/Creatinine Ratio SEE NOTE: 6 - 22 (calc)   Sodium 138 135 - 146 mmol/L   Potassium 4.7 3.5 - 5.3 mmol/L   Chloride 106 98 - 110 mmol/L   CO2 24 20 - 32 mmol/L   Calcium 9.3 8.6 - 10.4 mg/dL   Total Protein 6.9 6.1 - 8.1 g/dL   Albumin 4.1 3.6 - 5.1 g/dL   Globulin 2.8 1.9 - 3.7 g/dL (calc)   AG Ratio 1.5 1.0 - 2.5 (calc)   Total Bilirubin 0.4 0.2 - 1.2 mg/dL   Alkaline phosphatase (APISO) 64 37 - 153 U/L   AST 15 10 - 35 U/L   ALT 12 6 - 29 U/L  TSH   Collection Time: 08/13/23 11:21 AM  Result Value Ref Range   TSH 1.96 0.40 - 4.50 mIU/L  Vitamin D (25  hydroxy)   Collection Time: 08/13/23 11:21 AM  Result Value Ref Range   Vit D, 25-Hydroxy 23 (L) 30 - 100 ng/mL  B12 and Folate Panel   Collection Time: 08/13/23 11:21 AM  Result Value Ref Range   Vitamin B-12 257 200 - 1,100 pg/mL   Folate 16.3 ng/mL  Lipid Profile   Collection Time: 08/13/23 11:21 AM  Result Value Ref Range   Cholesterol 266 (H) <200 mg/dL   HDL 72 > OR = 50 mg/dL   Triglycerides 295 <284 mg/dL   LDL Cholesterol (Calc) 172 (H) mg/dL (calc)   Total CHOL/HDL Ratio 3.7 <5.0 (calc)   Non-HDL Cholesterol (Calc) 194 (H) <130 mg/dL (calc)  Magnesium   Collection Time: 08/13/23 11:21 AM  Result Value Ref Range   Magnesium 2.2 1.5 - 2.5 mg/dL       Assessment & Plan:   1. Chronic left hip pain Continue cymbalta and gabapentin, f/up ortho - meloxicam (MOBIC) 7.5 MG tablet; Take 1 tablet (7.5 mg total) by mouth daily.  Dispense: 90 tablet; Refill: 1  2. Neuropathy involving both lower extremities (Primary) Neuropathy management stable on current meds  3. Fibromyalgia Better control with better med compliance and pt being more active Continue cymbalta 30 mg BID  4. Vitamin D deficiency Completed vit D rx, was instructed to start daily supplement Labs reviewed, mild deficiency, will not recheck labs today Last vitamin D Lab Results  Component Value  Date   VD25OH 58 (L) 08/13/2023  Osteoporosis prevention Vit D 3 and calcium supplements reviewed with pt and put on AVS   5. Chronic low back pain, unspecified back pain laterality, unspecified whether sciatica present F/up ortho/aberman for injections Continue PT, mobic, cymbalta, gabapentin - meloxicam (MOBIC) 7.5 MG tablet; Take 1 tablet (7.5 mg total) by mouth daily.  Dispense: 90 tablet; Refill: 1  6. Acute pain of both shoulders stable - gabapentin (NEURONTIN) 300 MG capsule; Take 1 capsule (300 mg total) by mouth 3 (three) times daily.  Dispense: 270 capsule; Refill: 1  7. Change in bowel movement    8. Loose stools She has concerns about loose stool frequent BM which is new for her x 6 months since moving to current residence  No abd pain, N, V, no blood in stool. She eats salads daily and fruits and vegetables, she previously had firm stools and constipation Recently did cologuard which was negative Mother with hx of CRC Abdominal exam unremarkable today No concerning associated sx (no weight loss, no blood in stool, abd pain, indigestion)  Likely related to drastic diet change She can consult with GI for further eval, ongoing 6 months no indication for stool testing for infection, last labs showed normal LFTs and electrolytes and blood counts Reviewed healthy diet and food high in fiber - Ambulatory referral to Gastroenterology   Return in about 3 months (around 02/07/2024) for Routine follow-up.     Danelle Berry, PA-C 11/07/23 10:24 AM

## 2023-12-04 ENCOUNTER — Other Ambulatory Visit: Payer: Self-pay | Admitting: Family Medicine

## 2023-12-04 DIAGNOSIS — G8929 Other chronic pain: Secondary | ICD-10-CM

## 2023-12-04 DIAGNOSIS — M545 Low back pain, unspecified: Secondary | ICD-10-CM

## 2023-12-04 MED ORDER — MELOXICAM 7.5 MG PO TABS: 7.5000 mg | ORAL_TABLET | Freq: Every day | ORAL | 1 refills | Status: DC

## 2023-12-04 NOTE — Telephone Encounter (Signed)
 Copied from CRM [id number]. Topic: Clinical - Medication Refill >> Dec 04, 2023  4:35 PM Tiffany S wrote: Most Recent Primary Care Visit:  Provider: Adeline Hone  Department: [facility]  Visit Type: ACUTE  Date: 11/07/2023  Medication: meloxicam (MOBIC) 7.5 MG tablet   Has the patient contacted their pharmacy? Yes (Agent: If no, request that the patient contact the pharmacy for the refill. If patient does not wish to contact the pharmacy document the reason why and proceed with request.) (Agent: If yes, when and what did the pharmacy advise?)  Is this the correct pharmacy for this prescription? Yes If no, delete pharmacy and type the correct one.  This is the patient's preferred pharmacy:  [email or web address] [address] [place], [place], [place] [address] [phone]   Has the prescription been filled recently? Yes  Is the patient out of the medication? Yes  Has the patient been seen for an appointment in the last year OR does the patient have an upcoming appointment? Yes  Can we respond through MyChart? Yes  Agent: Please be advised that Rx refills may take up to 3 business days. We ask that you follow-up with your pharmacy.

## 2023-12-04 NOTE — Telephone Encounter (Signed)
 Requested Prescriptions  Pending Prescriptions Disp Refills   meloxicam (MOBIC) 7.5 MG tablet 90 tablet 1    Sig: Take 1 tablet (7.5 mg total) by mouth daily.     Analgesics:  COX2 Inhibitors Failed - 12/04/2023  5:18 PM      Failed - Manual Review: Labs are only required if the patient has taken medication for more than 8 weeks.      Passed - HGB in normal range and within 360 days    Hemoglobin  Date Value Ref Range Status  08/13/2023 12.5 11.7 - 15.5 g/dL Final   HGB  Date Value Ref Range Status  04/17/2014 13.1 12.0 - 16.0 g/dL Final         Passed - Cr in normal range and within 360 days    Creat  Date Value Ref Range Status  08/13/2023 0.96 0.50 - 1.05 mg/dL Final         Passed - HCT in normal range and within 360 days    HCT  Date Value Ref Range Status  08/13/2023 38.0 35.0 - 45.0 % Final  04/17/2014 40.0 35.0 - 47.0 % Final         Passed - AST in normal range and within 360 days    AST  Date Value Ref Range Status  08/13/2023 15 10 - 35 U/L Final   SGOT(AST)  Date Value Ref Range Status  04/17/2014 25 15 - 37 Unit/L Final         Passed - ALT in normal range and within 360 days    ALT  Date Value Ref Range Status  08/13/2023 12 6 - 29 U/L Final   SGPT (ALT)  Date Value Ref Range Status  04/17/2014 24 U/L Final    Comment:    14-63 NOTE: New Reference Range 03/10/14          Passed - eGFR is 30 or above and within 360 days    GFR, Est African American  Date Value Ref Range Status  01/31/2021 88 > OR = 60 mL/min/1.14m2 Final   GFR, Est Non African American  Date Value Ref Range Status  01/31/2021 76 > OR = 60 mL/min/1.77m2 Final   GFR, Estimated  Date Value Ref Range Status  04/15/2022 >60 >60 mL/min Final    Comment:    (NOTE) Calculated using the CKD-EPI Creatinine Equation (2021)    eGFR  Date Value Ref Range Status  08/13/2023 65 > OR = 60 mL/min/1.40m2 Final         Passed - Patient is not pregnant      Passed - Valid  encounter within last 12 months    Recent Outpatient Visits           3 weeks ago Neuropathy involving both lower extremities   [facility] Adeline Hone, PA-C   2 months ago Upper respiratory tract infection, unspecified type   [facility] Adeline Hone, PA-C       Future Appointments             In 1 month Adeline Hone, PA-C [facility]

## 2023-12-16 ENCOUNTER — Telehealth: Payer: Self-pay | Admitting: Family Medicine

## 2023-12-16 NOTE — Telephone Encounter (Signed)
 Patient had coccygeal injection done 12/14/23, which she had no reported side effects with. Today, while walking around, she began experiencing some mild hip pain and tingling in her right leg, which she reports she has not experienced previously.  Advised continued monitoring. If symptoms persist and/or worsen, recommend proceeding to an urgent care. Otherwise, recommend follow up with [facility].

## 2023-12-20 ENCOUNTER — Ambulatory Visit (INDEPENDENT_AMBULATORY_CARE_PROVIDER_SITE_OTHER): Payer: Medicare PPO

## 2023-12-20 ENCOUNTER — Ambulatory Visit: Payer: Self-pay

## 2023-12-20 ENCOUNTER — Other Ambulatory Visit: Payer: Self-pay | Admitting: Family Medicine

## 2023-12-20 DIAGNOSIS — M25511 Pain in right shoulder: Secondary | ICD-10-CM

## 2023-12-20 NOTE — Telephone Encounter (Signed)
 Copied from CRM [id number]. Topic: Clinical - Medication Refill >> Dec 20, 2023 12:50 PM Felizardo Hotter wrote: Most Recent Primary Care Visit:  Provider: Adeline Hone  Department: [facility]  Visit Type: ACUTE  Date: 11/07/2023  Medication: gabapentin  (NEURONTIN ) 300 MG capsule  Has the patient contacted their pharmacy? Yes (Agent: If no, request that the patient contact the pharmacy for the refill. If patient does not wish to contact the pharmacy document the reason why and proceed with request.) (Agent: If yes, when and what did the pharmacy advise?)  Is this the correct pharmacy for this prescription? Yes If no, delete pharmacy and type the correct one.  This is the patient's preferred pharmacy:   [facility] [id number] [place], [place] - [address] [place] [address] Phone: [phone] Fax: [phone]  Has the prescription been filled recently? Yes  Is the patient out of the medication? Yes  Has the patient been seen for an appointment in the last year OR does the patient have an upcoming appointment? Yes  Can we respond through MyChart? Yes  Agent: Please be advised that Rx refills may take up to 3 business days. We ask that you follow-up with your pharmacy.

## 2023-12-20 NOTE — Telephone Encounter (Signed)
 Copied from CRM [id number]. Topic: Clinical - Medication Refill >> Dec 20, 2023 11:01 AM Dorthula Gavel H wrote: Most Recent Primary Care Visit:  Provider: Adeline Hone  Department: [facility]  Visit Type: ACUTE  Date: 11/07/2023  Medication: gabapentin  (NEURONTIN ) 300 MG capsule  Has the patient contacted their pharmacy? Yes (Agent: If no, request that the patient contact the pharmacy for the refill. If patient does not wish to contact the pharmacy document the reason why and proceed with request.) (Agent: If yes, when and what did the pharmacy advise?)  Is this the correct pharmacy for this prescription? Yes If no, delete pharmacy and type the correct one.  This is the patient's preferred pharmacy:    [facility] [id number] [place], [place] - [address] [place] [address] Phone: [phone] Fax: [phone]   Has the prescription been filled recently? Yes  Is the patient out of the medication? Yes  Has the patient been seen for an appointment in the last year OR does the patient have an upcoming appointment? Yes  Can we respond through MyChart? Yes  Agent: Please be advised that Rx refills may take up to 3 business days. We ask that you follow-up with your pharmacy.

## 2023-12-20 NOTE — Progress Notes (Signed)
 No charge.  [name] N. Glenna Lango, LPN [facility] Annual Wellness Team Direct Dial: [phone]

## 2023-12-21 ENCOUNTER — Ambulatory Visit: Payer: Self-pay

## 2023-12-21 ENCOUNTER — Other Ambulatory Visit: Payer: Self-pay | Admitting: Family Medicine

## 2023-12-21 ENCOUNTER — Telehealth: Payer: Self-pay

## 2023-12-21 ENCOUNTER — Other Ambulatory Visit: Payer: Self-pay | Admitting: Neurology

## 2023-12-21 DIAGNOSIS — R4189 Other symptoms and signs involving cognitive functions and awareness: Secondary | ICD-10-CM

## 2023-12-21 DIAGNOSIS — M25511 Pain in right shoulder: Secondary | ICD-10-CM

## 2023-12-21 MED ORDER — GABAPENTIN 300 MG PO CAPS: 300.0000 mg | ORAL_CAPSULE | Freq: Three times a day (TID) | ORAL | 1 refills | Status: DC

## 2023-12-21 NOTE — Telephone Encounter (Signed)
 Copied from CRM [id number]. Topic: Clinical - Prescription Issue >> Dec 21, 2023 11:38 AM Jenna Meyers wrote: Reason for CRM: Gabapentin  has not been received by the pharmacy, patient states if it is an issue with the pharmacy you can send her prescription to [facility] [address] [place] [address]  Please call to advise

## 2023-12-21 NOTE — Telephone Encounter (Signed)
 This RN made an outbound call to patient. Patient stated she is completely out of gabapentin  because she made a mistake and thought she had an extra bottle at home. MAR reflects 1 refill remaining. This RN inquired if patient had called the pharmacy. Patient stated she called [facility] and they are supposed to be filling this medication today.  FYI- patient also wanted this RN to let her PCP know that she had an appointment with neurology yesterday and was started on Aricept.   Copied from CRM [id number]. Topic: Clinical - Prescription Issue >> Dec 21, 2023  8:09 AM Jenna Meyers wrote: Reason for CRM: Patient is calling in again about her gabapentin  (NEURONTIN ) 300 MG capsule. She very concerned about going into the weekend without it. I assured her the her med refill request has been sent to Nurse Triage and to her provider. I did make her aware that it could take up to 3 business days and she stated she made an error and thought she had refills left and she did not. There is a sense of urgency for the patient regarding this matter. Reason for Disposition  Health Information question, no triage required and triager able to answer question  Protocols used: Information Only Call - No Triage-A-AH

## 2023-12-21 NOTE — Telephone Encounter (Signed)
 Aricept 5mg  added to med list

## 2023-12-21 NOTE — Telephone Encounter (Signed)
 Pt states she never received her march mail order gabapentin  through the mail?  Can you check the database?

## 2023-12-21 NOTE — Telephone Encounter (Signed)
 Chief Complaint: back pain Symptoms: back pain, hip pain Frequency: chronic, worsening today Pertinent Negatives: Patient denies urinary symptoms, CP, Sob, numbness Disposition: [x] ED /[] Urgent Care (no appt availability in office) / [] Appointment(In office/virtual)/ []  [facility]/ [] Home Care/ [] Refused Recommended Disposition /[] [facility]/ []  Follow-up with PCP Additional Notes: pt reports severe lower back and coccyx pain. Pt states she recently had a steroid injection in her lower back and "something doesn't feel right." Pt denies numbness or tingling. Pt is clearly in a great deal of pain while on the phone. Pt states walking is difficulty and she is unable to sit in a chair. Pt states she has no more gabapentin . Chart review shows it was refilled in March w/ 1 refill left. Pt states she hasn't been able to find the medication anywhere. RN advised pt go to the ED. Pt has no transportation. RN called 911. RN advised pt [facility] is on the way. Pt verbalized understanding.     Copied from CRM [id number]. Topic: Clinical - Red Word Triage >> Dec 21, 2023  4:25 PM Ethelle Herb L wrote: Red Word that prompted transfer to Nurse Triage: back pain, does not have rx to help w/ pain Reason for Disposition  Patient sounds very sick or weak to the triager  Answer Assessment - Initial Assessment Questions 1. ONSET: "When did the pain begin?"      Chronic back pain, recent worsening for a few days 2. LOCATION: "Where does it hurt?" (upper, mid or lower back)     Near coccyx and lower back pain 3. SEVERITY: "How bad is the pain?"  (e.g., Scale 1-10; mild, moderate, or severe)   - MILD (1-3): Doesn't interfere with normal activities.    - MODERATE (4-7): Interferes with normal activities or awakens from sleep.    - SEVERE (8-10): Excruciating pain, unable to do any normal activities.      10/10 4. PATTERN: "Is the pain constant?" (e.g., yes, no; constant, intermittent)      Constant  5.  RADIATION: "Does the pain shoot into your legs or somewhere else?"     Pain in hips, not radiating to her legs 6. CAUSE:  "What do you think is causing the back pain?"      Hx of chronic back pain  7. BACK OVERUSE:  "Any recent lifting of heavy objects, strenuous work or exercise?"     No  8. MEDICINES: "What have you taken so far for the pain?" (e.g., nothing, acetaminophen , NSAIDS)     States ibuprofen did not work 9. NEUROLOGIC SYMPTOMS: "Do you have any weakness, numbness, or problems with bowel/bladder control?"     Denies numbness or tingling, denies problem with bowel or bladder incontinence  10. OTHER SYMPTOMS: "Do you have any other symptoms?" (e.g., fever, abdomen pain, burning with urination, blood in urine)       Got a steroid injection in her lower back 4/25. Pt states the injection helped initially, now it is worse. Pt can't sit in a chair, it hurts to walk. Pain in lower back and hips. Denies urinary symptoms. No fever.  Protocols used: Back Pain-A-AH

## 2023-12-21 NOTE — Telephone Encounter (Signed)
 Copied from CRM [id number]. Topic: Clinical - Medication Refill >> Dec 21, 2023 12:02 PM Baldomero Bone wrote: Most Recent Primary Care Visit:  Provider: Adeline Hone  Department: [facility]  Visit Type: ACUTE  Date: 11/07/2023  Medication: gabapentin  (NEURONTIN ) 300 MG capsule  Has the patient contacted their pharmacy? Yes (Agent: If no, request that the patient contact the pharmacy for the refill. If patient does not wish to contact the pharmacy document the reason why and proceed with request.) (Agent: If yes, when and what did the pharmacy advise?)Refill sent to wrong pharmacy  Is this the correct pharmacy for this prescription? Yes If no, delete pharmacy and type the correct one.  This is the patient's preferred pharmacy:  [facility] [id number] [place], [place] - [address] [place] [address] Phone: [phone] Fax: [phone]  Has the prescription been filled recently? Yes  Is the patient out of the medication? Yes  Has the patient been seen for an appointment in the last year OR does the patient have an upcoming appointment? Yes  Can we respond through MyChart? Yes  Agent: Please be advised that Rx refills may take up to 3 business days. We ask that you follow-up with your pharmacy.

## 2023-12-21 NOTE — Telephone Encounter (Signed)
 Pt.notified

## 2024-01-01 ENCOUNTER — Ambulatory Visit
Admission: RE | Admit: 2024-01-01 | Discharge: 2024-01-01 | Disposition: A | Discharge status: Home / Self Care | Source: Ambulatory Visit | Attending: Neurology | Admitting: Neurology

## 2024-01-01 DIAGNOSIS — R4189 Other symptoms and signs involving cognitive functions and awareness: Secondary | ICD-10-CM | POA: Insufficient documentation

## 2024-01-15 ENCOUNTER — Ambulatory Visit: Payer: Self-pay

## 2024-01-15 NOTE — Telephone Encounter (Signed)
 Chief Complaint: loose BM's Symptoms: loose bowel movements, mild intermittent abdominal discomfort Frequency: x couple months (worsening) Pertinent Negatives: Patient denies nausea, vomiting, blood in stool Disposition: [] ED /[] Urgent Care (no appt availability in office) / [x] Appointment(In office/virtual)/ []  [facility]/ [] Home Care/ [] Refused Recommended Disposition /[] [facility]/ []  Follow-up with PCP Additional Notes: No available appts with PCP, patient requesting to be seen by Dr Bud Care, scheduled for tomorrow morning. Patient states she lives at a retirement facility and has to check the transportation schedule for tomorrow and will call back if she needs to reschedule.  Copied from CRM [id number]. Topic: Clinical - Medical Advice >> Jan 15, 2024 10:32 AM Chrystal Crape R wrote: Pt called to sch appointment for concerns with her bowel, Her stool has become to be loose, she believes its because she's been under a lot of stress. [facility] denied scheduling as it was a worsening problem Reason for Disposition  [1] Mild diarrhea (e.g., 1-3 or more stools than normal in past 24 hours) without known cause AND [2] present >  7 days  Answer Assessment - Initial Assessment Questions 1. DIARRHEA SEVERITY: "How bad is the diarrhea?" "How many more stools have you had in the past 24 hours than normal?"    - NO DIARRHEA (SCALE 0)   - MILD (SCALE 1-3): Few loose or mushy BMs; increase of 1-3 stools over normal daily number of stools; mild increase in ostomy output.   -  MODERATE (SCALE 4-7): Increase of 4-6 stools daily over normal; moderate increase in ostomy output.   -  SEVERE (SCALE 8-10; OR "WORST POSSIBLE"): Increase of 7 or more stools daily over normal; moderate increase in ostomy output; incontinence.     6-8 over the last month. Mild.  2. ONSET: "When did the diarrhea begin?"      Couple months,   3. BM CONSISTENCY: "How loose or watery is the diarrhea?"       Loose. Not watery and not normal formed stools.  4. VOMITING: "Are you also vomiting?" If Yes, ask: "How many times in the past 24 hours?"      No.  5. ABDOMEN PAIN: "Are you having any abdomen pain?" If Yes, ask: "What does it feel like?" (e.g., crampy, dull, intermittent, constant)      Discomfort, irritable. Intermittent.  6. ABDOMEN PAIN SEVERITY: If present, ask: "How bad is the pain?"  (e.g., Scale 1-10; mild, moderate, or severe)   - MILD (1-3): doesn't interfere with normal activities, abdomen soft and not tender to touch    - MODERATE (4-7): interferes with normal activities or awakens from sleep, abdomen tender to touch    - SEVERE (8-10): excruciating pain, doubled over, unable to do any normal activities       More frequent, mild to moderate.  7. ORAL INTAKE: If vomiting, "Have you been able to drink liquids?" "How much liquids have you had in the past 24 hours?"     Yes she states she has been drinking water and coffee. She states she fills her thermos throughout the day and it is 36 oz.  8. HYDRATION: "Any signs of dehydration?" (e.g., dry mouth [not just dry lips], too weak to stand, dizziness, new weight loss) "When did you last urinate?"     No signs of dehydration. She states no problems urinating.  9. EXPOSURE: "Have you traveled to a foreign country recently?" "Have you been exposed to anyone with diarrhea?" "Could you have eaten any food that  was spoiled?"     No.  10. ANTIBIOTIC USE: "Are you taking antibiotics now or have you taken antibiotics in the past 2 months?"       No.  11. OTHER SYMPTOMS: "Do you have any other symptoms?" (e.g., fever, blood in stool)       No.  12. PREGNANCY: "Is there any chance you are pregnant?" "When was your last menstrual period?"       N/A.  Protocols used: [facility]

## 2024-01-16 ENCOUNTER — Ambulatory Visit: Admitting: Internal Medicine

## 2024-01-16 ENCOUNTER — Other Ambulatory Visit: Payer: Self-pay

## 2024-01-16 ENCOUNTER — Encounter: Payer: Self-pay | Admitting: Internal Medicine

## 2024-01-16 VITALS — BP 124/76 | HR 97 | Temp 98.1°F | Resp 18 | Ht 64.0 in | Wt 186.2 lb

## 2024-01-16 DIAGNOSIS — K591 Functional diarrhea: Secondary | ICD-10-CM | POA: Diagnosis not present

## 2024-01-16 NOTE — Progress Notes (Signed)
 Acute Office Visit  Subjective:     Patient ID: Jenna Meyers, female    DOB: June 06, 1956, 68 y.o.   MRN: [id number]  Chief Complaint  Patient presents with   Diarrhea    Loose stools  for 2 months, soft, no formation    Diarrhea  Pertinent negatives include no abdominal pain, chills or fever.   Patient is in today for loose stools.   Discussed the use of AI scribe software for clinical note transcription with the patient, who gave verbal consent to proceed.  History of Present Illness Jenna Meyers is a 68 year old female who presents with changes in bowel habits and stool consistency.  For the past two months, she experiences changes in bowel habits with stools classified as types five and six on the [facility] stool chart, indicating soft, piece-like stools. Stools occasionally float. There is no blood in the stools and no significant abdominal pain, though she feels a sense of irritability in her bowels.  She resides in a retirement community with access to chef-prepared meals, typically consuming salads and maintaining a healthy diet. There have been no significant dietary changes and she is not using fiber supplements.  She is under stress due to her son's psychiatric issues, which have been a significant source of stress. She has taken measures to distance herself from him for her well-being.  She is on Aricept for early onset Alzheimer's, with no recent changes in her medication regimen.      Review of Systems  Constitutional:  Negative for chills and fever.  Gastrointestinal:  Positive for diarrhea. Negative for abdominal pain, blood in stool, melena and nausea.        Objective:    BP 124/76 (Cuff Size: Large)   Pulse 97   Temp 98.1 F (36.7 C) (Oral)   Resp 18   Ht 5\' 4"  (1.626 m)   Wt 186 lb 3.2 oz (84.5 kg)   SpO2 98%   BMI 31.96 kg/m    Physical Exam Constitutional:      Appearance: Normal appearance.  HENT:     Head: Normocephalic and  atraumatic.  Eyes:     Conjunctiva/sclera: Conjunctivae normal.  Cardiovascular:     Rate and Rhythm: Normal rate and regular rhythm.  Pulmonary:     Effort: Pulmonary effort is normal.     Breath sounds: Normal breath sounds.  Skin:    General: Skin is warm and dry.  Neurological:     General: No focal deficit present.     Mental Status: She is alert. Mental status is at baseline.  Psychiatric:        Mood and Affect: Mood normal.        Behavior: Behavior normal.     No results found for any visits on 01/16/24.      Assessment & Plan:   Assessment & Plan Loose stools Loose stools for two months, types 5 and 6 on the [facility] stool chart. No blood, significant abdominal pain, or evidence of infectious diarrhea or malabsorption syndrome. Likely due to low dietary fiber and stress. - Increase dietary fiber intake to 25 grams per day. - Start fiber supplements such as Benefiber or Metamucil, beginning with half the recommended dose to avoid bloating and gas. - Provide a list of high-fiber foods. - Discuss potential benefits of probiotics for gut microbiome health.  Stress-related gastrointestinal symptoms Increased stress levels due to personal family issues, potentially contributing to gastrointestinal symptoms. Stress management is crucial to  alleviate symptoms. - Discuss the impact of stress on gastrointestinal health and the importance of managing stress.  Return if symptoms worsen or fail to improve.  Rockney Cid, DO

## 2024-01-16 NOTE — Patient Instructions (Signed)
 Recommend starting over the counter fiber supplement - start low dose and increase slowly increase. Good brands are Benefiber or Metamucil.  Also recommend probiotic - can either get in combination with fiber or can get separate - Align is a good brand to try.   Fiber Content in Foods Fiber is found in plant foods, such as fruits, vegetables, whole grains, nuts, seeds, and beans. If you have certain conditions, you may need to eat a high-fiber diet or a low-fiber diet. Your health care provider will tell you how much fiber you need. If you have problems or questions, contact your provider or dietitian. What foods are high in fiber?  Foods high in fiber have 4g of fiber or more per serving. Fruits Blackberries or raspberries (fresh) --  cup (75 g) has 4 g of fiber. Pear (fresh) -- 1 medium (180 g) has 5.5 g of fiber. Prunes (dried) -- 6 to 8 pieces (57-76 g) has 5 g of fiber. Apple with skin -- 1 medium (182 g) has 4.8 g of fiber. Guava -- 1 cup (128 g) has 8.9 g of fiber. Vegetables Peas (frozen) --  cup (80 g) has 4.4 g of fiber. Potato with skin (baked) -- 1 medium (173 g) has 4 g of fiber. Pumpkin (canned) --  cup (122 g) has 4 g of fiber. Sweet potato --  cup mashed (124 g) has 4 g of fiber. Winter squash -- 1 cup cooked (205 g) has 5.7 g of fiber. Grains Bran cereal --  cup (31 g) has 8.6 g of fiber. Bulgur (cooked) --  cup (70 g) has 4 g of fiber. Quinoa (cooked) -- 1 cup (185 g) has 5.2 g of fiber. Popcorn -- 3 cups (375 g) popped has 5.8 g of fiber. Spaghetti, whole wheat -- 1 cup (140 g) has 6 g of fiber. Oatmeal (cooked) -- 1 cup (234 g) has 4g of fiber. Meats and other proteins Pinto beans (cooked) --  cup (90 g) has 7.7 g of fiber. Lentils (cooked) --  cup (90 g) has 7.8 g of fiber. Kidney beans (canned) --  cup (92.5 g) has 5.7 g of fiber. Soybeans (canned, frozen, or fresh) --  cup (92.5 g) has 5.2 g of fiber. Baked beans, plain or vegetarian (canned) --  cup  (130 g) has 5.2 g of fiber. Garbanzo beans or chickpeas (canned) --  cup (90 g) has 6.6 g of fiber. Black beans (cooked) --  cup (86 g) has 7.5 g of fiber. White beans or navy beans (cooked) --  cup (91 g) has 9.3 g of fiber. The items listed above may not be a complete list of foods with high fiber. Actual amounts of fiber may be different depending on processing. Contact a dietitian for more information. What foods are moderate in fiber?  Moderate fiber foods have 1-3 g of fiber per serving. Fruits Banana -- 1 medium (126 g) has 3.2 g of fiber. Melon -- 1 cup (155 g) has 1.4 g of fiber. Orange -- 1 small (154 g) has 3.7 g of fiber. Raisins --  cup (40 g) has 1.8 g of fiber. Applesauce, sweetened --  cup (125 g) has 1.5 g of fiber. Blueberries (fresh) --  cup (75 g) has 1.8 g of fiber. Strawberries (fresh, sliced) -- 1 cup (150 g) has 3 g of fiber. Cherries -- 1 cup (140 g) has 2.9 g of fiber. Vegetables Broccoli (cooked) --  cup (77.5 g) has 2.1 g of  fiber. Brussels sprouts (cooked) --  cup (78 g) has 3 g of fiber. Carrots (cooked) --  cup (77.5 g) has 2.2 g of fiber. Corn (canned or frozen) --  cup (82.5 g) has 2.1 g of fiber. Potatoes, mashed --  cup (105 g) has 1.6 g of fiber. Tomato -- 1 medium (62 g) has 1.5 g of fiber. Green beans (canned) --  cup (83 g) has 2 g of fiber. Sweet potato, baked -- 1 medium (150 g) has 3 g of fiber. Cauliflower (cooked) -- 1/2 cup (90 g) has 2.3 g of fiber. Grains Long-grain brown rice (cooked) -- 1 cup (196 g) has 3.5 g of fiber. Bagel, plain -- one 4-inch (10 cm) bagel has 2 g of fiber. Instant oatmeal --  cup (120 g) has about 2 g of fiber. Macaroni noodles, enriched (cooked) -- 1 cup (140 g) has 2.5 g of fiber. Multigrain cereal --  cup (15 g) has about 2-4 g of fiber. Whole-wheat bread -- 1 slice (26 g) has 2 g of fiber. Whole-wheat spaghetti noodles --  cup (70 g) has 3.2 g of fiber. Corn tortilla -- one 6-inch (15 cm)  tortilla has 1.5 g of fiber. Meats and other proteins Almonds --  cup or 1 oz (28 g) has 3.5 g of fiber. Sunflower seeds in shell --  cup or  oz (11.5 g) has 1.1 g of fiber. Vegetable or soy patty -- 1 patty (70 g) has 3.4 g of fiber. Walnuts --  cup or 1 oz (30 g) has 2 g of fiber. Flax seed -- 1 Tbsp (7 g) has 2.8 g of fiber. The items listed above may not be a complete list of foods with moderate amounts of fiber. Actual amounts of fiber may be different depending on processing. Contact a dietitian for more information. What foods are low in fiber?  Low-fiber foods contain less than 1 g of fiber per serving. They include: Fruits Fruit juice --  cup or 4 fl oz (118 mL) has 0.5 g of fiber. Vegetables Lettuce -- 1 cup (35 g) has 0.5 g of fiber. Cucumber (slices) --  cup (60 g) has 0.3 g of fiber. Celery -- 1 stalk (40 g) has 0.1 g of fiber. Grains Flour tortilla -- one 6-inch (15 cm) tortilla has 0.5 g of fiber. White rice (cooked) --  cup (81.5 g) has 0.3 g of fiber. Meats and other proteins Egg -- 1 large (50 g) has 0 g of fiber. Meat, poultry, or fish -- 3 oz (85 g) has 0 g of fiber. Dairy Milk -- 1 cup or 8 fl oz (237 mL) has 0 g of fiber. Yogurt -- 1 cup (245 g) has 0 g of fiber. The items listed above may not be a complete list of foods that are low in fiber. Actual amounts of fiber may be different depending on processing. Contact a dietitian for more information. This information is not intended to replace advice given to you by your health care provider. Make sure you discuss any questions you have with your health care provider. Document Revised: 10/30/2022 Document Reviewed: 10/30/2022 Elsevier Patient Education  2024 [employer].

## 2024-01-30 ENCOUNTER — Encounter: Payer: Self-pay | Admitting: Family Medicine

## 2024-02-06 ENCOUNTER — Other Ambulatory Visit: Payer: Self-pay

## 2024-02-06 ENCOUNTER — Emergency Department
Admission: EM | Admit: 2024-02-06 | Discharge: 2024-02-06 | Disposition: A | Discharge status: Home / Self Care | Attending: Emergency Medicine | Admitting: Emergency Medicine

## 2024-02-06 ENCOUNTER — Emergency Department

## 2024-02-06 DIAGNOSIS — M62838 Other muscle spasm: Secondary | ICD-10-CM | POA: Diagnosis present

## 2024-02-06 DIAGNOSIS — I6782 Cerebral ischemia: Secondary | ICD-10-CM | POA: Insufficient documentation

## 2024-02-06 DIAGNOSIS — R9082 White matter disease, unspecified: Secondary | ICD-10-CM | POA: Insufficient documentation

## 2024-02-06 LAB — COMPREHENSIVE METABOLIC PANEL WITH GFR
ALT: 15 U/L (ref 0–44)
AST: 17 U/L (ref 15–41)
Albumin: 3.9 g/dL (ref 3.5–5.0)
Alkaline Phosphatase: 54 U/L (ref 38–126)
Anion gap: 10 (ref 5–15)
BUN: 25 mg/dL — ABNORMAL HIGH (ref 8–23)
CO2: 20 mmol/L — ABNORMAL LOW (ref 22–32)
Calcium: 9 mg/dL (ref 8.9–10.3)
Chloride: 110 mmol/L (ref 98–111)
Creatinine, Ser: 0.86 mg/dL (ref 0.44–1.00)
GFR, Estimated: >60 mL/min (ref >60)
Glucose, Bld: 90 mg/dL (ref 70–99)
Potassium: 4.3 mmol/L (ref 3.5–5.1)
Sodium: 140 mmol/L (ref 135–145)
Total Bilirubin: 0.7 mg/dL (ref 0.0–1.2)
Total Protein: 6.8 g/dL (ref 6.5–8.1)

## 2024-02-06 LAB — CBC WITH DIFFERENTIAL/PLATELET
Abs Immature Granulocytes: 0.02 10*3/uL (ref 0.00–0.07)
Basophils Absolute: 0.1 10*3/uL (ref 0.0–0.1)
Basophils Relative: 1 %
Eosinophils Absolute: 0.1 10*3/uL (ref 0.0–0.5)
Eosinophils Relative: 1 %
HCT: 36.8 % (ref 36.0–46.0)
Hemoglobin: 12.3 g/dL (ref 12.0–15.0)
Immature Granulocytes: 0 %
Lymphocytes Relative: 29 %
Lymphs Abs: 2.3 10*3/uL (ref 0.7–4.0)
MCH: 30.5 pg (ref 26.0–34.0)
MCHC: 33.4 g/dL (ref 30.0–36.0)
MCV: 91.3 fL (ref 80.0–100.0)
Monocytes Absolute: 0.4 10*3/uL (ref 0.1–1.0)
Monocytes Relative: 6 %
Neutro Abs: 4.9 10*3/uL (ref 1.7–7.7)
Neutrophils Relative %: 63 %
Platelets: 319 10*3/uL (ref 150–400)
RBC: 4.03 MIL/uL (ref 3.87–5.11)
RDW: 12.8 % (ref 11.5–15.5)
WBC: 7.8 10*3/uL (ref 4.0–10.5)
nRBC: 0 % (ref 0.0–0.2)

## 2024-02-06 NOTE — ED Notes (Signed)
 Informed patient about [facility].

## 2024-02-06 NOTE — ED Triage Notes (Signed)
 Patient states 3 days ago she started having neck spasms and then eyes crossed; episode lasted about 1-2 minutes. Discussed with Dr. Mason Sole and was told to come to ED for workup.

## 2024-02-06 NOTE — Discharge Instructions (Signed)
 Your laboratory workup and CT imaging of your head was all reassuring and normal here today.  Please follow-up with your neurology team outpatient for reassessment.

## 2024-02-06 NOTE — ED Provider Notes (Signed)
 [facility] Provider Note    Event Date/Time   First MD Initiated Contact with Patient 02/06/24 1334     (approximate)   History   Eye Problem   HPI Jenna Meyers is a 68 y.o. female with history of anxiety, MDD presenting today for episode of neck spasms.  Patient said 2 days ago she had an episode with spasming in her neck and then had episode of crossing of her eyes.  Episode was very brief and she has not had a recurrence of it.  No prior episodes of the same.  She denied any symptoms such as vision loss, difficulty breathing, speech changes, numbness, weakness anywhere.  No difficulty with ambulating and has felt completely fine since the incident.  Denies any other acute pathology.  Called her neurology office and told to come to the ED for further evaluation.     Physical Exam   Triage Vital Signs: ED Triage Vitals  Encounter Vitals Group     BP 02/06/24 1227 135/69     Girls Systolic BP Percentile --      Girls Diastolic BP Percentile --      Boys Systolic BP Percentile --      Boys Diastolic BP Percentile --      Pulse Rate 02/06/24 1227 69     Resp 02/06/24 1227 20     Temp 02/06/24 1227 97.8 F (36.6 C)     Temp Source 02/06/24 1227 Oral     SpO2 02/06/24 1227 98 %     Weight --      Height --      Head Circumference --      Peak Flow --      Pain Score 02/06/24 1228 0     Pain Loc --      Pain Education --      Exclude from Growth Chart --     Most recent vital signs: Vitals:   02/06/24 1227  BP: 135/69  Pulse: 69  Resp: 20  Temp: 97.8 F (36.6 C)  SpO2: 98%   Physical Exam: I have reviewed the vital signs and nursing notes. General: Awake, alert, no acute distress.  Nontoxic appearing. Head:  Atraumatic, normocephalic.   ENT:  EOM intact, PERRL. Oral mucosa is pink and moist with no lesions. Neck: Neck is supple with full range of motion, No meningeal signs. Cardiovascular:  RRR, No murmurs. Peripheral pulses  palpable and equal bilaterally. Respiratory:  Symmetrical chest wall expansion.  No rhonchi, rales, or wheezes.  Good air movement throughout.  No use of accessory muscles.   Musculoskeletal:  No cyanosis or edema. Moving extremities with full ROM Abdomen:  Soft, nontender, nondistended. Neuro:  GCS 15, moving all four extremities, interacting appropriately. Speech clear.  Cranial nerves II through XII intact.  No acute vision changes.  Sensation equal and intact throughout bilateral upper and lower extremities.  5 out of 5 strength throughout bilateral upper and lower extremities. Psych:  Calm, appropriate.   Skin:  Warm, dry, no rash.    ED Results / Procedures / Treatments   Labs (all labs ordered are listed, but only abnormal results are displayed) Labs Reviewed  COMPREHENSIVE METABOLIC PANEL WITH GFR - Abnormal; Notable for the following components:      Result Value   CO2 20 (*)    BUN 25 (*)    All other components within normal limits  CBC WITH DIFFERENTIAL/PLATELET     EKG  RADIOLOGY CT pending at time of signout   PROCEDURES:  Critical Care performed: No  Procedures   MEDICATIONS ORDERED IN ED: Medications - No data to display   IMPRESSION / MDM / ASSESSMENT AND PLAN / ED COURSE  I reviewed the triage vital signs and the nursing notes.                              Differential diagnosis includes, but is not limited to, muscle spasms, acute intracranial process such as mass, electrolyte abnormality  Patient's presentation is most consistent with acute complicated illness / injury requiring diagnostic workup.  Patient is a 68 year old female presenting today for a one-time brief episode of neck spasm as well as eyes crossing.  Episode was very brief and has not occurred since.  She had no other acute neurological symptoms such as speech changes, vision changes, numbness, weakness, or any radiating pain.  No neck trauma.  Physical exam specifically  neurological exam is completely unremarkable at this time with stable vital signs.  Laboratory workup reassuring.  We discussed getting a CT head to rule out any acute intracranial pathology which she agrees with.  If negative, can likely follow-up outpatient with her neurology team for reassessment.  Signed out to oncoming provider pending results of CT.     FINAL CLINICAL IMPRESSION(S) / ED DIAGNOSES   Final diagnoses:  Neck muscle spasm     Rx / DC Orders   ED Discharge Orders     None        Note:  This document was prepared using Dragon voice recognition software and may include unintentional dictation errors.   Kandee Orion, MD 02/06/24 1500

## 2024-02-06 NOTE — ED Provider Notes (Signed)
 CT head IMPRESSION:  1.  No evidence of an acute intracranial abnormality.  2. Mild cerebral white matter disease, nonspecific but most often  secondary to chronic small vessel ischemia.   Patient continues to feel well. Will plan on discharging at this time. Encouraged patient to return for any new or concerning symptoms.    Marylynn Soho, MD 02/06/24 1728

## 2024-02-06 NOTE — ED Notes (Signed)
 RN offered ride if needed. Patient stated that she had a ride here to pick her up.

## 2024-02-06 NOTE — ED Notes (Signed)
 [facility] called and stated that they would not be able to pick up patient from ED because they would be done for the night.

## 2024-02-08 ENCOUNTER — Encounter: Admitting: Family Medicine

## 2024-02-12 ENCOUNTER — Encounter: Admitting: Family Medicine

## 2024-02-20 ENCOUNTER — Ambulatory Visit (INDEPENDENT_AMBULATORY_CARE_PROVIDER_SITE_OTHER): Admitting: Nurse Practitioner

## 2024-02-20 ENCOUNTER — Encounter: Payer: Self-pay | Admitting: Nurse Practitioner

## 2024-02-20 ENCOUNTER — Ambulatory Visit: Payer: Self-pay

## 2024-02-20 VITALS — BP 122/80 | HR 91 | Resp 16 | Ht 64.0 in | Wt 186.2 lb

## 2024-02-20 DIAGNOSIS — R252 Cramp and spasm: Secondary | ICD-10-CM

## 2024-02-20 MED ORDER — ROPINIROLE HCL 0.25 MG PO TABS
0.2500 mg | ORAL_TABLET | Freq: Every day | ORAL | 0 refills | Status: DC
Start: 2024-02-20 — End: 2024-03-17

## 2024-02-20 NOTE — Progress Notes (Signed)
 BP 122/80   Pulse 91   Resp 16   Ht 5' 4 (1.626 m)   Wt 186 lb 3.2 oz (84.5 kg)   SpO2 99%   BMI 31.96 kg/m    Subjective:    Patient ID: Jenna Meyers, female    DOB: [birth year], 68 y.o.   MRN: [id number]  HPI: Jenna Meyers is a 68 y.o. female  Presenting here today for bilateral leg cramps and muscle spasms. Symptoms began 5 days ago ago but worsening last night. Pt has been using Motrin for pain relief. Symptoms are gradually worsening. Reports pain radiates to buttocks and lower back.   Muscle cramps: -worse at night and in the morning before moving around  -pain is relived with massage  -pain is not exacerbated by physical activity; able to complete ADLs -lives at a retirement community and receives PT and OT -reports walking 3 times a day around retirement community with no issues  -denies bruising or swelling of legs or ankles            02/20/2024   11:06 AM 01/16/2024   10:39 AM 06/26/2023   10:06 AM  Depression screen PHQ 2/9  Decreased Interest 0 0 0  Down, Depressed, Hopeless 0 0 0  PHQ - 2 Score 0 0 0  Altered sleeping 0  0  Tired, decreased energy 0  0  Change in appetite 0  0  Feeling bad or failure about yourself  0  0  Trouble concentrating 0  0  Moving slowly or fidgety/restless 0  0  Suicidal thoughts 0  0  PHQ-9 Score 0  0  Difficult doing work/chores Not difficult at all  Not difficult at all    Relevant past medical, surgical, family and social history reviewed and updated as indicated. Interim medical history since our last visit reviewed. Allergies and medications reviewed and updated.  Review of Systems  Constitutional: Negative for fever or weight change.  Respiratory: Negative for cough and shortness of breath.   Cardiovascular: Negative for chest pain or palpitations.  Gastrointestinal: Negative for abdominal pain, no bowel changes.  Musculoskeletal: Reports intermittent bilateral leg cramps/spasms, denies limited ROM Skin:  Negative for rash.  Neurological: Negative for dizziness or headache.  No other specific complaints in a complete review of systems (except as listed in HPI above).      Objective:     BP 122/80   Pulse 91   Resp 16   Ht 5' 4 (1.626 m)   Wt 186 lb 3.2 oz (84.5 kg)   SpO2 99%   BMI 31.96 kg/m    Wt Readings from Last 3 Encounters:  02/20/24 186 lb 3.2 oz (84.5 kg)  01/16/24 186 lb 3.2 oz (84.5 kg)  11/07/23 181 lb (82.1 kg)    Physical Exam Constitutional:      Appearance: Normal appearance.  Cardiovascular:     Rate and Rhythm: Normal rate and regular rhythm.     Pulses:          Dorsalis pedis pulses are 2+ on the right side and 2+ on the left side.       Posterior tibial pulses are 2+ on the right side and 2+ on the left side.  Pulmonary:     Effort: Pulmonary effort is normal.     Breath sounds: Normal breath sounds.  Musculoskeletal:     Cervical back: Normal range of motion.     Right lower leg: Normal. No swelling or tenderness.  No edema.     Left lower leg: Normal. No swelling or tenderness. No edema.     Right foot: Normal range of motion.     Left foot: Normal range of motion.  Feet:     Left foot:     Skin integrity: Skin integrity normal.  Skin:    General: Skin is warm and dry.  Neurological:     Mental Status: She is alert.      Results for orders placed or performed during the hospital encounter of 02/06/24  Comprehensive metabolic panel   Collection Time: 02/06/24 12:33 PM  Result Value Ref Range   Sodium 140 135 - 145 mmol/L   Potassium 4.3 3.5 - 5.1 mmol/L   Chloride 110 98 - 111 mmol/L   CO2 20 (L) 22 - 32 mmol/L   Glucose, Bld 90 70 - 99 mg/dL   BUN 25 (H) 8 - 23 mg/dL   Creatinine, Ser 9.13 0.44 - 1.00 mg/dL   Calcium  9.0 8.9 - 10.3 mg/dL   Total Protein 6.8 6.5 - 8.1 g/dL   Albumin 3.9 3.5 - 5.0 g/dL   AST 17 15 - 41 U/L   ALT 15 0 - 44 U/L   Alkaline Phosphatase 54 38 - 126 U/L   Total Bilirubin 0.7 0.0 - 1.2 mg/dL   GFR,  Estimated >39 >39 mL/min   Anion gap 10 5 - 15  CBC with Differential   Collection Time: 02/06/24 12:33 PM  Result Value Ref Range   WBC 7.8 4.0 - 10.5 K/uL   RBC 4.03 3.87 - 5.11 MIL/uL   Hemoglobin 12.3 12.0 - 15.0 g/dL   HCT 63.1 63.9 - 53.9 %   MCV 91.3 80.0 - 100.0 fL   MCH 30.5 26.0 - 34.0 pg   MCHC 33.4 30.0 - 36.0 g/dL   RDW 87.1 88.4 - 84.4 %   Platelets 319 150 - 400 K/uL   nRBC 0.0 0.0 - 0.2 %   Neutrophils Relative % 63 %   Neutro Abs 4.9 1.7 - 7.7 K/uL   Lymphocytes Relative 29 %   Lymphs Abs 2.3 0.7 - 4.0 K/uL   Monocytes Relative 6 %   Monocytes Absolute 0.4 0.1 - 1.0 K/uL   Eosinophils Relative 1 %   Eosinophils Absolute 0.1 0.0 - 0.5 K/uL   Basophils Relative 1 %   Basophils Absolute 0.1 0.0 - 0.1 K/uL   Immature Granulocytes 0 %   Abs Immature Granulocytes 0.02 0.00 - 0.07 K/uL          Assessment & Plan:   Problem List Items Addressed This Visit   None Visit Diagnoses       Leg cramps    -  Primary   Checking labs. Starting on Requip at night.   Relevant Medications   rOPINIRole (REQUIP) 0.25 MG tablet   Other Relevant Orders   Vitamin B12   Magnesium   Thyroid  Panel With TSH   VITAMIN D  25 Hydroxy (Vit-D Deficiency, Fractures)   CK (Creatine Kinase)   Comprehensive metabolic panel with GFR   Iron, TIBC and Ferritin Panel        Assessment and Plan  Discussed stretching, massage, heating pad for pain relief. Advised wedge pillow in between legs at night when sleeping. Checking labs today to rule out underlying cause of leg cramps. Starting on Requip 0.25mg  PO nightly.  Follow up plan: Return if symptoms worsen or fail to improve.  I have reviewed this encounter  including the documentation in this note and/or discussed this patient with the provider, Aislinn Womack, SNP, I am certifying that I agree with the content of this note as supervising/preceptor nurse practitioner.  Mliss Spray, FNP-C [facility] 02/20/2024, 12:20 PM

## 2024-02-20 NOTE — Telephone Encounter (Signed)
 FYI Only or Action Required?: FYI only for provider.  Patient was last seen in primary care on 01/16/2024 by Bernardo Fend, DO. Called Nurse Triage reporting Leg Pain and muscle spasms from hips down. Symptoms began several weeks ago but worsening last night. Interventions attempted: OTC medications: Motrin. Symptoms are: gradually worsening.  Triage Disposition: See HCP Within 4 Hours (Or PCP Triage)  Patient/caregiver understands and will follow disposition?: Yes        Copied from CRM [id number]. Topic: Clinical - Red Word Triage >> Feb 20, 2024  8:27 AM Elle L wrote: Red Word that prompted transfer to Nurse Triage: The patient has been experiencing severe leg cramps at night and she is currently experiencing pain from it and it is worsening. Reason for Disposition  [1] SEVERE pain (e.g., excruciating, unable to do any normal activities) AND [2] not improved after 2 hours of pain medicine  Answer Assessment - Initial Assessment Questions 1. ONSET: When did the pain start?      Has been going on for a few weeks, but getting worse last night  2. LOCATION: Where is the pain located?      Both legs from hip down,  3. PAIN: How bad is the pain?    (Scale 1-10; or mild, moderate, severe)   -  MILD (1-3): doesn't interfere with normal activities    -  MODERATE (4-7): interferes with normal activities (e.g., work or school) or awakens from sleep, limping    -  SEVERE (8-10): excruciating pain, unable to do any normal activities, unable to walk     10  4. WORK OR EXERCISE: Has there been any recent work or exercise that involved this part of the body?      Walks a lot at her retirement community, walks the entire community   5. CAUSE: What do you think is causing the leg pain?     Unsure of cause, this has been going on for several weeks  6. OTHER SYMPTOMS: Do you have any other symptoms? (e.g., chest pain, back pain, breathing difficulty, swelling, rash, fever, numbness,  weakness)     No  7. PREGNANCY: Is there any chance you are pregnant? When was your last menstrual period?     no  Protocols used: Leg Pain-A-AH

## 2024-02-21 ENCOUNTER — Ambulatory Visit: Payer: Self-pay | Admitting: Nurse Practitioner

## 2024-02-21 DIAGNOSIS — R748 Abnormal levels of other serum enzymes: Secondary | ICD-10-CM

## 2024-02-21 LAB — COMPREHENSIVE METABOLIC PANEL WITH GFR
AG Ratio: 1.8 (calc) (ref 1.0–2.5)
ALT: 18 U/L (ref 6–29)
AST: 25 U/L (ref 10–35)
Albumin: 4.2 g/dL (ref 3.6–5.1)
Alkaline phosphatase (APISO): 60 U/L (ref 37–153)
BUN: 16 mg/dL (ref 7–25)
CO2: 25 mmol/L (ref 20–32)
Calcium: 9.9 mg/dL (ref 8.6–10.4)
Chloride: 107 mmol/L (ref 98–110)
Creat: 0.9 mg/dL (ref 0.50–1.05)
Globulin: 2.3 g/dL (ref 1.9–3.7)
Glucose, Bld: 82 mg/dL (ref 65–99)
Potassium: 4.8 mmol/L (ref 3.5–5.3)
Sodium: 140 mmol/L (ref 135–146)
Total Bilirubin: 0.4 mg/dL (ref 0.2–1.2)
Total Protein: 6.5 g/dL (ref 6.1–8.1)
eGFR: 71 mL/min/{1.73_m2} (ref >=60)

## 2024-02-21 LAB — VITAMIN B12: Vitamin B-12: 271 pg/mL (ref 200–1100)

## 2024-02-21 LAB — IRON,TIBC AND FERRITIN PANEL
%SAT: 32 % (ref 16–45)
Ferritin: 58 ng/mL (ref 16–288)
Iron: 100 ng/mL (ref 45–288)
TIBC: 311 ug/dL (ref 250–450)

## 2024-02-21 LAB — MAGNESIUM: Magnesium: 2.4 mg/dL (ref 1.5–2.5)

## 2024-02-21 LAB — THYROID PANEL WITH TSH
Free Thyroxine Index: 2 (ref 1.4–3.8)
T4, Total: 7 ug/dL (ref 5.1–11.9)
TSH: 2.07 m[IU]/L (ref 0.40–4.50)
TSH: 29 m[IU]/L (ref 22–4.50)

## 2024-02-21 LAB — CK: Total CK: 396 U/L — ABNORMAL HIGH (ref 20–243)

## 2024-02-21 LAB — VITAMIN D 25 HYDROXY (VIT D DEFICIENCY, FRACTURES): Vit D, 25-Hydroxy: 34 ng/mL (ref 30–100)

## 2024-02-27 ENCOUNTER — Encounter: Payer: Self-pay | Admitting: Family Medicine

## 2024-02-27 ENCOUNTER — Ambulatory Visit (INDEPENDENT_AMBULATORY_CARE_PROVIDER_SITE_OTHER): Admitting: Family Medicine

## 2024-02-27 VITALS — BP 130/82 | HR 92 | Resp 16 | Ht 64.0 in | Wt 184.0 lb

## 2024-02-27 DIAGNOSIS — R748 Abnormal levels of other serum enzymes: Secondary | ICD-10-CM

## 2024-02-27 DIAGNOSIS — Z0001 Encounter for general adult medical examination with abnormal findings: Secondary | ICD-10-CM

## 2024-02-27 DIAGNOSIS — J439 Emphysema, unspecified: Secondary | ICD-10-CM | POA: Diagnosis not present

## 2024-02-27 DIAGNOSIS — F331 Major depressive disorder, recurrent, moderate: Secondary | ICD-10-CM

## 2024-02-27 DIAGNOSIS — Z818 Family history of other mental and behavioral disorders: Secondary | ICD-10-CM

## 2024-02-27 DIAGNOSIS — Z0000 Encounter for general adult medical examination without abnormal findings: Secondary | ICD-10-CM

## 2024-02-27 DIAGNOSIS — G8929 Other chronic pain: Secondary | ICD-10-CM

## 2024-02-27 DIAGNOSIS — M545 Low back pain, unspecified: Secondary | ICD-10-CM | POA: Diagnosis not present

## 2024-02-27 DIAGNOSIS — F419 Anxiety disorder, unspecified: Secondary | ICD-10-CM

## 2024-02-27 DIAGNOSIS — Z7189 Other specified counseling: Secondary | ICD-10-CM

## 2024-02-27 DIAGNOSIS — E782 Mixed hyperlipidemia: Secondary | ICD-10-CM

## 2024-02-27 NOTE — Progress Notes (Deleted)
 Subjective:   Jenna Meyers is a 68 y.o. female who presents for Medicare Annual (Subsequent) preventive examination.  Visit Complete: {[phone]}  Patient Medicare AWV questionnaire was completed by the patient on ***; I have confirmed that all information answered by patient is correct and no changes since this date.  Cardiac Risk Factors include: advanced age (>39men, >14 women);smoking/ tobacco exposure;obesity (BMI >30kg/m2)     Objective:    Today's Vitals   02/27/24 1015 02/27/24 1033  BP: 130/82   Pulse: 92   Resp: 16   SpO2: 97%   Weight: 184 lb (83.5 kg)   Height: 5' 4 (1.626 m)   PainSc:  5    Body mass index is 31.58 kg/m.     02/27/2024   10:33 AM 02/06/2024   12:29 PM 04/15/2022    5:28 PM 04/18/2021    6:56 PM 05/11/2020   10:26 AM 05/08/2019    1:47 PM 02/13/2019    4:53 PM  Advanced Directives  Does Patient Have a Medical Advance Directive? No No No No No No No  Would patient like information on creating a medical advance directive? Yes (Inpatient - patient defers creating a medical advance directive at this time - Information given) No - Patient declined No - Patient declined No - Patient declined Yes (MAU/Ambulatory/Procedural Areas - Information given) Yes (MAU/Ambulatory/Procedural Areas - Information given) No - Patient declined      Data saved with a previous flowsheet row definition     Current Medications (verified) Outpatient Encounter Medications as of 02/27/2024  Medication Sig  . donepezil (ARICEPT) 5 MG tablet Take 1 tablet by mouth at bedtime.  . DULoxetine  (CYMBALTA ) 30 MG capsule Take 1 capsule (30 mg total) by mouth 2 (two) times daily.  . fluticasone  (FLONASE ) 50 MCG/ACT nasal spray Place 2 sprays into both nostrils daily.  . gabapentin  (NEURONTIN ) 300 MG capsule Take 1 capsule (300 mg total) by mouth 3 (three) times daily.  . meloxicam  (MOBIC ) 7.5 MG tablet Take 1 tablet (7.5 mg total) by mouth daily.  . rOPINIRole   (REQUIP ) 0.25 MG tablet Take 1 tablet (0.25 mg total) by mouth at bedtime.  . loratadine  (CLARITIN ) 10 MG tablet Take 1 tablet (10 mg total) by mouth daily. (Patient not taking: Reported on 02/27/2024)  . Vitamin D , Ergocalciferol , (DRISDOL ) 1.25 MG (50000 UNIT) CAPS capsule Take 1 capsule (50,000 Units total) by mouth every 7 (seven) days. (Patient not taking: Reported on 02/27/2024)   No facility-administered encounter medications on file as of 02/27/2024.    Allergies (verified) Patient has no known allergies.   History: Past Medical History:  Diagnosis Date  . Anxiety 10/09/2012  . Aortic insufficiency 03/19/2018   ECHO July 2019  . Arthritis    back  . Depression   . Emphysema lung (HCC)   . Emphysema of lung (HCC)   . Fibromyalgia   . Grade I diastolic dysfunction 03/19/2018   ECHO July 2019  . Hyperlipidemia   . Hypertension   . IBS (irritable bowel syndrome)   . Loss of consciousness (HCC) 03/09/2019  . Lumbago   . Lumbar canal stenosis 09/14/2017   Moderate to severe, MRI Nov 2018  . LVH (left ventricular hypertrophy) 03/19/2018   ECHO July 2019  . Mitral regurgitation 03/19/2018   ECHO July 2019  . Tobacco abuse   . Vitamin D  deficiency disease    Past Surgical History:  Procedure Laterality Date  . ABDOMINAL HYSTERECTOMY  2000   partial, still  has cervix  . CESAREAN SECTION  1996  . LAPAROSCOPIC ABDOMINAL EXPLORATION  1999  . TONSILLECTOMY AND ADENOIDECTOMY  1962   Family History  Problem Relation Age of Onset  . Cancer Mother        colon  . Osteoporosis Mother   . Hyperlipidemia Mother   . Alzheimer's disease Father   . Thyroid  disease Son   . Heart disease Brother    Social History   Socioeconomic History  . Marital status: Divorced    Spouse name: Not on file  . Number of children: 1  . Years of education: Not on file  . Highest education level: Associate degree: academic program  Occupational History  . Occupation: Disabled  Tobacco Use  . Smoking  status: Former    Current packs/day: 0.00    Average packs/day: 1.5 packs/day for 34.0 years (51.0 ttl pk-yrs)    Types: Cigarettes    Start date: 89    Quit date: 2020    Years since quitting: 5.5  . Smokeless tobacco: Never  . Tobacco comments:    been given a Rx for Chantix  quit for 1 yr and started back  Vaping Use  . Vaping status: Never Used  Substance and Sexual Activity  . Alcohol use: Yes    Comment: rare  . Drug use: No  . Sexual activity: Not Currently    Partners: Male  Other Topics Concern  . Not on file  Social History Narrative   Pt lives with her son and daughter in law and 2 small grandchildren   Social Drivers of Health   Financial Resource Strain: Low Risk  (02/27/2024)   Overall Financial Resource Strain (CARDIA)   . Difficulty of Paying Living Expenses: Not hard at all  Food Insecurity: No Food Insecurity (02/27/2024)   Hunger Vital Sign   . Worried About Programme researcher, broadcasting/film/video in the Last Year: Never true   . Ran Out of Food in the Last Year: Never true  Transportation Needs: No Transportation Needs (02/27/2024)   PRAPARE - Transportation   . Lack of Transportation (Medical): No   . Lack of Transportation (Non-Medical): No  Physical Activity: Sufficiently Active (02/27/2024)   Exercise Vital Sign   . Days of Exercise per Week: 7 days   . Minutes of Exercise per Session: 60 min  Stress: No Stress Concern Present (02/27/2024)   [employer] of Occupational Health - Occupational Stress Questionnaire   . Feeling of Stress: Only a little  Social Connections: Moderately Isolated (02/27/2024)   Social Connection and Isolation Panel   . Frequency of Communication with Friends and Family: More than three times a week   . Frequency of Social Gatherings with Friends and Family: More than three times a week   . Attends Religious Services: Never   . Active Member of Clubs or Organizations: Yes   . Attends Banker Meetings: More than 4 times per year   .  Marital Status: Divorced    Tobacco Counseling Counseling given: Not Answered Tobacco comments: been given a Rx for Chantix  quit for 1 yr and started back   Clinical Intake:     Pain : 0-10 Pain Score: 5  Pain Location: Head     Nutritional Status: BMI > 30  Obese  How often do you need to have someone help you when you read instructions, pamphlets, or other written materials from your doctor or pharmacy?: 1 - Never  Interpreter Needed?: No  Activities of Daily Living    02/27/2024   10:15 AM 06/26/2023   10:06 AM  In your present state of health, do you have any difficulty performing the following activities:  Hearing? 0 0  Vision? 0 0  Difficulty concentrating or making decisions? 0 0  Walking or climbing stairs? 0 0  Dressing or bathing? 0 0  Doing errands, shopping? 0 0  Preparing Food and eating ? N   Using the Toilet? N   In the past six months, have you accidently leaked urine? N   Do you have problems with loss of bowel control? N   Managing your Medications? N   Managing your Finances? N   Housekeeping or managing your Housekeeping? N     Patient Care Team: Leavy Mole, PA-C as PCP - General (Family Medicine) Fernand Denyse LABOR, MD as Consulting Physician (Cardiology) Ermalinda Lenn HERO, [place] as Social Worker Lorelle Hussar, MD as Consulting Physician (Orthopedic Surgery)  Indicate any recent Medical Services you may have received from other than [facility] providers in the past year (date may be approximate).     Assessment:   This is a routine wellness examination for [facility].  Hearing/Vision screen No results found.   Goals Addressed   None    Depression Screen    02/27/2024   10:15 AM 02/20/2024   11:06 AM 01/16/2024   10:39 AM 06/26/2023   10:06 AM 03/26/2023    9:25 AM 02/27/2023   11:42 AM 08/15/2022   10:10 AM  PHQ 2/9 Scores  PHQ - 2 Score 0 0 0 0 4 2 3   PHQ- 9 Score 3 0  0 14 4 3     Fall Risk    02/27/2024   10:15 AM 02/20/2024    11:06 AM 01/16/2024   10:39 AM 08/13/2023   10:36 AM 06/26/2023   10:06 AM  Fall Risk   Falls in the past year? 0 0 0  0  Number falls in past yr: 0 0 0 0 0  Injury with Fall? 0 0 0 0 0  Risk for fall due to : No Fall Risks No Fall Risks No Fall Risks No Fall Risks   Follow up Falls prevention discussed Falls evaluation completed Falls evaluation completed Falls evaluation completed     MEDICARE RISK AT HOME:    TIMED UP AND GO:  Was the test performed?  {[phone]}    Cognitive Function:        02/27/2024   10:34 AM 05/08/2019    1:51 PM 04/23/2018    1:56 PM  6CIT Screen  What Year? 0 points 0 points 0 points  What month? 0 points 0 points 0 points  What time? 0 points 0 points 0 points  Count back from 20 0 points 0 points 0 points  Months in reverse 0 points 0 points 0 points  Repeat phrase 2 points 0 points 0 points  Total Score 2 points 0 points 0 points    Immunizations Immunization History  Administered Date(s) Administered  . Fluad Trivalent(High Dose 65+) 06/26/2023  . Influenza Inj Mdck Quad Pf 06/14/2017  . Influenza,inj,Quad PF,6+ Mos 04/04/2018, 05/12/2020, 06/21/2021  . Moderna Sars-Covid-2 Vaccination 02/25/2020, 03/24/2020, 11/02/2020  . PNEUMOCOCCAL CONJUGATE-20 04/04/2021  . Tdap 09/03/2015  . Zoster Recombinant(Shingrix ) 04/04/2018, 05/12/2020  . Zoster, Live 08/21/2013    {TDAP status:2101805}  {Flu Vaccine status:2101806}  {Pneumococcal vaccine status:2101807}  {Covid-19 vaccine status:2101808}  Qualifies for Shingles Vaccine? {YES/NO:21197}  Zostavax completed {YES/NO:21197}  {  Shingrix  Completed?:2101804}  Screening Tests Health Maintenance  Topic Date Due  . DEXA SCAN  Never done  . INFLUENZA VACCINE  03/21/2024  . MAMMOGRAM  09/03/2024  . Lung Cancer Screening  09/24/2024  . Medicare Annual Wellness (AWV)  02/26/2025  . DTaP/Tdap/Td (2 - Td or Tdap) 09/02/2025  . Fecal DNA (Cologuard)  07/14/2026  . Pneumococcal  Vaccine: 50+ Years  Completed  . Hepatitis C Screening  Completed  . Zoster Vaccines- Shingrix   Completed  . Hepatitis B Vaccines  Aged Out  . HPV VACCINES  Aged Out  . Meningococcal B Vaccine  Aged Out  . COVID-19 Vaccine  Discontinued    Health Maintenance  Health Maintenance Due  Topic Date Due  . DEXA SCAN  Never done    {Colorectal cancer screening:2101809}  {Mammogram status:21018020}  {Bone Density status:21018021}  Lung Cancer Screening: (Low Dose CT Chest recommended if Age 56-80 years, 20 pack-year currently smoking OR have quit w/in 15years.) {DOES NOT does:27190::does not} qualify.   Lung Cancer Screening Referral: ***  Additional Screening:  Hepatitis C Screening: {DOES NOT does:27190::does not} qualify; Completed ***  Vision Screening: Recommended annual ophthalmology exams for early detection of glaucoma and other disorders of the eye. Is the patient up to date with their annual eye exam?  {YES/NO:21197} Who is the provider or what is the name of the office in which the patient attends annual eye exams? *** If pt is not established with a provider, would they like to be referred to a provider to establish care? {YES/NO:21197}.   Dental Screening: Recommended annual dental exams for proper oral hygiene  Diabetic Foot Exam: {Diabetic Foot Exam:2101802}  Community Resource Referral / Chronic Care Management: CRR required this visit?  {YES/NO:21197}  CCM required this visit?  {CCM Required choices:[phone]}     Plan:     I have personally reviewed and noted the following in the patient's chart:   Medical and social history Use of alcohol, tobacco or illicit drugs  Current medications and supplements including opioid prescriptions. {Opioid Prescriptions:[phone]} Functional ability and status Nutritional status Physical activity Advanced directives List of other physicians Hospitalizations, surgeries, and ER visits in previous 12  months Vitals Screenings to include cognitive, depression, and falls Referrals and appointments  In addition, I have reviewed and discussed with patient certain preventive protocols, quality metrics, and best practice recommendations. A written personalized care plan for preventive services as well as general preventive health recommendations were provided to patient.     Clotilda Moles, CMA   02/27/2024   After Visit Summary: {CHL AMB AWV After Visit [phone]}  Nurse Notes: ***
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
## 2024-02-27 NOTE — Patient Instructions (Addendum)
  Jenna Meyers , Thank you for taking time to come for your Medicare Wellness Visit. I appreciate your ongoing commitment to your health goals. Please review the following plan we discussed and let me know if I can assist you in the future.   These are the goals we discussed:  Goals       DIET - INCREASE WATER INTAKE      Recommend to drink at least 6-8 8oz glasses of water per day.      Help with medicare enrollement      Care Coordination Interventions: Patient requesting assistance with her Medicare benefits, specifically a Flex Plan Collaboration phone call to the [facility] Information and Enrollment Center -patient would need to change to a medicare advantage plan Patient agreed to call the representative [phone] ext [phone] , however the representative called her before she could reach out to her who referred over to another representative to assist with a new plan Advantage Plan with the Flex Card  Patient will call independently to enroll in the new plan, patient's son to assist This social worker to follow up with patient to assess for any additional concerns       Quit Smoking      If you wish to quit smoking, help is available. For free tobacco cessation program offerings call the [facility] at 940-607-0064 or Live Well Line at 818-052-1295. You may also visit [email or web address] or email [email or web address] for more information on other programs.        Weight (lb) < 160 lb (72.6 kg) (pt-stated)      Exercising a lot and building muscle more than in the past but she would like to lose weight, has to work harder on calorie deficit         This is a list of the screening recommended for you and due dates:  Health Maintenance  Topic Date Due   DEXA scan (bone density measurement)  Never done   Flu Shot  03/21/2024   Mammogram  09/03/2024   Screening for Lung Cancer  09/24/2024   Medicare Annual Wellness Visit  02/26/2025   DTaP/Tdap/Td vaccine (2  - Td or Tdap) 09/02/2025   Cologuard (Stool DNA test)  07/14/2026   Pneumococcal Vaccine for age over 42  Completed   Hepatitis C Screening  Completed   Zoster (Shingles) Vaccine  Completed   Hepatitis B Vaccine  Aged Out   HPV Vaccine  Aged Out   Meningitis B Vaccine  Aged Out   COVID-19 Vaccine  Discontinued    See this link for instructions and documents that will help you complete advance care planning/advanced directives - including designating a medical power of attorney, completing a living will, etc [email or web address]   Try a magnesium supplement for your stress and muscle spasms - look for different forms over the counter and see how you tolerate it.  I will also refer you to our Social Worker to help you have someone to talk to and resources for stress and all you're dealing with

## 2024-02-28 ENCOUNTER — Telehealth: Payer: Self-pay

## 2024-02-28 NOTE — Progress Notes (Signed)
 Complex Care Management Note Care Guide Note  02/28/2024 Name: Jenna Meyers MRN: [id number] DOB: 1955/09/23   Complex Care Management Outreach Attempts: An unsuccessful telephone outreach was attempted today to offer the patient information about available complex care management services.  Follow Up Plan:  Additional outreach attempts will be made to offer the patient complex care management information and services.   Encounter Outcome:  No Answer  Sig Jeoffrey Buffalo , RMA     [facility]  [facility], [facility] Guide  Direct Dial: [phone]  Website: [email or web address]

## 2024-03-03 NOTE — Progress Notes (Unsigned)
 Complex Care Management Note Care Guide Note  03/03/2024 Name: Jenna Meyers MRN: [id number] DOB: November 17, 1955   Complex Care Management Outreach Attempts: A second unsuccessful outreach was attempted today to offer the patient with information about available complex care management services.  Follow Up Plan:  Additional outreach attempts will be made to offer the patient complex care management information and services.   Encounter Outcome:  No Answer  Jeoffrey Buffalo , RMA       [facility] Guide  Direct Dial: [phone]  Website: [email or web address]

## 2024-03-04 NOTE — Progress Notes (Signed)
 Complex Care Management Note  Care Guide Note 03/04/2024 Name: Amaani Guilbault MRN: [id number] DOB: 1956/06/07  Icela Glymph is a 68 y.o. year old female who sees Tapia, [name], PA-C for primary care. I reached out to Comer Sieve by phone today to offer complex care management services.  Ms. Castrillo was given information about Complex Care Management services today including:   The Complex Care Management services include support from the care team which includes your Nurse Care Manager, Clinical Social Worker, or Pharmacist.  The Complex Care Management team is here to help remove barriers to the health concerns and goals most important to you. Complex Care Management services are voluntary, and the patient may decline or stop services at any time by request to their care team member.   Complex Care Management Consent Status: Patient agreed to services and verbal consent obtained.   Follow up plan:  Telephone appointment with complex care management team member scheduled for:  03/10/2024  Encounter Outcome:  Patient Scheduled  Jeoffrey Buffalo , RMA     [place]  [facility], [facility] Guide  Direct Dial: [phone]  Website: [email or web address]

## 2024-03-10 ENCOUNTER — Other Ambulatory Visit: Payer: Self-pay | Admitting: Licensed Clinical Social Worker

## 2024-03-11 NOTE — Patient Outreach (Signed)
 Complex Care Management   Visit Note  03/11/2024  Name:  Jenna Meyers MRN: [id number] DOB: 08-22-1955  Situation: Referral received for Complex Care Management related to Mental/Behavioral Health diagnosis depressed mood I obtained verbal consent from Patient.  Visit completed with Jenna Meyers   on the phone. Jenna Meyers requested assistance with locating in person therapy in the [place] area. Pt provided with three local providers accepting new patients.   Background:   Past Medical History:  Diagnosis Date   Anxiety 10/09/2012   Aortic insufficiency 03/19/2018   ECHO July 2019   Arthritis    back   Depression    Emphysema lung (HCC)    Emphysema of lung (HCC)    Fibromyalgia    Grade I diastolic dysfunction 03/19/2018   ECHO July 2019   Hyperlipidemia    Hypertension    IBS (irritable bowel syndrome)    Loss of consciousness (HCC) 03/09/2019   Lumbago    Lumbar canal stenosis 09/14/2017   Moderate to severe, MRI Nov 2018   LVH (left ventricular hypertrophy) 03/19/2018   ECHO July 2019   Mitral regurgitation 03/19/2018   ECHO July 2019   Tobacco abuse    Vitamin D  deficiency disease     Assessment: Patient Reported Symptoms:  Cognitive Cognitive Status: No symptoms reported Cognitive/Intellectual Conditions Management [RPT]: None reported or documented in medical history or problem list      Neurological Neurological Review of Symptoms: No symptoms reported    HEENT HEENT Symptoms Reported: No symptoms reported      Cardiovascular Cardiovascular Symptoms Reported: No symptoms reported Does patient have uncontrolled Hypertension?: No    Respiratory Respiratory Symptoms Reported: No symptoms reported    Endocrine Endocrine Symptoms Reported: No symptoms reported Is patient diabetic?: No    Gastrointestinal Gastrointestinal Symptoms Reported: No symptoms reported      Genitourinary Genitourinary Symptoms Reported: No symptoms reported    Integumentary  Integumentary Symptoms Reported: No symptoms reported    Musculoskeletal Musculoskelatal Symptoms Reviewed: No symptoms reported   Falls in the past year?: No Number of falls in past year: 1 or less Was there an injury with Fall?: No Fall Risk Category Calculator: 0 Patient Fall Risk Level: Low Fall Risk    Psychosocial       Quality of Family Relationships: supportive Jenna Meyers you feel physically threatened by others?: No      02/27/2024   10:15 AM  Depression screen PHQ 2/9  Decreased Interest 0  Down, Depressed, Hopeless 0  PHQ - 2 Score 0  Altered sleeping 3  Tired, decreased energy 0  Change in appetite 0  Feeling bad or failure about yourself  0  Trouble concentrating 0  Moving slowly or fidgety/restless 0  Suicidal thoughts 0  PHQ-9 Score 3    There were no vitals filed for this visit.  Medications Reviewed Today     Reviewed by Jenna Odor, LCSW (Social Worker) on 03/11/24 at 1126  Med List Status: <None>   Medication Order Taking? Sig Documenting Provider Last Dose Status Informant  donepezil (ARICEPT) 5 MG tablet [id number]  Take 1 tablet by mouth at bedtime. [provider]  Active   DULoxetine  (CYMBALTA ) 30 MG capsule [id number]  Take 1 capsule (30 mg total) by mouth 2 (two) times daily. Meyers, [name], PA-C  Active   fluticasone  (FLONASE ) 50 MCG/ACT nasal spray [id number]  Place 2 sprays into both nostrils daily. Meyers, [name], PA-C  Active   gabapentin  (NEURONTIN ) 300 MG capsule  [id number]  Take 1 capsule (300 mg total) by mouth 3 (three) times daily. Meyers, [name], PA-C  Active   loratadine  (CLARITIN ) 10 MG tablet [id number]  Take 1 tablet (10 mg total) by mouth daily.  Patient not taking: Reported on 02/27/2024   Meyers, [name], PA-C  Active   meloxicam  (MOBIC ) 7.5 MG tablet [id number]  Take 1 tablet (7.5 mg total) by mouth daily. Meyers, [name], PA-C  Active   rOPINIRole  (REQUIP ) 0.25 MG tablet [id number]  Take 1 tablet (0.25 mg total) by mouth at bedtime.  Jenna Mliss FALCON, Jenna Meyers  Active   Vitamin D , Ergocalciferol , (DRISDOL ) 1.25 MG (50000 UNIT) CAPS capsule [id number]  Take 1 capsule (50,000 Units total) by mouth every 7 (seven) days.  Patient not taking: Reported on 02/27/2024   Jenna Fend, Jenna Meyers  Active   Med List Note Jenna Chrissie MATSU, Jenna Meyers 07/22/15 1439): UDS 07/22/2015            Recommendation:   Jenna. Huertas is encouraged to contact therapy providers given and schedule an appt/consultation   Follow Up Plan:   Closing From:  Complex Care Management  Jenna Meyers MSW, LCSW Licensed Clinical Social Worker  [facility], Population Health Direct Dial: [phone]  Fax: [phone]

## 2024-03-11 NOTE — Patient Instructions (Signed)
 Visit Information  Thank you for taking time to visit with me today. Please don't hesitate to contact me if I can be of assistance to you before our next scheduled appointment.  Your next care management appointment is no further scheduled appointments.    Closing From: Complex Care Management.  Please call the care guide team at [phone] if you need to cancel, schedule, or reschedule an appointment.   Please call 911 if you are experiencing a Mental Health or Behavioral Health Crisis or need someone to talk to.  Fletcher Humble MSW, LCSW Licensed Clinical Social Worker  [facility], Population Health Direct Dial: [phone]  Fax: [phone]

## 2024-03-16 ENCOUNTER — Other Ambulatory Visit: Payer: Self-pay | Admitting: Nurse Practitioner

## 2024-03-16 DIAGNOSIS — R252 Cramp and spasm: Secondary | ICD-10-CM

## 2024-03-17 NOTE — Telephone Encounter (Signed)
 Requested Prescriptions  Pending Prescriptions Disp Refills   rOPINIRole  (REQUIP ) 0.25 MG tablet [Pharmacy Med Name: rOPINIRole  HCL 0.25 MG TABLET] 90 tablet 2    Sig: TAKE 1 TABLET BY MOUTH AT BEDTIME     Neurology:  Parkinsonian Agents Passed - 03/17/2024  4:10 PM      Passed - Last BP in normal range    BP Readings from Last 1 Encounters:  02/27/24 130/82         Passed - Last Heart Rate in normal range    Pulse Readings from Last 1 Encounters:  02/27/24 92         Passed - Valid encounter within last 12 months    Recent Outpatient Visits           2 weeks ago Medicare annual wellness visit, subsequent   [facility] Leavy Mole, PA-C   3 weeks ago Leg cramps   [facility] Gareth Mliss FALCON, FNP   2 months ago Functional diarrhea   [facility] Bernardo Fend, DO   4 months ago Neuropathy involving both lower extremities   [facility] Leavy Mole, PA-C   5 months ago Upper respiratory tract infection, unspecified type   [facility] Leavy Mole, PA-C

## 2024-04-04 ENCOUNTER — Ambulatory Visit: Admitting: Family Medicine

## 2024-04-08 ENCOUNTER — Ambulatory Visit: Payer: Self-pay | Admitting: Family Medicine

## 2024-04-08 ENCOUNTER — Ambulatory Visit
Admission: RE | Admit: 2024-04-08 | Discharge: 2024-04-08 | Disposition: A | Discharge status: Home / Self Care | Source: Ambulatory Visit | Attending: Family Medicine | Admitting: Family Medicine

## 2024-04-08 DIAGNOSIS — Z780 Asymptomatic menopausal state: Secondary | ICD-10-CM | POA: Diagnosis present

## 2024-04-11 ENCOUNTER — Telehealth: Payer: Self-pay

## 2024-04-11 NOTE — Telephone Encounter (Signed)
 Copied from CRM #[id number]. Topic: Clinical - Lab/Test Results >> Apr 11, 2024 11:20 AM Emylou G wrote: Reason for CRM: Patient would like to get Vitamin B labs done?. If you can order and schedule

## 2024-04-14 ENCOUNTER — Ambulatory Visit: Admitting: Family Medicine

## 2024-04-17 ENCOUNTER — Telehealth: Payer: Self-pay | Admitting: Family Medicine

## 2024-04-17 NOTE — Telephone Encounter (Unsigned)
 Copied from CRM [id number]. Topic: Referral - Request for Referral >> Apr 17, 2024 11:06 AM Wess RAMAN wrote: Did the patient discuss referral with their provider in the last year? Yes (If No - schedule appointment) (If Yes - send message)  Appointment offered? No  Type of order/referral and detailed reason for visit: Gastroenterology   Preference of office, provider, location:  [facility] - Gastroenterology [facility] [address] [place], [place] [address] Phone: [phone] Fax : [phone]  If referral order, have you been seen by this specialty before? No (If Yes, this issue or another issue? When? Where?  Can we respond through MyChart? Yes

## 2024-04-18 ENCOUNTER — Ambulatory Visit: Payer: Self-pay

## 2024-04-18 NOTE — Telephone Encounter (Signed)
 FYI Only or Action Required?: Action required by provider: referral request.  Patient was last seen in primary care on 02/20/2024 by Gareth Mliss FALCON, FNP.  Called Nurse Triage reporting Diarrhea.  Symptoms began several days ago.  Interventions attempted: Dietary changes.  Symptoms are: gradually worsening.  Triage Disposition: See HCP Within 4 Hours (Or PCP Triage)  Patient/caregiver understands and will follow disposition?: Unsure    Message from [place] L sent at 04/18/2024 12:38 PM EDT  Summary: gastric issues, exhausted   Patient states she has gastric issues that have been worsening and feeling weaker and more exhausted. Patient states she has been having a lot of frequent loose stools & beige color, a lot of irritation from intestines down.  Patient requesting appointment to see [facility] or referral to be sent to GI due to symptoms.  Please reach out to patient, [phone]         Reason for Disposition  [1] SEVERE diarrhea (e.g., 7 or more times / day more than normal) AND [2] age > 60 years  Answer Assessment - Initial Assessment Questions Patient states she has IBS. Patient states symptoms feel worse now with her feeling extremely drained with brain fog and a lack of appetite. Patient denies, mucous or blood in stool. Patient states she is going to try a diet for people with IBS for symptoms. Offered patient an appointment today in office and she declined appointment. Pt also requesting information about a referral to GI. Will route to office for follow up.   1. DIARRHEA SEVERITY: How bad is the diarrhea? How many more stools have you had in the past 24 hours than normal?      10  episodes  2. ONSET: When did the diarrhea begin?      Worsened in the last 4 days.  3. STOOL DESCRIPTION:  How loose or watery is the diarrhea? What is the stool color? Is there any blood or mucous in the stool?     Light brown color 4. VOMITING: Are you also vomiting? If Yes, ask:  How many times in the past 24 hours?      No vomiting  5. ABDOMEN PAIN: Are you having any abdomen pain? If Yes, ask: What does it feel like? (e.g., crampy, dull, intermittent, constant)      Yes,  6. ABDOMEN PAIN SEVERITY: If present, ask: How bad is the pain?  (e.g., Scale 1-10; mild, moderate, or severe)     3/10  7. ORAL INTAKE: If vomiting, Have you been able to drink liquids? How much liquids have you had in the past 24 hours?     Patient states she has been able to drink lots of water.  8. HYDRATION: Any signs of dehydration? (e.g., dry mouth [not just dry lips], too weak to stand, dizziness, new weight loss) When did you last urinate?     Stays hydrated with water.  9. OTHER SYMPTOMS: Do you have any other symptoms? (e.g., fever, blood in stool)      States she just feel drained  Protocols used: Diarrhea-A-AH

## 2024-04-22 DIAGNOSIS — R488 Other symbolic dysfunctions: Secondary | ICD-10-CM | POA: Diagnosis not present

## 2024-04-23 DIAGNOSIS — E538 Deficiency of other specified B group vitamins: Secondary | ICD-10-CM | POA: Diagnosis not present

## 2024-04-23 DIAGNOSIS — Z1331 Encounter for screening for depression: Secondary | ICD-10-CM | POA: Diagnosis not present

## 2024-04-23 DIAGNOSIS — R4189 Other symptoms and signs involving cognitive functions and awareness: Secondary | ICD-10-CM | POA: Diagnosis not present

## 2024-04-23 NOTE — Telephone Encounter (Signed)
 Phone rang several times and then hung up. Pt will need an appointment

## 2024-04-24 DIAGNOSIS — R488 Other symbolic dysfunctions: Secondary | ICD-10-CM | POA: Diagnosis not present

## 2024-04-28 DIAGNOSIS — R488 Other symbolic dysfunctions: Secondary | ICD-10-CM | POA: Diagnosis not present

## 2024-04-29 DIAGNOSIS — R488 Other symbolic dysfunctions: Secondary | ICD-10-CM | POA: Diagnosis not present

## 2024-04-30 ENCOUNTER — Ambulatory Visit: Admitting: Family Medicine

## 2024-05-01 ENCOUNTER — Ambulatory Visit (INDEPENDENT_AMBULATORY_CARE_PROVIDER_SITE_OTHER): Admitting: Family Medicine

## 2024-05-01 ENCOUNTER — Encounter: Payer: Self-pay | Admitting: Family Medicine

## 2024-05-01 VITALS — BP 122/76 | HR 91 | Resp 16 | Ht 64.0 in | Wt 188.0 lb

## 2024-05-01 DIAGNOSIS — F331 Major depressive disorder, recurrent, moderate: Secondary | ICD-10-CM | POA: Diagnosis not present

## 2024-05-01 DIAGNOSIS — M79605 Pain in left leg: Secondary | ICD-10-CM

## 2024-05-01 DIAGNOSIS — M48061 Spinal stenosis, lumbar region without neurogenic claudication: Secondary | ICD-10-CM

## 2024-05-01 DIAGNOSIS — M545 Low back pain, unspecified: Secondary | ICD-10-CM | POA: Diagnosis not present

## 2024-05-01 DIAGNOSIS — E559 Vitamin D deficiency, unspecified: Secondary | ICD-10-CM | POA: Diagnosis not present

## 2024-05-01 DIAGNOSIS — M79604 Pain in right leg: Secondary | ICD-10-CM | POA: Diagnosis not present

## 2024-05-01 DIAGNOSIS — G5793 Unspecified mononeuropathy of bilateral lower limbs: Secondary | ICD-10-CM

## 2024-05-01 DIAGNOSIS — G8929 Other chronic pain: Secondary | ICD-10-CM

## 2024-05-01 DIAGNOSIS — R748 Abnormal levels of other serum enzymes: Secondary | ICD-10-CM | POA: Diagnosis not present

## 2024-05-01 DIAGNOSIS — M25552 Pain in left hip: Secondary | ICD-10-CM | POA: Diagnosis not present

## 2024-05-01 DIAGNOSIS — M791 Myalgia, unspecified site: Secondary | ICD-10-CM | POA: Diagnosis not present

## 2024-05-01 DIAGNOSIS — M797 Fibromyalgia: Secondary | ICD-10-CM

## 2024-05-01 DIAGNOSIS — E538 Deficiency of other specified B group vitamins: Secondary | ICD-10-CM

## 2024-05-01 MED ORDER — DULOXETINE HCL 30 MG PO CPEP: 30.0000 mg | ORAL_CAPSULE | Freq: Two times a day (BID) | ORAL | 1 refills | Status: AC

## 2024-05-01 MED ORDER — MELOXICAM 7.5 MG PO TABS
7.5000 mg | ORAL_TABLET | Freq: Every day | ORAL | 1 refills | Status: AC
Start: 2024-05-01 — End: ?

## 2024-05-01 NOTE — Progress Notes (Signed)
 "   Patient ID: Jenna Meyers, female    DOB: 06-25-1956, 68 y.o.   MRN: [id number]  PCP: Leavy Mole, PA-C  Chief Complaint  Patient presents with   Fatigue   Restless Legs    Taking rx at night and sleeps good, but wakes up with legs aching like she ran a marathon    Subjective:   Jenna Meyers is a 68 y.o. female, presents to clinic with CC of the following:  HPI  Here for f/up on leg/muscle pain RLS got better with use of medication for Jenna Meyers with recent visit, she was told to /fup on abnormal labs and did not, she returns today reporting better sleep at night but severe and worsening pain in am like she ran a marathon, it does get better with activity Discussed the use of AI scribe software for clinical note transcription with the patient, who gave verbal consent to proceed.  History of Present Illness Jenna Meyers is a 68 year old female with restless legs syndrome who presents with morning muscle aches and elevated creatinine kinase levels.  Morning myalgias and elevated creatinine kinase - Significant muscle aches upon waking, described as feeling like she has 'ran a marathon' - Symptoms have persisted for several months - Pain primarily located in calves, thighs, and buttocks - Aches improve after walking for 15-20 minutes - No muscle aches at night - Creatinine kinase levels previously found to be elevated - No swelling in the legs  Restless legs syndrome - Currently taking medication for restless legs syndrome with improvement in nighttime symptoms - No nighttime muscle aches  Pharmacologic and supplement use - Takes Cymbalta  twice daily - Uses magnesium calcium  supplements daily without significant improvement in muscle aches - History of low folate levels; recently resumed folate supplementation  Lower back discomfort and neurogenic claudication - History of spinal stenosis and neurogenic claudication - Recent lower back discomfort without acute  pain - No worsening of symptoms with walking or exercise  Cerebrovascular history - History of stroke several months ago - per pt and neurology - she says she felt it  - Recent imaging did not show an acute stroke - unclear when sx were and OV and imaging Reviewed with her head CT and other recent imaging results in chart  Functional status and lifestyle - Maintains an active lifestyle, frequently helps others in her facility and walks regularly  Gastrointestinal symptoms - Follows a FODMAP diet, which has resolved previous gastrointestinal issues    Patient Active Problem List   Diagnosis Date Noted   Encounter for screening for malignant neoplasm of lung in former smoker who quit in past 15 years with 30 pack year history or greater 08/28/2023   Memory problem 08/15/2022   Family history of Alzheimer's disease 08/15/2022   Mild episode of recurrent major depressive disorder (HCC) 09/26/2021   Hyperlipidemia 08/12/2019   Vitamin D  deficiency disease 08/12/2019   Hallucinations, visual 03/09/2019   Moderate episode of recurrent major depressive disorder (HCC) 04/14/2018   Aortic insufficiency 03/19/2018   LVH (left ventricular hypertrophy) 03/19/2018   Grade I diastolic dysfunction 03/19/2018   Mitral regurgitation 03/19/2018   Lumbar canal stenosis 09/14/2017   Emphysema of lung (HCC) 05/31/2017   Palpitations 05/30/2017   Medication monitoring encounter 02/09/2016   Essential hypertension, benign 05/17/2015   Acute allergic reaction 05/17/2015   Situational stress 05/17/2015   Obesity 03/03/2015   Abnormal thyroid  blood test 03/03/2015   History of tobacco abuse 03/03/2015   Lumbago  Fibromyalgia    Anxiety 10/09/2012      Current Outpatient Medications:    donepezil (ARICEPT) 5 MG tablet, Take 1 tablet by mouth at bedtime., Disp: , Rfl:    fluticasone  (FLONASE ) 50 MCG/ACT nasal spray, Place 2 sprays into both nostrils daily., Disp: 16 g, Rfl: 6   gabapentin   (NEURONTIN ) 300 MG capsule, Take 1 capsule (300 mg total) by mouth 3 (three) times daily., Disp: 270 capsule, Rfl: 1   rOPINIRole  (REQUIP ) 0.25 MG tablet, TAKE 1 TABLET BY MOUTH AT BEDTIME, Disp: 90 tablet, Rfl: 2   DULoxetine  (CYMBALTA ) 30 MG capsule, Take 1 capsule (30 mg total) by mouth 2 (two) times daily., Disp: 180 capsule, Rfl: 1   loratadine  (CLARITIN ) 10 MG tablet, Take 1 tablet (10 mg total) by mouth daily. (Patient not taking: Reported on 05/01/2024), Disp: 90 tablet, Rfl: 1   meloxicam  (MOBIC ) 7.5 MG tablet, Take 1 tablet (7.5 mg total) by mouth daily., Disp: 90 tablet, Rfl: 1   Vitamin D , Ergocalciferol , (DRISDOL ) 1.25 MG (50000 UNIT) CAPS capsule, Take 1 capsule (50,000 Units total) by mouth every 7 (seven) days. (Patient not taking: Reported on 05/01/2024), Disp: 12 capsule, Rfl: 0   No Known Allergies   Social History   Tobacco Use   Smoking status: Former    Current packs/day: 0.00    Average packs/day: 1.5 packs/day for 34.0 years (51.0 ttl pk-yrs)    Types: Cigarettes    Start date: 65    Quit date: 2020    Years since quitting: 5.6   Smokeless tobacco: Never   Tobacco comments:    been given a Rx for Chantix  quit for 1 yr and started back  Vaping Use   Vaping status: Never Used  Substance Use Topics   Alcohol use: Yes    Comment: rare   Drug use: No      Chart Review Today: I personally reviewed active problem list, medication list, allergies, family history, social history, health maintenance, notes from last encounter, lab results, imaging with the patient/caregiver today.   Review of Systems  Constitutional: Negative.   HENT: Negative.    Eyes: Negative.   Respiratory: Negative.    Cardiovascular: Negative.   Gastrointestinal: Negative.   Endocrine: Negative.   Genitourinary: Negative.   Musculoskeletal: Negative.   Skin: Negative.   Allergic/Immunologic: Negative.   Neurological: Negative.   Hematological: Negative.   Psychiatric/Behavioral:  Negative.    All other systems reviewed and are negative.      Objective:   Vitals:   05/01/24 0956  BP: 122/76  Pulse: 91  Resp: 16  SpO2: 96%  Weight: 188 lb (85.3 kg)  Height: 5' 4 (1.626 m)    Body mass index is 32.27 kg/m.  Physical Exam Vitals and nursing note reviewed.  Constitutional:      General: She is not in acute distress.    Appearance: Normal appearance. She is well-developed, well-groomed and overweight. She is not ill-appearing, toxic-appearing or diaphoretic.  HENT:     Head: Normocephalic and atraumatic.     Right Ear: External ear normal.     Left Ear: External ear normal.     Nose: Nose normal.  Eyes:     General: No scleral icterus.       Right eye: No discharge.        Left eye: No discharge.     Conjunctiva/sclera: Conjunctivae normal.  Neck:     Trachea: No tracheal deviation.  Cardiovascular:  Rate and Rhythm: Normal rate.  Pulmonary:     Effort: Pulmonary effort is normal. No respiratory distress.     Breath sounds: No stridor.  Musculoskeletal:     Comments: Muscle compartments of bilateral lower extremity soft non-tender No swelling redness rash or bruising  Skin:    General: Skin is warm and dry.     Findings: No rash.  Neurological:     Mental Status: She is alert.     Motor: No abnormal muscle tone.     Coordination: Coordination normal.     Gait: Gait normal.  Psychiatric:        Attention and Perception: Attention normal.        Mood and Affect: Mood normal.        Speech: Speech normal.        Behavior: Behavior normal. Behavior is cooperative.        Cognition and Memory: Cognition is not impaired. Memory is impaired.     Comments: Forgetful sometimes in the middle of a sentence loses train of thought      Results for orders placed or performed in visit on 02/20/24  Vitamin B12   Collection Time: 02/20/24 12:06 PM  Result Value Ref Range   Vitamin B-12 271 200 - 1,100 pg/mL  Magnesium   Collection Time:  02/20/24 12:06 PM  Result Value Ref Range   Magnesium 2.4 1.5 - 2.5 mg/dL  Thyroid  Panel With TSH   Collection Time: 02/20/24 12:06 PM  Result Value Ref Range   T3 Uptake 29 22 - 35 %   T4, Total 7.0 5.1 - 11.9 mcg/dL   Free Thyroxine Index 2.0 1.4 - 3.8   TSH 2.07 0.40 - 4.50 mIU/L  VITAMIN D  25 Hydroxy (Vit-D Deficiency, Fractures)   Collection Time: 02/20/24 12:06 PM  Result Value Ref Range   Vit D, 25-Hydroxy 34 30 - 100 ng/mL  CK (Creatine Kinase)   Collection Time: 02/20/24 12:06 PM  Result Value Ref Range   Total CK 396 (H) 20 - 243 U/L  Comprehensive metabolic panel with GFR   Collection Time: 02/20/24 12:06 PM  Result Value Ref Range   Glucose, Bld 82 65 - 99 mg/dL   BUN 16 7 - 25 mg/dL   Creat 9.09 9.49 - 8.94 mg/dL   eGFR 71 > OR = 60 fO/fpw/8.26f7   BUN/Creatinine Ratio SEE NOTE: 6 - 22 (calc)   Sodium 140 135 - 146 mmol/L   Potassium 4.8 3.5 - 5.3 mmol/L   Chloride 107 98 - 110 mmol/L   CO2 25 20 - 32 mmol/L   Calcium  9.9 8.6 - 10.4 mg/dL   Total Protein 6.5 6.1 - 8.1 g/dL   Albumin 4.2 3.6 - 5.1 g/dL   Globulin 2.3 1.9 - 3.7 g/dL (calc)   AG Ratio 1.8 1.0 - 2.5 (calc)   Total Bilirubin 0.4 0.2 - 1.2 mg/dL   Alkaline phosphatase (APISO) 60 37 - 153 U/L   AST 25 10 - 35 U/L   ALT 18 6 - 29 U/L  Iron, TIBC and Ferritin Panel   Collection Time: 02/20/24 12:06 PM  Result Value Ref Range   Iron 100 45 - 160 mcg/dL   TIBC 688 749 - 549 mcg/dL (calc)   %SAT 32 16 - 45 % (calc)   Ferritin 58 16 - 288 ng/mL       Assessment & Plan:      ICD-10-CM   1. Myalgia  M79.10 Comprehensive metabolic  panel with GFR    CBC with Differential/Platelet    Magnesium    VITAMIN D  25 Hydroxy (Vit-D Deficiency, Fractures)    CK    B12 and Folate Panel    cyclobenzaprine  (FLEXERIL ) 5 MG tablet   bilateral leg pain every morning after starting medication for RLS, sleeping better but terrible MSK leg pain, d/c med at night, try muscle relaxers, labs    2. Moderate episode  of recurrent major depressive disorder (HCC)  F33.1 DULoxetine  (CYMBALTA ) 30 MG capsule   mood improved and stable with current meds    3. Spinal stenosis of lumbar region without neurogenic claudication  M48.061 DULoxetine  (CYMBALTA ) 30 MG capsule    cyclobenzaprine  (FLEXERIL ) 5 MG tablet   consider spine f/up or PM&R    4. Chronic left hip pain  M25.552 DULoxetine  (CYMBALTA ) 30 MG capsule   G89.29 meloxicam  (MOBIC ) 7.5 MG tablet    5. Neuropathy involving both lower extremities  G57.93 DULoxetine  (CYMBALTA ) 30 MG capsule    Comprehensive metabolic panel with GFR    CBC with Differential/Platelet    Magnesium    B12 and Folate Panel    cyclobenzaprine  (FLEXERIL ) 5 MG tablet   still has some defiencies    6. Fibromyalgia  M79.7 DULoxetine  (CYMBALTA ) 30 MG capsule    cyclobenzaprine  (FLEXERIL ) 5 MG tablet    7. Chronic low back pain, unspecified back pain laterality, unspecified whether sciatica present  M54.50 meloxicam  (MOBIC ) 7.5 MG tablet   G89.29 cyclobenzaprine  (FLEXERIL ) 5 MG tablet    8. Elevated CK  R74.8 Comprehensive metabolic panel with GFR    CK    9. Folate deficiency  E53.8 CBC with Differential/Platelet    B12 and Folate Panel   managing with neuro    10. Pain in both lower extremities  M79.604    M79.605    recheck labs est CK, no current weakness, swelling, tenderness, consider low back etiologies, r/o deficiencies, unclear why CK was elevated    11. Vitamin D  deficiency disease  E55.9 VITAMIN D  25 Hydroxy (Vit-D Deficiency, Fractures)    12. Chronic bilateral low back pain, unspecified whether sciatica present  M54.50 cyclobenzaprine  (FLEXERIL ) 5 MG tablet   G89.29    consider recheck low back sx which could be causing leg pain - spine specialists      Get separate f/up appt for other acute/chronic concerns - she mentions wanting to lose weight.  Return for 1-2 week f/up for weight concerns/legpain f/up.    [name], PA-C 05/01/24 10:28 AM  "

## 2024-05-01 NOTE — Patient Instructions (Signed)
 We will recheck your labs today. Try stopping requip  and try flexeril  instead. I will refer you to a spine/ortho specialists for your low back  We need to follow up in office about this and your other concerns in the next 1-2 weeks.

## 2024-05-03 ENCOUNTER — Encounter: Payer: Self-pay | Admitting: Family Medicine

## 2024-05-03 MED ORDER — CYCLOBENZAPRINE HCL 5 MG PO TABS: 5.0000 mg | ORAL_TABLET | Freq: Three times a day (TID) | ORAL | 1 refills | Status: AC | PRN

## 2024-05-05 ENCOUNTER — Ambulatory Visit: Payer: Self-pay

## 2024-05-05 ENCOUNTER — Other Ambulatory Visit: Payer: Self-pay

## 2024-05-05 DIAGNOSIS — R748 Abnormal levels of other serum enzymes: Secondary | ICD-10-CM

## 2024-05-05 DIAGNOSIS — M791 Myalgia, unspecified site: Secondary | ICD-10-CM

## 2024-05-05 NOTE — Progress Notes (Signed)
 CK given to [place] to add on

## 2024-05-06 LAB — COMPREHENSIVE METABOLIC PANEL WITH GFR
AG Ratio: 1.6 (calc) (ref 1.0–2.5)
ALT: 11 U/L (ref 6–29)
AST: 16 U/L (ref 10–35)
Albumin: 4.1 g/dL (ref 3.6–5.1)
Alkaline phosphatase (APISO): 55 U/L (ref 37–153)
BUN: 20 mg/dL (ref 7–25)
CO2: 26 mmol/L (ref 20–32)
Calcium: 9.2 mg/dL (ref 8.6–10.4)
Chloride: 107 mmol/L (ref 98–110)
Creat: 0.85 mg/dL (ref 0.50–1.05)
Globulin: 2.5 g/dL (ref 1.9–3.7)
Glucose, Bld: 79 mg/dL (ref 65–99)
Potassium: 4.3 mmol/L (ref 3.5–5.3)
Sodium: 139 mmol/L (ref 135–146)
Total Bilirubin: 0.4 mg/dL (ref 0.2–1.2)
Total Protein: 6.6 g/dL (ref 6.1–8.1)
eGFR: 76 mL/min/1.73m2 (ref >=60)

## 2024-05-06 LAB — CBC WITH DIFFERENTIAL/PLATELET
Absolute Lymphocytes: 2139 {cells}/uL (ref 850–3900)
Absolute Monocytes: 476 {cells}/uL (ref 200–950)
Basophils Absolute: 69 {cells}/uL (ref 0–200)
Basophils Relative: 1 %
Eosinophils Absolute: 97 {cells}/uL (ref 15–500)
Eosinophils Relative: 1.4 %
HCT: 37.9 % (ref 35.0–45.0)
Hemoglobin: 12.3 g/dL (ref 11.7–15.5)
MCH: 30.5 pg (ref 27.0–33.0)
MCHC: 32.5 g/dL (ref 32.0–36.0)
MCV: 94 fL (ref 80.0–100.0)
MPV: 10.8 fL (ref 7.5–12.5)
Monocytes Relative: 6.9 %
Neutro Abs: 4119 {cells}/uL (ref 1500–7800)
Neutrophils Relative %: 59.7 %
Platelets: 316 Thousand/uL (ref 140–400)
RBC: 4.03 Million/uL (ref 3.80–5.10)
RDW: 12.5 % (ref 11.0–15.0)
Total Lymphocyte: 31 %
WBC: 6.9 Thousand/uL (ref 3.8–10.8)

## 2024-05-06 LAB — CK: Total CK: 68 U/L (ref 20–243)

## 2024-05-06 LAB — MAGNESIUM: Magnesium: 2 mg/dL (ref 1.5–2.5)

## 2024-05-06 LAB — TEST AUTHORIZATION

## 2024-05-06 LAB — VITAMIN D 25 HYDROXY (VIT D DEFICIENCY, FRACTURES): Vit D, 25-Hydroxy: 26 ng/mL — ABNORMAL LOW (ref 30–100)

## 2024-05-06 LAB — B12 AND FOLATE PANEL
Folate: 10.8 ng/mL
Vitamin B-12: 254 pg/mL (ref 200–1100)

## 2024-05-08 DIAGNOSIS — R488 Other symbolic dysfunctions: Secondary | ICD-10-CM | POA: Diagnosis not present

## 2024-05-09 ENCOUNTER — Ambulatory Visit: Payer: Self-pay

## 2024-05-09 NOTE — Telephone Encounter (Signed)
 Please schedule appointment.

## 2024-05-09 NOTE — Telephone Encounter (Signed)
 FYI Only or Action Required?: Action required by provider: request for appointment.  Patient was last seen in primary care on 05/01/2024 by Leavy Mole, PA-C.  Called Nurse Triage reporting Depression.  Symptoms began several weeks ago.  Interventions attempted: Nothing.  Symptoms are: gradually worsening.  Triage Disposition: See PCP When Office is Open (Within 3 Days)  Patient/caregiver understands and will follow disposition?: YesCopied from CRM #[id number]. Topic: Clinical - Red Word Triage >> May 09, 2024  3:23 PM Dedra B wrote: Red Word that prompted transfer to Nurse Triage: Pt experiencing depression. Warm transfer to nurse triage. Reason for Disposition  Requesting to talk with a counselor (mental health worker, psychiatrist, etc.)  Answer Assessment - Initial Assessment Questions Son doesn't take care of his kids. My grandson gets bit by another child. They have random women coming in and out of life. Situation is awful. My son gave my grandson 3 doses of ADD medication because he thought he was too rowdy.. They are always hungry. It's just so upsetting to see this. [facility] with lots of support.     1. CONCERN: What happened that made you call today?     Upset over Grandchildren  2. DEPRESSION SYMPTOM SCREENING: How are you feeling overall? (e.g., decreased energy, increased sleeping or difficulty sleeping, difficulty concentrating, feelings of sadness, guilt, hopelessness, or worthlessness)     Overwhelmed 3. RISK OF HARM - SUICIDAL IDEATION:  Do you ever have thoughts of hurting or killing yourself?  (e.g., yes, no, no but preoccupation with thoughts about death)     denies 4. RISK OF HARM - HOMICIDAL IDEATION:  Do you ever have thoughts of hurting or killing someone else?  (e.g., yes, no, no but preoccupation with thoughts about death)     denies 5. FUNCTIONAL IMPAIRMENT: How have things been going for you overall? Have you had more  difficulty than usual doing your normal daily activities?  (e.g., better, same, worse; self-care, school, work, interactions)     same 6. SUPPORT: Who is with you now? Who do you live with? Do you have family or friends who you can talk to?      Retirement center  7. THERAPIST: Do you have a counselor or therapist? If Yes, ask: What is their name?     yes 8. STRESSORS: Has there been any new stress or recent changes in your life?     Family   10. OTHER: Do you have any other physical symptoms right now? (e.g., fever)       denies  Protocols used: Depression-A-AH

## 2024-05-12 ENCOUNTER — Ambulatory Visit: Admitting: Family Medicine

## 2024-05-12 DIAGNOSIS — R488 Other symbolic dysfunctions: Secondary | ICD-10-CM | POA: Diagnosis not present

## 2024-05-12 NOTE — Telephone Encounter (Signed)
 Appt sch'd for 05/13/24

## 2024-05-13 ENCOUNTER — Ambulatory Visit (INDEPENDENT_AMBULATORY_CARE_PROVIDER_SITE_OTHER): Admitting: Family Medicine

## 2024-05-13 ENCOUNTER — Encounter: Payer: Self-pay | Admitting: Family Medicine

## 2024-05-13 ENCOUNTER — Telehealth: Payer: Self-pay | Admitting: *Deleted

## 2024-05-13 VITALS — BP 130/68 | HR 88 | Resp 16 | Ht 64.0 in | Wt 188.0 lb

## 2024-05-13 DIAGNOSIS — F419 Anxiety disorder, unspecified: Secondary | ICD-10-CM

## 2024-05-13 DIAGNOSIS — Z818 Family history of other mental and behavioral disorders: Secondary | ICD-10-CM

## 2024-05-13 DIAGNOSIS — M791 Myalgia, unspecified site: Secondary | ICD-10-CM | POA: Diagnosis not present

## 2024-05-13 DIAGNOSIS — F331 Major depressive disorder, recurrent, moderate: Secondary | ICD-10-CM | POA: Diagnosis not present

## 2024-05-13 DIAGNOSIS — M48061 Spinal stenosis, lumbar region without neurogenic claudication: Secondary | ICD-10-CM

## 2024-05-13 DIAGNOSIS — R195 Other fecal abnormalities: Secondary | ICD-10-CM | POA: Diagnosis not present

## 2024-05-13 DIAGNOSIS — G5793 Unspecified mononeuropathy of bilateral lower limbs: Secondary | ICD-10-CM | POA: Diagnosis not present

## 2024-05-13 MED ORDER — BUSPIRONE HCL 5 MG PO TABS: 5.0000 mg | ORAL_TABLET | Freq: Two times a day (BID) | ORAL | 1 refills | Status: AC | PRN

## 2024-05-13 NOTE — Patient Outreach (Signed)
 VBCI referral received to assist with mental health counseling and resources. Patient contacted CCM referral discussed, patient agreeable to initial assessment scheduled for 05/22/24 at 3:30pm.   Lenn Mean, LCSW [place]  [facility], [facility] Licensed Clinical Social Worker  Direct Dial: [phone]

## 2024-05-13 NOTE — Patient Instructions (Signed)
 Team Member Role and Specialty Contact Info Address  Meeler, [place], [place] (Physical Medicine and Rehabilitation) Phone: [phone] Fax: [phone] [address] [place] [address]  Lorelle Hussar, MD Consulting Physician (Orthopedic Surgery) Phone: [phone] Fax: [phone] [address] [place] [address]  Maree Jannett POUR, MD Consulting Physician (Neurology) Phone: [phone] Fax: [phone] [address] [facility] [place] [address]

## 2024-05-13 NOTE — Progress Notes (Unsigned)
 Patient ID: Jenna Meyers, female    DOB: [birth year], 68 y.o.   MRN: [id number]  PCP: Leavy Mole, PA-C  Chief Complaint  Patient presents with   Irritable Bowel Syndrome    Feels like worsening, diarrhea every time she eats.    Peripheral Neuropathy    Picking up Flexeril  today, legs aching. Feels like I've been running a marathon in my sleep   Anxiety    Would like to discuss short term medication    Subjective:   Jenna Meyers is a 68 y.o. female, presents to clinic with CC of the following:  HPI  Stress/family conflict worry about her grandkids increasing mental health symptoms  Not quite to the point of panic attacks, but severe sx, she wants to be able to stop crying    05/13/2024    9:25 AM 02/27/2024   10:15 AM 02/20/2024   11:06 AM  Depression screen PHQ 2/9  Decreased Interest 2 0 0  Down, Depressed, Hopeless 3 0 0  PHQ - 2 Score 5 0 0  Altered sleeping 0 3 0  Tired, decreased energy 0 0 0  Change in appetite 0 0 0  Feeling bad or failure about yourself  0 0 0  Trouble concentrating 0 0 0  Moving slowly or fidgety/restless 0 0 0  Suicidal thoughts 0 0 0  PHQ-9 Score 5 3 0  Difficult doing work/chores   Not difficult at all      05/13/2024    9:26 AM 02/27/2024   10:15 AM 02/20/2024   11:06 AM 03/26/2023    9:26 AM  GAD 7 : Generalized Anxiety Score  Nervous, Anxious, on Edge 3 0 0 2  Control/stop worrying 3 0 0 2  Worry too much - different things 3 0 0 2  Trouble relaxing 0 0 0 2  Restless 0 0 0 2  Easily annoyed or irritable 0 0 0 2  Afraid - awful might happen 3 0 0 2  Total GAD 7 Score 12 0 0 14  Anxiety Difficulty  Not difficult at all Not difficult at all Very difficult   GI changes still coming and going sx worse since having increased stress - referred to GI     Leg pains/myalgias q am - recent OV and labs regarding this Plan to d/c requip  and try flexeril  instead Labs showed improved normalized CK  Discussed the use of AI scribe  software for clinical note transcription with the patient, who gave verbal consent to proceed.  History of Present Illness Jenna Meyers is a 68 year old female with anxiety and depression who presents with concerns about her mental health due to family stressors.  Psychological distress - Significant emotional distress related to family stressors, particularly concerning her grandchildren - Frequent crying and episodes of anger - Feelings of being overwhelmed by the situation - Concern for the well-being of her grandchildren, ages 55 and four, who are experiencing neglect - Worry about unsanitary living conditions and lack of proper schooling for her grandson - Current treatment with Cymbalta  (duloxetine ) 30 mg twice daily for anxiety and depression - History of Lexapro  use for anxiety  Family-related stressors - Son and the mother of his children are separated and not fulfilling parental responsibilities - Darden, who is prescribed Ritalin, received an excessive dose from her son, resulting in a medication shortage - Grandson experiencing weight loss due to inadequate nutrition, as noted by psychiatrist - Attempted to provide nutritional support with Ensure,  but the mother has not followed through  Chronic pain and neurological symptoms - History of spinal stenosis and persistent leg pain, especially in the mornings - Current pain management with gabapentin  and Flexeril , which do not cause sedation - History of Requip  use for pain - Recent changes in lower back sensation  Gastrointestinal symptoms - History of bowel issues, partially managed with a FODMAP diet - Symptoms exacerbated by stress - Referral to gastroenterology for further evaluation, but has not yet followed up due to ongoing family stressors      Patient Active Problem List   Diagnosis Date Noted   Encounter for screening for malignant neoplasm of lung in former smoker who quit in past 15 years with 30 pack  year history or greater 08/28/2023   Memory problem 08/15/2022   Family history of Alzheimer's disease 08/15/2022   Mild episode of recurrent major depressive disorder 09/26/2021   Hyperlipidemia 08/12/2019   Vitamin D  deficiency disease 08/12/2019   Hallucinations, visual 03/09/2019   Moderate episode of recurrent major depressive disorder (HCC) 04/14/2018   Aortic insufficiency 03/19/2018   LVH (left ventricular hypertrophy) 03/19/2018   Grade I diastolic dysfunction 03/19/2018   Mitral regurgitation 03/19/2018   Lumbar canal stenosis 09/14/2017   Emphysema of lung (HCC) 05/31/2017   Palpitations 05/30/2017   Medication monitoring encounter 02/09/2016   Essential hypertension, benign 05/17/2015   Acute allergic reaction 05/17/2015   Situational stress 05/17/2015   Obesity 03/03/2015   Abnormal thyroid  blood test 03/03/2015   History of tobacco abuse 03/03/2015   Lumbago    Fibromyalgia    Anxiety 10/09/2012      Current Outpatient Medications:    cyclobenzaprine  (FLEXERIL ) 5 MG tablet, Take 1-2 tablets (5-10 mg total) by mouth 3 (three) times daily as needed for muscle spasms. Sedating, use sparingly do not drive or operate heavy machinery or take with other sedating medications, Disp: 60 tablet, Rfl: 1   donepezil (ARICEPT) 5 MG tablet, Take 1 tablet by mouth at bedtime., Disp: , Rfl:    DULoxetine  (CYMBALTA ) 30 MG capsule, Take 1 capsule (30 mg total) by mouth 2 (two) times daily., Disp: 180 capsule, Rfl: 1   fluticasone  (FLONASE ) 50 MCG/ACT nasal spray, Place 2 sprays into both nostrils daily., Disp: 16 g, Rfl: 6   gabapentin  (NEURONTIN ) 300 MG capsule, Take 1 capsule (300 mg total) by mouth 3 (three) times daily., Disp: 270 capsule, Rfl: 1   meloxicam  (MOBIC ) 7.5 MG tablet, Take 1 tablet (7.5 mg total) by mouth daily., Disp: 90 tablet, Rfl: 1   rOPINIRole  (REQUIP ) 0.25 MG tablet, TAKE 1 TABLET BY MOUTH AT BEDTIME, Disp: 90 tablet, Rfl: 2   No Known Allergies   Social  History   Tobacco Use   Smoking status: Former    Current packs/day: 0.00    Average packs/day: 1.5 packs/day for 34.0 years (51.0 ttl pk-yrs)    Types: Cigarettes    Start date: 13    Quit date: 2020    Years since quitting: 5.7   Smokeless tobacco: Never   Tobacco comments:    been given a Rx for Chantix  quit for 1 yr and started back  Vaping Use   Vaping status: Never Used  Substance Use Topics   Alcohol use: Yes    Comment: rare   Drug use: No      Chart Review Today: I personally reviewed active problem list, medication list, allergies, family history, social history, health maintenance, notes from last encounter, lab results,  imaging with the patient/caregiver today.   Review of Systems  Constitutional: Negative.   HENT: Negative.    Eyes: Negative.   Respiratory: Negative.    Cardiovascular: Negative.   Gastrointestinal: Negative.   Endocrine: Negative.   Genitourinary: Negative.   Musculoskeletal: Negative.   Skin: Negative.   Allergic/Immunologic: Negative.   Neurological: Negative.   Hematological: Negative.   Psychiatric/Behavioral: Negative.    All other systems reviewed and are negative.      Objective:   Vitals:   05/13/24 0920  BP: 130/68  Pulse: 88  Resp: 16  SpO2: 97%  Weight: 188 lb (85.3 kg)  Height: 5' 4 (1.626 m)    Body mass index is 32.27 kg/m.  Physical Exam Vitals and nursing note reviewed.  Constitutional:      General: She is not in acute distress.    Appearance: Normal appearance. She is well-developed. She is obese. She is not ill-appearing, toxic-appearing or diaphoretic.  HENT:     Head: Normocephalic and atraumatic.     Right Ear: External ear normal.     Left Ear: External ear normal.     Nose: Nose normal.  Eyes:     General: No scleral icterus.       Right eye: No discharge.        Left eye: No discharge.     Conjunctiva/sclera: Conjunctivae normal.  Neck:     Trachea: No tracheal deviation.   Cardiovascular:     Rate and Rhythm: Normal rate.  Pulmonary:     Effort: Pulmonary effort is normal. No respiratory distress.     Breath sounds: No stridor.  Skin:    General: Skin is warm and dry.     Findings: No rash.  Neurological:     Mental Status: She is alert.     Motor: No abnormal muscle tone.     Coordination: Coordination normal.     Gait: Gait normal.  Psychiatric:        Mood and Affect: Mood is anxious and depressed. Affect is tearful.        Speech: Speech normal.        Behavior: Behavior is hyperactive. Behavior is cooperative.        Thought Content: Thought content does not include homicidal or suicidal ideation. Thought content does not include homicidal or suicidal plan.        Cognition and Memory: Memory is impaired.      Results for orders placed or performed in visit on 05/01/24  Comprehensive metabolic panel with GFR   Collection Time: 05/01/24 10:45 AM  Result Value Ref Range   Glucose, Bld 79 65 - 99 mg/dL   BUN 20 7 - 25 mg/dL   Creat 9.14 9.49 - 8.94 mg/dL   eGFR 76 > OR = 60 fO/fpw/8.26f7   BUN/Creatinine Ratio SEE NOTE: 6 - 22 (calc)   Sodium 139 135 - 146 mmol/L   Potassium 4.3 3.5 - 5.3 mmol/L   Chloride 107 98 - 110 mmol/L   CO2 26 20 - 32 mmol/L   Calcium  9.2 8.6 - 10.4 mg/dL   Total Protein 6.6 6.1 - 8.1 g/dL   Albumin 4.1 3.6 - 5.1 g/dL   Globulin 2.5 1.9 - 3.7 g/dL (calc)   AG Ratio 1.6 1.0 - 2.5 (calc)   Total Bilirubin 0.4 0.2 - 1.2 mg/dL   Alkaline phosphatase (APISO) 55 37 - 153 U/L   AST 16 10 - 35 U/L   ALT 11  6 - 29 U/L  CBC with Differential/Platelet   Collection Time: 05/01/24 10:45 AM  Result Value Ref Range   WBC 6.9 3.8 - 10.8 Thousand/uL   RBC 4.03 3.80 - 5.10 Million/uL   Hemoglobin 12.3 11.7 - 15.5 g/dL   HCT 62.0 64.9 - 54.9 %   MCV 94.0 80.0 - 100.0 fL   MCH 30.5 27.0 - 33.0 pg   MCHC 32.5 32.0 - 36.0 g/dL   RDW 87.4 88.9 - 84.9 %   Platelets 316 140 - 400 Thousand/uL   MPV 10.8 7.5 - 12.5 fL   Neutro  Abs 4,119 1,500 - 7,800 cells/uL   Absolute Lymphocytes 2,139 850 - 3,900 cells/uL   Absolute Monocytes 476 200 - 950 cells/uL   Eosinophils Absolute 97 15 - 500 cells/uL   Basophils Absolute 69 0 - 200 cells/uL   Neutrophils Relative % 59.7 %   Total Lymphocyte 31.0 %   Monocytes Relative 6.9 %   Eosinophils Relative 1.4 %   Basophils Relative 1.0 %  Magnesium   Collection Time: 05/01/24 10:45 AM  Result Value Ref Range   Magnesium 2.0 1.5 - 2.5 mg/dL  VITAMIN D  25 Hydroxy (Vit-D Deficiency, Fractures)   Collection Time: 05/01/24 10:45 AM  Result Value Ref Range   Vit D, 25-Hydroxy 26 (L) 30 - 100 ng/mL  B12 and Folate Panel   Collection Time: 05/01/24 10:45 AM  Result Value Ref Range   Vitamin B-12 254 200 - 1,100 pg/mL   Folate 10.8 ng/mL  CK   Collection Time: 05/01/24 10:45 AM  Result Value Ref Range   Total CK 68 20 - 243 U/L  TEST AUTHORIZATION   Collection Time: 05/01/24 10:45 AM  Result Value Ref Range   TEST NAME: CREATINE KINASE, TOTAL    TEST CODE: [id number]    CLIENT CONTACT: LESLIE SMITH    REPORT ALWAYS MESSAGE SIGNATURE         Assessment & Plan:   1. Moderate episode of recurrent major depressive disorder (HCC) (Primary) Ensure she is taking cymbalta  BID, trial of adding buspar  and talking with therapist, LCS@ - busPIRone  (BUSPAR ) 5 MG tablet; Take 1-2 tablets (5-10 mg total) by mouth 2 (two) times daily as needed (anxiety stress).  Dispense: 180 tablet; Refill: 1 - AMB Referral VBCI Care Management  2. Anxiety See #1 - busPIRone  (BUSPAR ) 5 MG tablet; Take 1-2 tablets (5-10 mg total) by mouth 2 (two) times daily as needed (anxiety stress).  Dispense: 180 tablet; Refill: 1 - AMB Referral VBCI Care Management  3. Family history of problems related to stress See #1 and 2 - busPIRone  (BUSPAR ) 5 MG tablet; Take 1-2 tablets (5-10 mg total) by mouth 2 (two) times daily as needed (anxiety stress).  Dispense: 180 tablet; Refill: 1 - AMB Referral VBCI Care  Management  4. Myalgia Discussed previously, leg pain - see plan below  5. Neuropathy involving both lower extremities Encouraged to f/up with neuro, PM&R or spine surgery  On cymbalta , gabapentin , all labs checked recently  6. Spinal stenosis of lumbar region without neurogenic claudication F/up with current managing specialists  7. Loose stools Referred to gI previously, improvement with diet changes, but now sx return associated with stress/anxiety - see plan below   Assessment & Plan Depression with anxiety symptoms Experiencing significant emotional distress due to family issues, particularly concerning her grandchildren. Reports crying frequently and feeling overwhelmed, but denies full-blown panic attacks. Currently on duloxetine  (Cymbalta ) 30 mg twice daily, which she  reports taking consistently. Discussed potential benefit of adding buspirone  for anxiety symptoms, as it is non-sedating and can be used as needed. - Continue duloxetine  (Cymbalta ) 30 mg oral bid - Prescribe buspirone  for anxiety symptoms, to be taken as needed - Arrange for a Child psychotherapist to contact her for additional support  Fibromyalgia On duloxetine  (Cymbalta ) for fibromyalgia, which also helps with anxiety and depression. Emphasized the importance of consistent medication use to manage symptoms effectively. - Ensure consistent use of duloxetine  (Cymbalta ) 30 mg oral bid  Chronic low back pain and spinal stenosis of lumbar region with neuropathy involving both lower extremities Reports ongoing leg pain and a new sensation of aching in the lower back. Symptoms may be related to known spinal stenosis and neuropathy. Referral to a spine specialist or physical medicine and rehabilitation is considered necessary for further evaluation and management. - Refer to spine specialist or physical medicine and rehabilitation for further evaluation  Restless legs syndrome Previously used ropinirole  (Requip ) but  experienced significant leg pain in the morning. Recommended stopping Requip  and trying cyclobenzaprine  (Flexeril ) at night. - Continue cyclobenzaprine  (Flexeril ) at night  Irritable bowel syndrome with diarrhea Bowel symptoms are exacerbated by stress and are somewhat managed by following a FODMAP diet. Experiences episodes of diarrhea despite dietary management. Acknowledged the role of stress in exacerbating IBS symptoms and has previously referred her to a gastroenterologist. - Ensure referral to gastroenterology is active - Continue FODMAP diet  Recording duration: 18 minutes    GI consult previously done - sx intermittent,  a week ago they were completely resolved, now with increase stress anxiety much worse loose stool - may be IBS-D/somatic sx related to other factors continue to work on fodmap and other things that have helped GI sx F/up GI  Leg pain, low back pain - she is est with both Spine/PM&R her Md's info was given to her today on AVS - she does need to contact them for f/up on sx She still did not stop requip  and replace with muscle relaxers - still do that plan, labs reviewed and improved from prior acute OV with JUlie   Kemisha Bonnette, PA-C 05/13/24 9:32 AM

## 2024-05-17 DIAGNOSIS — R488 Other symbolic dysfunctions: Secondary | ICD-10-CM | POA: Diagnosis not present

## 2024-05-21 ENCOUNTER — Other Ambulatory Visit: Payer: Self-pay | Admitting: *Deleted

## 2024-05-22 NOTE — Patient Instructions (Signed)
 Visit Information  Thank you for taking time to visit with me today. Please don't hesitate to contact me if I can be of assistance to you before our next scheduled appointment.  Our next appointment is by telephone on 06/09/24 at 2pm Please call the care guide team at [phone] if you need to cancel or reschedule your appointment.   Following is a copy of your care plan:   Goals Addressed             This Visit's Progress    VBCI Social Work Care Plan       Problems:   Family and relationship dysfunction  CSW Clinical Goal(s):   Over the next 90 days the Patient will work with Geophysicist/field seismologist  to address needs related to safety concerns of family member  as evidenced by self report of  contacting the proper authorities to file an official report.  Interventions:  Mental Health:  Evaluation of current treatment plan related to family situation and related safety concerns Confirmed that personal safety plan is in place-has active restraining order Active listening / Reflection utilized Behavioral Activation reviewed Discussed referral options to connect for ongoing therapy: patient declined this option at this time but will consider in the future Emotional Support Provided Motivational Interviewing employed PHQ2 completed Solution-Focued Strategies employed: discussed reporting recommendations, confirmed that patient has the contact phone number to make report and patient is agreeable   Patient Goals/Self-Care Activities:  Call Department of Social Services [phone] to file a formal report.  Plan:   Telephone follow up appointment with care management team member scheduled for:  06/09/24        Please call the Suicide and Crisis Lifeline: 988 call the [place]  National Suicide Prevention Lifeline: 351-160-4019 or TTY: (647) 172-1562 TTY 4048714146) to talk to a trained counselor call 1-800-273-TALK (toll free, 24 hour hotline) call 911 if you are  experiencing a Mental Health or Behavioral Health Crisis or need someone to talk to.  Patient verbalizes understanding of instructions and care plan provided today and agrees to view in MyChart. Active MyChart status and patient understanding of how to access instructions and care plan via MyChart confirmed with patient.     Donnabelle Blanchard, LCSW [facility], [facility] Licensed Clinical Social Worker  Direct Dial: [phone]
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
## 2024-05-26 ENCOUNTER — Emergency Department
Admission: EM | Admit: 2024-05-26 | Discharge: 2024-05-26 | Disposition: A | Discharge status: Home / Self Care | Attending: Emergency Medicine | Admitting: Emergency Medicine

## 2024-05-26 ENCOUNTER — Other Ambulatory Visit: Payer: Self-pay

## 2024-05-26 ENCOUNTER — Emergency Department

## 2024-05-26 DIAGNOSIS — R488 Other symbolic dysfunctions: Secondary | ICD-10-CM | POA: Diagnosis not present

## 2024-05-26 DIAGNOSIS — R7402 Elevation of levels of lactic acid dehydrogenase (LDH): Secondary | ICD-10-CM | POA: Insufficient documentation

## 2024-05-26 DIAGNOSIS — R1084 Generalized abdominal pain: Secondary | ICD-10-CM

## 2024-05-26 DIAGNOSIS — E872 Acidosis, unspecified: Secondary | ICD-10-CM | POA: Diagnosis not present

## 2024-05-26 DIAGNOSIS — R109 Unspecified abdominal pain: Secondary | ICD-10-CM | POA: Diagnosis not present

## 2024-05-26 DIAGNOSIS — D72829 Elevated white blood cell count, unspecified: Secondary | ICD-10-CM | POA: Insufficient documentation

## 2024-05-26 DIAGNOSIS — K760 Fatty (change of) liver, not elsewhere classified: Secondary | ICD-10-CM | POA: Diagnosis not present

## 2024-05-26 DIAGNOSIS — R590 Localized enlarged lymph nodes: Secondary | ICD-10-CM | POA: Diagnosis not present

## 2024-05-26 DIAGNOSIS — I880 Nonspecific mesenteric lymphadenitis: Secondary | ICD-10-CM | POA: Insufficient documentation

## 2024-05-26 DIAGNOSIS — K588 Other irritable bowel syndrome: Secondary | ICD-10-CM | POA: Diagnosis not present

## 2024-05-26 LAB — COMPREHENSIVE METABOLIC PANEL WITH GFR
ALT: 13 U/L (ref 0–44)
AST: 25 U/L (ref 15–41)
Albumin: 4.2 g/dL (ref 3.5–5.0)
Alkaline Phosphatase: 52 U/L (ref 38–126)
Anion gap: 16 — ABNORMAL HIGH (ref 5–15)
BUN: 14 mg/dL (ref 8–23)
CO2: 15 mmol/L — ABNORMAL LOW (ref 22–32)
Calcium: 9.6 mg/dL (ref 8.9–10.3)
Chloride: 108 mmol/L (ref 98–111)
Creatinine, Ser: 0.96 mg/dL (ref 0.44–1.00)
GFR, Estimated: >60 mL/min (ref >60)
Glucose, Bld: 131 mg/dL — ABNORMAL HIGH (ref 70–99)
Potassium: 4.6 mmol/L (ref 3.5–5.1)
Sodium: 139 mmol/L (ref 135–145)
Total Bilirubin: 0.6 mg/dL (ref 0.0–1.2)
Total Protein: 7.3 g/dL (ref 6.5–8.1)

## 2024-05-26 LAB — BASIC METABOLIC PANEL WITH GFR
Anion gap: 6 (ref 5–15)
BUN: 15 mg/dL (ref 8–23)
CO2: 23 mmol/L (ref 22–32)
Calcium: 9.2 mg/dL (ref 8.9–10.3)
Chloride: 108 mmol/L (ref 98–111)
Creatinine, Ser: 0.78 mg/dL (ref 0.44–1.00)
GFR, Estimated: >60 mL/min (ref >60)
Glucose, Bld: 104 mg/dL — ABNORMAL HIGH (ref 70–99)
Potassium: 4.7 mmol/L (ref 3.5–5.1)
Sodium: 137 mmol/L (ref 135–145)

## 2024-05-26 LAB — URINALYSIS, ROUTINE W REFLEX MICROSCOPIC
Bilirubin Urine: NEGATIVE
Glucose, UA: NEGATIVE mg/dL
Hgb urine dipstick: NEGATIVE
Ketones, ur: NEGATIVE mg/dL
Leukocytes,Ua: NEGATIVE
Nitrite: NEGATIVE
Protein, ur: NEGATIVE mg/dL
Specific Gravity, Urine: 1.025 (ref 1.005–1.030)
pH: 5 (ref 5.0–8.0)

## 2024-05-26 LAB — LIPASE, BLOOD: Lipase: 22 U/L (ref 11–51)

## 2024-05-26 LAB — CBC WITH DIFFERENTIAL/PLATELET
Abs Immature Granulocytes: 0.05 K/uL (ref 0.00–0.07)
Basophils Absolute: 0 K/uL (ref 0.0–0.1)
Basophils Relative: 0 %
Eosinophils Absolute: 0.1 K/uL (ref 0.0–0.5)
Eosinophils Relative: 1 %
HCT: 43.4 % (ref 36.0–46.0)
Hemoglobin: 14 g/dL (ref 12.0–15.0)
Immature Granulocytes: 0 %
Lymphocytes Relative: 6 %
Lymphs Abs: 0.9 K/uL (ref 0.7–4.0)
MCH: 29.9 pg (ref 26.0–34.0)
MCHC: 32.3 g/dL (ref 30.0–36.0)
MCV: 92.7 fL (ref 80.0–100.0)
Monocytes Absolute: 1 K/uL (ref 0.1–1.0)
Monocytes Relative: 7 %
Neutro Abs: 12.6 K/uL — ABNORMAL HIGH (ref 1.7–7.7)
Neutrophils Relative %: 86 %
Platelets: 328 K/uL (ref 150–400)
RBC: 4.68 MIL/uL (ref 3.87–5.11)
RDW: 12.6 % (ref 11.5–15.5)
WBC: 14.6 K/uL — ABNORMAL HIGH (ref 4.0–10.5)
nRBC: 0 % (ref 0.0–0.2)

## 2024-05-26 LAB — LACTIC ACID, PLASMA
Lactic Acid, Venous: 2.1 mmol/L (ref 0.5–1.9)
Lactic Acid, Venous: 2 mmol/L (ref 0.5–1.9)

## 2024-05-26 MED ORDER — IOHEXOL 300 MG/ML  SOLN
100.0000 mL | Freq: Once | INTRAMUSCULAR | Status: AC | PRN
  Administered 2024-05-26: 100 mL via INTRAVENOUS

## 2024-05-26 MED ORDER — LACTATED RINGERS IV BOLUS
1000.0000 mL | Freq: Once | INTRAVENOUS | Status: AC
  Administered 2024-05-26: 1000 mL via INTRAVENOUS

## 2024-05-26 MED ORDER — LIDOCAINE VISCOUS HCL 2 % MT SOLN
15.0000 mL | Freq: Once | OROMUCOSAL | Status: AC
  Administered 2024-05-26: 15 mL via ORAL
  Filled 2024-05-26: qty 15

## 2024-05-26 MED ORDER — ALUM & MAG HYDROXIDE-SIMETH 200-200-20 MG/5ML PO SUSP
30.0000 mL | Freq: Once | ORAL | Status: AC
  Administered 2024-05-26: 30 mL via ORAL
  Filled 2024-05-26: qty 30

## 2024-05-26 MED ORDER — METRONIDAZOLE 500 MG PO TABS: 500.0000 mg | ORAL_TABLET | Freq: Two times a day (BID) | ORAL | 0 refills | Status: DC

## 2024-05-26 MED ORDER — CIPROFLOXACIN HCL 500 MG PO TABS: 500.0000 mg | ORAL_TABLET | Freq: Two times a day (BID) | ORAL | 0 refills | Status: DC

## 2024-05-26 MED ORDER — ONDANSETRON HCL 4 MG/2ML IJ SOLN
4.0000 mg | Freq: Once | INTRAMUSCULAR | Status: AC
  Administered 2024-05-26: 4 mg via INTRAVENOUS
  Filled 2024-05-26: qty 2

## 2024-05-26 NOTE — ED Provider Notes (Signed)
 [facility] Provider Note    Event Date/Time   First MD Initiated Contact with Patient 05/26/24 0231     (approximate)   History   Chief Complaint Abdominal Pain   HPI  Jenna Meyers is a 68 y.o. female with past medical history of hyperlipidemia, fibromyalgia, and LVH who presents to the ED complaining of abdominal pain.  Patient reports that she has been dealing with increasing pain across her entire abdomen for about the past 2 months.  She describes the pain as crampy and not exacerbated or alleviated by anything.  She has been feeling nauseous at times and reports significant diarrhea, but has not had any vomiting or blood in her stool.  She denies any fevers, flank pain, dysuria, or hematuria.  She states she has dealt with IBS for many years and current symptoms feel similar.     Physical Exam   Triage Vital Signs: ED Triage Vitals  Encounter Vitals Group     BP 05/26/24 0226 120/79     Girls Systolic BP Percentile --      Girls Diastolic BP Percentile --      Boys Systolic BP Percentile --      Boys Diastolic BP Percentile --      Pulse Rate 05/26/24 0226 (!) 104     Resp 05/26/24 0226 19     Temp 05/26/24 0226 98.4 F (36.9 C)     Temp Source 05/26/24 0226 Oral     SpO2 05/26/24 0226 100 %     Weight 05/26/24 0228 185 lb 4.8 oz (84.1 kg)     Height 05/26/24 0228 5' 4 (1.626 m)     Head Circumference --      Peak Flow --      Pain Score 05/26/24 0227 5     Pain Loc --      Pain Education --      Exclude from Growth Chart --     Most recent vital signs: Vitals:   05/26/24 0226 05/26/24 0640  BP: 120/79   Pulse: (!) 104   Resp: 19   Temp: 98.4 F (36.9 C) 98.2 F (36.8 C)  SpO2: 100%     Constitutional: Alert and oriented. Eyes: Conjunctivae are normal. Head: Atraumatic. Nose: No congestion/rhinnorhea. Mouth/Throat: Mucous membranes are moist.  Cardiovascular: Normal rate, regular rhythm. Grossly normal heart sounds.   2+ radial pulses bilaterally. Respiratory: Normal respiratory effort.  No retractions. Lungs CTAB. Gastrointestinal: Soft and diffusely tender to palpation, greatest on the right side with no rebound or guarding.  No CVA tenderness bilaterally. No distention. Musculoskeletal: No lower extremity tenderness nor edema.  Neurologic:  Normal speech and language. No gross focal neurologic deficits are appreciated.    ED Results / Procedures / Treatments   Labs (all labs ordered are listed, but only abnormal results are displayed) Labs Reviewed  COMPREHENSIVE METABOLIC PANEL WITH GFR - Abnormal; Notable for the following components:      Result Value   CO2 15 (*)    Glucose, Bld 131 (*)    Anion gap 16 (*)    All other components within normal limits  URINALYSIS, ROUTINE W REFLEX MICROSCOPIC - Abnormal; Notable for the following components:   Color, Urine YELLOW (*)    APPearance HAZY (*)    All other components within normal limits  CBC WITH DIFFERENTIAL/PLATELET - Abnormal; Notable for the following components:   WBC 14.6 (*)    Neutro Abs 12.6 (*)  All other components within normal limits  BASIC METABOLIC PANEL WITH GFR - Abnormal; Notable for the following components:   Glucose, Bld 104 (*)    All other components within normal limits  LACTIC ACID, PLASMA - Abnormal; Notable for the following components:   Lactic Acid, Venous 2.1 (*)    All other components within normal limits  LACTIC ACID, PLASMA - Abnormal; Notable for the following components:   Lactic Acid, Venous 2.0 (*)    All other components within normal limits  LIPASE, BLOOD    RADIOLOGY CT abdomen/pelvis reviewed and interpreted by me with mesenteric inflammatory changes, no focal fluid collections or dilated bowel loops noted.  PROCEDURES:  Critical Care performed: No  Procedures   MEDICATIONS ORDERED IN ED: Medications  ondansetron (ZOFRAN) injection 4 mg (4 mg Intravenous Given 05/26/24 0330)   lactated ringers bolus 1,000 mL (0 mLs Intravenous Stopped 05/26/24 0610)  iohexol (OMNIPAQUE) 300 MG/ML solution 100 mL (100 mLs Intravenous Contrast Given 05/26/24 0337)  lactated ringers bolus 1,000 mL (0 mLs Intravenous Stopped 05/26/24 0639)  alum & mag hydroxide-simeth (MAALOX/MYLANTA) 200-200-20 MG/5ML suspension 30 mL (30 mLs Oral Given 05/26/24 0527)    And  lidocaine  (XYLOCAINE ) 2 % viscous mouth solution 15 mL (15 mLs Oral Given 05/26/24 0527)     IMPRESSION / MDM / ASSESSMENT AND PLAN / ED COURSE  I reviewed the triage vital signs and the nursing notes.                              68 y.o. female with past medical history of hyperlipidemia, fibromyalgia, and LVH who presents to the ED complaining of 2 months of increasing abdominal pain associated with diarrhea.  Patient's presentation is most consistent with acute presentation with potential threat to life or bodily function.  Differential diagnosis includes, but is not limited to, appendicitis, diverticulitis, colitis, cholecystitis, biliary colic, hepatitis, pancreatitis, anemia, Opyd abnormality, AKI, kidney stone, UTI.  Patient nontoxic-appearing and in no acute distress, vital signs are unremarkable.  Patient's abdomen is soft but diffusely tender to palpation, greatest on the right side.  Labs show leukocytosis but no significant anemia or AKI.  Labs also show acidosis with bicarb of 15 and mild increase in anion gap at 16 with no associated hyperglycemia to suggest DKA.  Will give IV fluid bolus and IV Zofran, repeat BMP following hydration.  CT imaging with diffuse wispy inflammatory changes consistent with mesenteric adenitis per radiology, no other acute finding noted.  Repeat BMP with resolution of acidosis, lactic acid mildly elevated but improving on recheck, no concerns for bowel ischemia at this time.  Patient reports significant relief in symptoms following IV Zofran and fluids, is tolerating oral intake without  difficulty currently.  Patient appropriate for outpatient management, will prescribe course of antibiotics for mesenteric adenitis.  She was counseled to follow-up with her PCP and to return to the ED for new or worsening symptoms, patient agrees with plan.      FINAL CLINICAL IMPRESSION(S) / ED DIAGNOSES   Final diagnoses:  Mesenteric adenitis  Generalized abdominal pain     Rx / DC Orders   ED Discharge Orders          Ordered    ciprofloxacin (CIPRO) 500 MG tablet  2 times daily        05/26/24 0654    metroNIDAZOLE (FLAGYL) 500 MG tablet  2 times daily  05/26/24 0654             Note:  This document was prepared using Dragon voice recognition software and may include unintentional dictation errors.   Willo Dunnings, MD 05/26/24 [phone]

## 2024-05-26 NOTE — ED Triage Notes (Signed)
 Pt arrived from home via [facility] d/t abdominal pain. Pt states that the pain has been going on for several days. Pt has a hx of IBS. Pt states that she is utilizing FODMAP diet for IBS but states [facility] the [facility] doesn't always match what the diet is supposed to be.

## 2024-05-27 DIAGNOSIS — R488 Other symbolic dysfunctions: Secondary | ICD-10-CM | POA: Diagnosis not present

## 2024-05-28 ENCOUNTER — Ambulatory Visit: Payer: Self-pay

## 2024-05-28 DIAGNOSIS — R488 Other symbolic dysfunctions: Secondary | ICD-10-CM | POA: Diagnosis not present

## 2024-05-28 NOTE — Telephone Encounter (Signed)
 FYI Only or Action Required?: FYI only for provider.  Patient was last seen in primary care on 05/13/2024 by Leavy Mole, PA-C.  Called Nurse Triage reporting Diarrhea.  Symptoms began a week ago.  Interventions attempted: Rest, hydration, or home remedies.  Symptoms are: unchanged.  Triage Disposition: See Physician Within 24 Hours  Patient/caregiver understands and will follow disposition?:   Copied from CRM #[id number]. Topic: Clinical - Red Word Triage >> May 28, 2024  4:55 PM Jasmin G wrote: Red Word that prompted transfer to Nurse Triage: Pt states that she got discharged from the hospital yesterday due to experiencing severe IBS and dehydration, pt states that she is still not having any firm stools and feels really weak throughout the day, she's trying to drink as much liquids as she can to help. Reason for Disposition  [1] MODERATE diarrhea (e.g., 4-6 times / day more than normal) AND [2] present > 48 hours (2 days)  Answer Assessment - Initial Assessment Questions Seen at ED yesterday for dehydration and severe IBS. Pt states all day today she has had liquid stools. Pt orally hydrating. Denies all other sxs  1. DIARRHEA SEVERITY: How bad is the diarrhea? How many more stools have you had in the past 24 hours than normal?      moderate 2. ONSET: When did the diarrhea begin?      A couple weeks 3. STOOL DESCRIPTION:  How loose or watery is the diarrhea? What is the stool color? Is there any blood or mucous in the stool?     Liquid. brown 4. VOMITING: Are you also vomiting? If Yes, ask: How many times in the past 24 hours?      denies 5. ABDOMEN PAIN: Are you having any abdomen pain? If Yes, ask: What does it feel like? (e.g., crampy, dull, intermittent, constant)      Periumbilical pain 6. ABDOMEN PAIN SEVERITY: If present, ask: How bad is the pain?  (e.g., Scale 1-10; mild, moderate, or severe)     6/10 7. ORAL INTAKE: If vomiting, Have you been able  to drink liquids? How much liquids have you had in the past 24 hours?     Yes.  8. HYDRATION: Any signs of dehydration? (e.g., dry mouth [not just dry lips], too weak to stand, dizziness, new weight loss) When did you last urinate?     Yes but was seen in ER, feels better currently 9. EXPOSURE: Have you traveled to a foreign country recently? Have you been exposed to anyone with diarrhea? Could you have eaten any food that was spoiled?     denies 10. ANTIBIOTIC USE: Are you taking antibiotics now or have you taken antibiotics in the past 2 months?       denies 11. OTHER SYMPTOMS: Do you have any other symptoms? (e.g., fever, blood in stool)       denies  Protocols used: Diarrhea-A-AH

## 2024-05-29 NOTE — Progress Notes (Signed)
 This encounter was created in error - please disregard.

## 2024-05-30 ENCOUNTER — Encounter: Payer: Self-pay | Admitting: Nurse Practitioner

## 2024-05-30 ENCOUNTER — Ambulatory Visit (INDEPENDENT_AMBULATORY_CARE_PROVIDER_SITE_OTHER): Admitting: Nurse Practitioner

## 2024-05-30 VITALS — BP 126/82 | HR 86 | Resp 16 | Ht 64.0 in | Wt 188.0 lb

## 2024-05-30 DIAGNOSIS — I880 Nonspecific mesenteric lymphadenitis: Secondary | ICD-10-CM

## 2024-05-30 DIAGNOSIS — R197 Diarrhea, unspecified: Secondary | ICD-10-CM | POA: Diagnosis not present

## 2024-05-30 DIAGNOSIS — K580 Irritable bowel syndrome with diarrhea: Secondary | ICD-10-CM

## 2024-05-30 MED ORDER — RIFAXIMIN 550 MG PO TABS: 550.0000 mg | ORAL_TABLET | Freq: Three times a day (TID) | ORAL | 0 refills | Status: AC

## 2024-05-30 NOTE — Progress Notes (Signed)
 BP 126/82   Pulse 86   Resp 16   Ht 5' 4 (1.626 m)   Wt 188 lb (85.3 kg)   SpO2 99%   BMI 32.27 kg/m    Subjective:    Patient ID: Jenna Meyers, female    DOB: 27-Sep-1955, 68 y.o.   MRN: [id number]  HPI: Jenna Meyers is a 68 y.o. female  Chief Complaint  Patient presents with   Diarrhea    X2 weeks. Caused dehydration, but no vomiting   Discussed the use of AI scribe software for clinical note transcription with the patient, who gave verbal consent to proceed.  History of Present Illness Jenna Meyers is a 68 year old female with irritable bowel syndrome who presents with diarrhea for two weeks.  Diarrhea and dehydration - Diarrhea for two weeks, leading to dehydration - Emergency department visit on May 26, 2024 for dehydration - Treated with intravenous fluids in the emergency department - No diarrhea in the last several days - Symptoms have improved since the emergency department visit, though she has been eating less  Laboratory and imaging findings - Elevated white blood cell count of 14.6 - Normal lipase and comprehensive metabolic panel - Lactic acid initially elevated at 2.1, decreased to 2.0 after fluid administration - CT abdomen and pelvis showed findings in the small bowel mesentery and mesenteric lymph nodes  Antibiotic therapy - Discharged from emergency department with prescriptions for ciprofloxacin and metronidazole - Did not receive or take prescribed antibiotics - Not currently taking any antibiotics  Irritable bowel syndrome exacerbation - History of irritable bowel syndrome with recent exacerbation - Attempted FODMAP diet without symptom improvement - Previous prescription for IBS medication by a provider named Olam, unable to recall the specific medication  Dietary management challenges - [place] in a retirement community with challenging food options for her condition - Seeking a more effective dietary management strategy as  FODMAP diet has not been helpful    CT ABDOMEN AND PELVIS WITH CONTRAST 05/26/2024 03:45:24 AM   TECHNIQUE: CT of the abdomen and pelvis was performed with the administration of intravenous contrast. Multiplanar reformatted images are provided for review. Automated exposure control, iterative reconstruction, and/or weight-based adjustment of the mA/kV was utilized to reduce the radiation dose to as low as reasonably achievable.   COMPARISON: Comparison is made to CT chest dated June 17, 2020.   CLINICAL HISTORY: Abdominal pain, acute, nonlocalized. Patient arrived from home via [facility] due to abdominal pain. Patient states that the pain has been going on for several days. Patient has a history of IBS. Patient states that she is utilizing FODMAP diet for IBS but states the food at the retirement community doesn't always match; what the diet is supposed to be.   FINDINGS:   LOWER CHEST: No acute abnormality.   LIVER: Mild hepatic steatosis. The liver is otherwise unremarkable.   GALLBLADDER AND BILE DUCTS: Gallbladder is unremarkable. No biliary ductal dilatation.   SPLEEN: No acute abnormality.   PANCREAS: No acute abnormality.   ADRENAL GLANDS: No acute abnormality.   KIDNEYS, URETERS AND BLADDER: No stones in the kidneys or ureters. No hydronephrosis. No perinephric or periureteral stranding. Urinary bladder is unremarkable. Status post hysterectomy. No adnexal masses.   GI AND BOWEL: There is progressive wispy mesenteric infiltration throughout the small bowel mesentery with associated shotty mesenteric adenopathy suggesting an underlying inflammatory process such as sclerosing mesenteritis or mesenteric adenitis. The stomach, small bowel, and large bowel are otherwise unremarkable. Appendix is normal.  No free intraperitoneal gas or fluid. Rectal findings are unremarkable.   PERITONEUM AND RETROPERITONEUM: No ascites. No free air.   VASCULATURE: Mild  aortoiliac atherosclerotic calcification.   LYMPH NODES: No lymphadenopathy except for shotty mesenteric adenopathy.   REPRODUCTIVE ORGANS: Status post hysterectomy. No adnexal masses.   BONES AND SOFT TISSUES: Osseous structures are age appropriate. No acute bone abnormality. No lytic or blastic bone lesion.   IMPRESSION: 1. Progressive wispy mesenteric infiltration throughout the small bowel mesentery with associated shotty mesenteric adenopathy, suggesting an underlying inflammatory process such as sclerosing mesenteritis or mesenteric adenitis. No mass-like consolidation, cicatricial change, or vascular impingement. 2. Mild hepatic steatosis.       05/21/2024    3:47 PM 05/13/2024    9:25 AM 02/27/2024   10:15 AM  Depression screen PHQ 2/9  Decreased Interest 0 2 0  Down, Depressed, Hopeless 1 3 0  PHQ - 2 Score 1 5 0  Altered sleeping  0 3  Tired, decreased energy  0 0  Change in appetite  0 0  Feeling bad or failure about yourself   0 0  Trouble concentrating  0 0  Moving slowly or fidgety/restless  0 0  Suicidal thoughts  0 0  PHQ-9 Score  5 3    Relevant past medical, surgical, family and social history reviewed and updated as indicated. Interim medical history since our last visit reviewed. Allergies and medications reviewed and updated.  Review of Systems  Ten systems reviewed and is negative except as mentioned in HPI      Objective:      BP 126/82   Pulse 86   Resp 16   Ht 5' 4 (1.626 m)   Wt 188 lb (85.3 kg)   SpO2 99%   BMI 32.27 kg/m    Wt Readings from Last 3 Encounters:  05/30/24 188 lb (85.3 kg)  05/26/24 185 lb 4.8 oz (84.1 kg)  05/13/24 188 lb (85.3 kg)    Physical Exam GENERAL: Alert, cooperative, well developed, no acute distress HEENT: Normocephalic, normal oropharynx, moist mucous membranes CHEST: Clear to auscultation bilaterally, No wheezes, rhonchi, or crackles CARDIOVASCULAR: Normal heart rate and rhythm, S1 and S2 normal  without murmurs ABDOMEN: Soft, non-tender, non-distended, without organomegaly, Normal bowel sounds EXTREMITIES: No cyanosis or edema NEUROLOGICAL: Cranial nerves grossly intact, Moves all extremities without gross motor or sensory deficit  Results for orders placed or performed during the hospital encounter of 05/26/24  Lipase, blood   Collection Time: 05/26/24  2:32 AM  Result Value Ref Range   Lipase 22 11 - 51 U/L  Comprehensive metabolic panel   Collection Time: 05/26/24  2:32 AM  Result Value Ref Range   Sodium 139 135 - 145 mmol/L   Potassium 4.6 3.5 - 5.1 mmol/L   Chloride 108 98 - 111 mmol/L   CO2 15 (L) 22 - 32 mmol/L   Glucose, Bld 131 (H) 70 - 99 mg/dL   BUN 14 8 - 23 mg/dL   Creatinine, Ser 9.03 0.44 - 1.00 mg/dL   Calcium  9.6 8.9 - 10.3 mg/dL   Total Protein 7.3 6.5 - 8.1 g/dL   Albumin 4.2 3.5 - 5.0 g/dL   AST 25 15 - 41 U/L   ALT 13 0 - 44 U/L   Alkaline Phosphatase 52 38 - 126 U/L   Total Bilirubin 0.6 0.0 - 1.2 mg/dL   GFR, Estimated >39 >39 mL/min   Anion gap 16 (H) 5 - 15  CBC with  Differential   Collection Time: 05/26/24  2:32 AM  Result Value Ref Range   WBC 14.6 (H) 4.0 - 10.5 K/uL   RBC 4.68 3.87 - 5.11 MIL/uL   Hemoglobin 14.0 12.0 - 15.0 g/dL   HCT 56.5 63.9 - 53.9 %   MCV 92.7 80.0 - 100.0 fL   MCH 29.9 26.0 - 34.0 pg   MCHC 32.3 30.0 - 36.0 g/dL   RDW 87.3 88.4 - 84.4 %   Platelets 328 150 - 400 K/uL   nRBC 0.0 0.0 - 0.2 %   Neutrophils Relative % 86 %   Neutro Abs 12.6 (H) 1.7 - 7.7 K/uL   Lymphocytes Relative 6 %   Lymphs Abs 0.9 0.7 - 4.0 K/uL   Monocytes Relative 7 %   Monocytes Absolute 1.0 0.1 - 1.0 K/uL   Eosinophils Relative 1 %   Eosinophils Absolute 0.1 0.0 - 0.5 K/uL   Basophils Relative 0 %   Basophils Absolute 0.0 0.0 - 0.1 K/uL   Immature Granulocytes 0 %   Abs Immature Granulocytes 0.05 0.00 - 0.07 K/uL  Urinalysis, Routine w reflex microscopic -Urine, Clean Catch   Collection Time: 05/26/24  4:30 AM  Result Value Ref  Range   Color, Urine YELLOW (A) YELLOW   APPearance HAZY (A) CLEAR   Specific Gravity, Urine 1.025 1.005 - 1.030   pH 5.0 5.0 - 8.0   Glucose, UA NEGATIVE NEGATIVE mg/dL   Hgb urine dipstick NEGATIVE NEGATIVE   Bilirubin Urine NEGATIVE NEGATIVE   Ketones, ur NEGATIVE NEGATIVE mg/dL   Protein, ur NEGATIVE NEGATIVE mg/dL   Nitrite NEGATIVE NEGATIVE   Leukocytes,Ua NEGATIVE NEGATIVE  Basic metabolic panel   Collection Time: 05/26/24  4:30 AM  Result Value Ref Range   Sodium 137 135 - 145 mmol/L   Potassium 4.7 3.5 - 5.1 mmol/L   Chloride 108 98 - 111 mmol/L   CO2 23 22 - 32 mmol/L   Glucose, Bld 104 (H) 70 - 99 mg/dL   BUN 15 8 - 23 mg/dL   Creatinine, Ser 9.21 0.44 - 1.00 mg/dL   Calcium  9.2 8.9 - 10.3 mg/dL   GFR, Estimated >39 >39 mL/min   Anion gap 6 5 - 15  Lactic acid, plasma   Collection Time: 05/26/24  4:30 AM  Result Value Ref Range   Lactic Acid, Venous 2.1 (HH) 0.5 - 1.9 mmol/L  Lactic acid, plasma   Collection Time: 05/26/24  6:18 AM  Result Value Ref Range   Lactic Acid, Venous 2.0 (HH) 0.5 - 1.9 mmol/L          Assessment & Plan:   Problem List Items Addressed This Visit   None Visit Diagnoses       Diarrhea, unspecified type    -  Primary   Relevant Medications   rifaximin (XIFAXAN) 550 MG TABS tablet     Mesenteric adenitis         Irritable bowel syndrome with diarrhea       Relevant Medications   rifaximin (XIFAXAN) 550 MG TABS tablet        Assessment and Plan Assessment & Plan Irritable bowel syndrome with acute exacerbation of diarrhea Recent exacerbation of IBS led to acute diarrhea and dehydration, requiring emergency department treatment. CT scan indicated progressive wispy mesenteric infiltration and shoddy mesenteric adenopathy, suggesting an inflammatory process such as sclerosing mesenteritis or mesenteric adenitis. Symptoms have improved since the ER visit. FODMAP diet has been ineffective. - Prescribe Xifaxan for two  weeks for IBS  with diarrhea. - Advise to contact if Xifaxan is too expensive, and Imodium  will be prescribed instead with er antibiotics which I will resend if she does not take xifaxan         Follow up plan: Return if symptoms worsen or fail to improve.

## 2024-06-03 DIAGNOSIS — R488 Other symbolic dysfunctions: Secondary | ICD-10-CM | POA: Diagnosis not present

## 2024-06-05 DIAGNOSIS — R488 Other symbolic dysfunctions: Secondary | ICD-10-CM | POA: Diagnosis not present

## 2024-06-09 ENCOUNTER — Telehealth: Payer: Self-pay | Admitting: *Deleted

## 2024-06-09 ENCOUNTER — Encounter: Payer: Self-pay | Admitting: *Deleted

## 2024-06-10 DIAGNOSIS — R488 Other symbolic dysfunctions: Secondary | ICD-10-CM | POA: Diagnosis not present

## 2024-06-12 DIAGNOSIS — R488 Other symbolic dysfunctions: Secondary | ICD-10-CM | POA: Diagnosis not present

## 2024-06-16 DIAGNOSIS — R488 Other symbolic dysfunctions: Secondary | ICD-10-CM | POA: Diagnosis not present

## 2024-06-17 DIAGNOSIS — R488 Other symbolic dysfunctions: Secondary | ICD-10-CM | POA: Diagnosis not present

## 2024-06-20 ENCOUNTER — Other Ambulatory Visit: Payer: Self-pay | Admitting: Family Medicine

## 2024-06-20 DIAGNOSIS — M25511 Pain in right shoulder: Secondary | ICD-10-CM

## 2024-06-21 NOTE — Telephone Encounter (Signed)
 Requested Prescriptions  Pending Prescriptions Disp Refills   gabapentin  (NEURONTIN ) 300 MG capsule [Pharmacy Med Name: GABAPENTIN  300 MG CAPSULE] 270 capsule 0    Sig: TAKE 1 CAPSULE BY MOUTH 3 TIMES A DAY     Neurology: Anticonvulsants - gabapentin  Passed - 06/21/2024  9:22 AM      Passed - Cr in normal range and within 360 days    Creat  Date Value Ref Range Status  05/01/2024 0.85 0.50 - 1.05 mg/dL Final   Creatinine, Ser  Date Value Ref Range Status  05/26/2024 0.78 0.44 - 1.00 mg/dL Final         Passed - Completed PHQ-2 or PHQ-9 in the last 360 days      Passed - Valid encounter within last 12 months    Recent Outpatient Visits           3 weeks ago Diarrhea, unspecified type   [facility] Gareth Clarity F, FNP   1 month ago Moderate episode of recurrent major depressive disorder [facility])   [facility] Leavy Mole, PA-C   1 month ago Myalgia   [facility] Leavy Mole, PA-C   3 months ago Medicare annual wellness visit, subsequent   [facility] Leavy Mole, PA-C   4 months ago Leg cramps   [facility] Gareth Clarity [place], [place]

## 2024-06-23 DIAGNOSIS — R488 Other symbolic dysfunctions: Secondary | ICD-10-CM | POA: Diagnosis not present

## 2024-06-30 ENCOUNTER — Other Ambulatory Visit: Payer: Self-pay | Admitting: *Deleted

## 2024-06-30 DIAGNOSIS — R488 Other symbolic dysfunctions: Secondary | ICD-10-CM | POA: Diagnosis not present

## 2024-07-01 ENCOUNTER — Other Ambulatory Visit: Payer: Self-pay | Admitting: Family Medicine

## 2024-07-01 DIAGNOSIS — G5793 Unspecified mononeuropathy of bilateral lower limbs: Secondary | ICD-10-CM

## 2024-07-01 DIAGNOSIS — M48061 Spinal stenosis, lumbar region without neurogenic claudication: Secondary | ICD-10-CM

## 2024-07-01 DIAGNOSIS — M797 Fibromyalgia: Secondary | ICD-10-CM

## 2024-07-01 DIAGNOSIS — G8929 Other chronic pain: Secondary | ICD-10-CM

## 2024-07-01 DIAGNOSIS — M791 Myalgia, unspecified site: Secondary | ICD-10-CM

## 2024-07-01 NOTE — Patient Instructions (Signed)
 Visit Information  Thank you for taking time to visit with me today. Please don't hesitate to contact me if I can be of assistance to you before our next scheduled appointment.  Your next care management appointment is no further scheduled appointments.    Closing From: Complex Care Management.  Please call the care guide team at [phone] if you need to cancel, schedule, or reschedule an appointment.   Please call the Suicide and Crisis Lifeline: 988 call the [place]  National Suicide Prevention Lifeline: 408-225-0077 or TTY: 573-226-6272 TTY (226)704-1283) to talk to a trained counselor call 1-800-273-TALK (toll free, 24 hour hotline) call 911 if you are experiencing a Mental Health or Behavioral Health Crisis or need someone to talk to.  Bindi Klomp, LCSW [place]  [facility], [facility] Licensed Clinical Social Worker  Direct Dial: [phone]

## 2024-07-01 NOTE — Patient Outreach (Signed)
 Complex Care Management   Visit Note  07/01/2024  Name:  Jenna Meyers MRN: [id number] DOB: 1955-09-21  Situation: Referral received for Complex Care Management related to Mental/Behavioral Health diagnosis depression, family conflicts. I obtained verbal consent from Patient.  Visit completed with Patient  on the phone on 05/30/24.  Background:   Past Medical History:  Diagnosis Date   Anxiety 10/09/2012   Aortic insufficiency 03/19/2018   ECHO July 2019   Arthritis    back   Depression    Emphysema lung (HCC)    Emphysema of lung (HCC)    Fibromyalgia    Grade I diastolic dysfunction 03/19/2018   ECHO July 2019   Hyperlipidemia    Hypertension    IBS (irritable bowel syndrome)    Loss of consciousness (HCC) 03/09/2019   Lumbago    Lumbar canal stenosis 09/14/2017   Moderate to severe, MRI Nov 2018   LVH (left ventricular hypertrophy) 03/19/2018   ECHO July 2019   Mitral regurgitation 03/19/2018   ECHO July 2019   Tobacco abuse    Vitamin D  deficiency disease     Assessment: Patient Reported Symptoms:  Cognitive Cognitive Status: Alert and oriented to person, place, and time, Insightful and able to interpret abstract concepts, Normal speech and language skills Other: per patient mild dementia   Health Maintenance Behaviors: Annual physical exam Healing Pattern: Average Health Facilitated by: Stress management, Pain control  Neurological   Neurological Comment: remains active with Neurology-Dr. Loreli no changes-followed by speech therapy 2x per week, comes to the facility where he lives  HEENT HEENT Symptoms Reported: No symptoms reported      Cardiovascular Cardiovascular Symptoms Reported: No symptoms reported    Respiratory Respiratory Symptoms Reported: No symptoms reported    Endocrine Endocrine Symptoms Reported: No symptoms reported    Gastrointestinal Gastrointestinal Symptoms Reported: No symptoms reported Additional Gastrointestinal Details: no problems  with IBS at all Gastr on 07/09/24 Gastrointestinal Management Strategies: Diet modification    Genitourinary Genitourinary Symptoms Reported: No symptoms reported    Integumentary Integumentary Symptoms Reported: No symptoms reported    Musculoskeletal Musculoskelatal Symptoms Reviewed: No symptoms reported        Psychosocial Psychosocial Symptoms Reported: No symptoms reported Additional Psychological Details: patient confirms that her grandchildren are no longer a safety risk-son no longer involved patient encouraged to contact child protective services in the event any safety concerns arise in the future   Behaviors When Feeling Stressed/Fearful: maintains restraining order against son, remains active in activities at the independent living where she resides Techniques to Marty with Loss/Stress/Change: Diversional activities      07/01/2024    PHQ2-9 Depression Screening   Little interest or pleasure in doing things Not at all  Feeling down, depressed, or hopeless Several days  PHQ-2 - Total Score 1  Trouble falling or staying asleep, or sleeping too much    Feeling tired or having little energy    Poor appetite or overeating     Feeling bad about yourself - or that you are a failure or have let yourself or your family down    Trouble concentrating on things, such as reading the newspaper or watching television    Moving or speaking so slowly that other people could have noticed.  Or the opposite - being so fidgety or restless that you have been moving around a lot more than usual    Thoughts that you would be better off dead, or hurting yourself in some way  PHQ2-9 Total Score    If you checked off any problems, how difficult have these problems made it for you to do your work, take care of things at home, or get along with other people    Depression Interventions/Treatment      There were no vitals filed for this visit.    Medications Reviewed Today     Reviewed by  Ermalinda Lenn HERO, LCSW (Social Worker) on 06/30/24 at 1422  Med List Status: <None>   Medication Order Taking? Sig Documenting Provider Last Dose Status Informant  busPIRone  (BUSPAR ) 5 MG tablet [id number] Yes Take 1-2 tablets (5-10 mg total) by mouth 2 (two) times daily as needed (anxiety stress). Tapia, [name], PA-C  Active   cyclobenzaprine  (FLEXERIL ) 5 MG tablet [id number] Yes Take 1-2 tablets (5-10 mg total) by mouth 3 (three) times daily as needed for muscle spasms. Sedating, use sparingly do not drive or operate heavy machinery or take with other sedating medications Tapia, [name], PA-C  Active   donepezil (ARICEPT) 5 MG tablet [id number] Yes Take 1 tablet by mouth at bedtime. [provider]  Active   DULoxetine  (CYMBALTA ) 30 MG capsule [id number] Yes Take 1 capsule (30 mg total) by mouth 2 (two) times daily. Tapia, [name], PA-C  Active   fluticasone  (FLONASE ) 50 MCG/ACT nasal spray [id number] Yes Place 2 sprays into both nostrils daily. Tapia, [name], PA-C  Active   gabapentin  (NEURONTIN ) 300 MG capsule [id number] Yes TAKE 1 CAPSULE BY MOUTH 3 TIMES A DAY Tapia, [name], PA-C  Active   meloxicam  (MOBIC ) 7.5 MG tablet [id number] Yes Take 1 tablet (7.5 mg total) by mouth daily. Tapia, [name], PA-C  Active   rOPINIRole  (REQUIP ) 0.25 MG tablet [id number] Yes TAKE 1 TABLET BY MOUTH AT BEDTIME Pender, Julie F, FNP  Active   Med List Note Octavia Chrissie MATSU, RN 07/22/15 1439): UDS 07/22/2015            Recommendation:   PCP Follow-up  Follow Up Plan:   Closing From:  Complex Care Management  Oona Trammel, LCSW   [facility], [facility] Licensed Clinical Social Worker  Direct Dial: [phone]

## 2024-07-03 DIAGNOSIS — R488 Other symbolic dysfunctions: Secondary | ICD-10-CM | POA: Diagnosis not present

## 2024-07-03 NOTE — Telephone Encounter (Signed)
 Requested medication (s) are due for refill today: Yes  Requested medication (s) are on the active medication list: Yes  Last refill:  05/03/24  Future visit scheduled: Yes  Notes to clinic:  Not delegated.    Requested Prescriptions  Pending Prescriptions Disp Refills   cyclobenzaprine  (FLEXERIL ) 5 MG tablet [Pharmacy Med Name: CYCLOBENZAPRINE  HCL 5 MG TAB] 60 tablet 1    Sig: TAKE 1 TO 2 TABLETS BY MOUTH 3 TIMES DAILY AS NEEDED FOR MUSCLE SPASM     Not Delegated - Analgesics:  Muscle Relaxants Failed - 07/03/2024  1:35 PM      Failed - This refill cannot be delegated      Passed - Valid encounter within last 6 months    Recent Outpatient Visits           1 month ago Diarrhea, unspecified type   [facility] Gareth Clarity F, FNP   1 month ago Moderate episode of recurrent major depressive disorder [facility])   [facility] Leavy Mole, PA-C   2 months ago Myalgia   [facility] Leavy Mole, PA-C   4 months ago Medicare annual wellness visit, subsequent   [facility] Leavy Mole, PA-C   4 months ago Leg cramps   [facility] Gareth Clarity [place], [place]

## 2024-07-10 DIAGNOSIS — R488 Other symbolic dysfunctions: Secondary | ICD-10-CM | POA: Diagnosis not present

## 2024-07-14 DIAGNOSIS — R488 Other symbolic dysfunctions: Secondary | ICD-10-CM | POA: Diagnosis not present

## 2024-07-15 DIAGNOSIS — R488 Other symbolic dysfunctions: Secondary | ICD-10-CM | POA: Diagnosis not present

## 2024-08-22 ENCOUNTER — Telehealth: Payer: Self-pay | Admitting: Family Medicine

## 2024-08-22 NOTE — Telephone Encounter (Signed)
 busPIRone  (BUSPAR ) 5 MG tablet

## 2024-08-22 NOTE — Telephone Encounter (Signed)
"  Lvm for pt to return call to schedule appt  "

## 2024-09-01 ENCOUNTER — Ambulatory Visit: Admitting: Family Medicine

## 2024-09-05 ENCOUNTER — Ambulatory Visit: Payer: Self-pay | Admitting: *Deleted

## 2024-09-05 NOTE — Telephone Encounter (Signed)
 Attempted to call patient regarding symptoms- x2- no answer- left message to call office   Copied from CRM [phone] [id number]. Topic: Clinical - Medical Advice >> Sep 05, 2024  1:32 PM Myrick T wrote: Reason for CRM: patient called said she has been experiencing headache, body aches and just not feeling herself. New patient appt on 3/5. Please f/u with patient   ----------------------------------------------------------------------- From previous Reason for Contact - Red Word Triage: Red Word that prompted transfer to Nurse Triage:   Please warm transfer Red Word call to Nurse Triage and then click Send Message and Close CRM.

## 2024-09-05 NOTE — Telephone Encounter (Signed)
 Second attempt to reach patient- no answer- call back message left.

## 2024-09-05 NOTE — Telephone Encounter (Signed)
 This Triage RN made 3rd attempt to reach patient, no answer, went to voicemail.  Left voicemail requesting call back and will route high-priority to PCP office.     Copied from CRM [id number]. Topic: Clinical - Medical Advice >> Sep 05, 2024  1:32 PM Myrick T wrote: Reason for CRM: patient called said she has been experiencing headache, body aches and just not feeling herself. New patient appt on 3/5. Please f/u with patient   ----------------------------------------------------------------------- From previous Reason for Contact - Red Word Triage: Red Word that prompted transfer to Nurse Triage:   Please warm transfer Red Word call to Nurse Triage and then click Send Message and Close CRM.

## 2024-09-05 NOTE — Telephone Encounter (Signed)
 FYI Only or Action Required?: FYI only for provider: appointment scheduled on 1/20.  Patient was last seen in primary care on 05/30/2024 by Gareth Mliss FALCON, FNP.  Called Nurse Triage reporting Headache and Generalized Body Aches (/).  Symptoms began several days ago.  Interventions attempted: OTC medications: ibuprofen, flonase .  Symptoms are: gradually worsening.  Triage Disposition: See PCP Within 2 Weeks  Patient/caregiver understands and will follow disposition?: Yes    4/10 headaches and generalized body aches x4-5 days. Mild nasal congestion. No cough. No fever. No recent covid and flu exposure. Up to date on these immunizations. Scheduled appt with different provider at home office d/t PCP no longer being at the practice. Advised UC or ED for worsening symptoms.     Copied from CRM [phone] [phone]. Topic: Clinical - Medical Advice >> Sep 05, 2024  1:32 PM Myrick T wrote: Reason for CRM: patient called said she has been experiencing headache, body aches and just not feeling herself. New patient appt on 3/5. Please f/u with patient   ----------------------------------------------------------------------- From previous Reason for Contact - Red Word Triage: Red Word that prompted transfer to Nurse Triage:   Please warm transfer Red Word call to Nurse Triage and then click Send Message and Close CRM. Reason for Disposition  [1] MILD pain (e.g., does not interfere with normal activities) AND [2] present > 7 days    5 days  Answer Assessment - Initial Assessment Questions 1. ONSET: When did the muscle aches or body pains start?      4-5 days  2. LOCATION: What part of your body is hurting? (e.g., entire body, arms, legs)      Generalized body aches and headache  3. SEVERITY: How bad is the pain? (Scale 1-10; or mild, moderate, severe)     Mild general malaise/aches  4. CAUSE: What do you think is causing the pains?     Unsure  5. FEVER: Do you have a fever? If  Yes, ask: What is your temperature, how was it measured, and  when did it start?      Denies  6. OTHER SYMPTOMS: Do you have any other symptoms? (e.g., chest pain, cold or flu symptoms, rash, weakness, weight loss)     Fatigue, nasal congestion mild  Protocols used: Muscle Aches and Body Pain-A-AH

## 2024-09-09 ENCOUNTER — Ambulatory Visit: Admitting: Family Medicine

## 2024-10-23 ENCOUNTER — Encounter: Admitting: Family Medicine

## 2024-11-05 ENCOUNTER — Encounter: Admitting: Internal Medicine
